# Patient Record
Sex: Female | Born: 1935 | Race: White | Hispanic: No | Marital: Married | Smoking: Never smoker
Health system: Southern US, Community
[De-identification: ages and names within clinical notes are randomized; demographics above are authoritative.]

## PROBLEM LIST (undated history)

## (undated) DIAGNOSIS — S7290XA Unspecified fracture of unspecified femur, initial encounter for closed fracture: Secondary | ICD-10-CM

## (undated) DIAGNOSIS — J449 Chronic obstructive pulmonary disease, unspecified: Secondary | ICD-10-CM

## (undated) DIAGNOSIS — M889 Osteitis deformans of unspecified bone: Secondary | ICD-10-CM

## (undated) DIAGNOSIS — D510 Vitamin B12 deficiency anemia due to intrinsic factor deficiency: Secondary | ICD-10-CM

## (undated) DIAGNOSIS — K439 Ventral hernia without obstruction or gangrene: Secondary | ICD-10-CM

## (undated) DIAGNOSIS — S72002A Fracture of unspecified part of neck of left femur, initial encounter for closed fracture: Secondary | ICD-10-CM

## (undated) DIAGNOSIS — N289 Disorder of kidney and ureter, unspecified: Secondary | ICD-10-CM

## (undated) DIAGNOSIS — C50019 Malignant neoplasm of nipple and areola, unspecified female breast: Secondary | ICD-10-CM

## (undated) DIAGNOSIS — M810 Age-related osteoporosis without current pathological fracture: Secondary | ICD-10-CM

## (undated) DIAGNOSIS — Z8639 Personal history of other endocrine, nutritional and metabolic disease: Secondary | ICD-10-CM

## (undated) DIAGNOSIS — J189 Pneumonia, unspecified organism: Secondary | ICD-10-CM

## (undated) DIAGNOSIS — E559 Vitamin D deficiency, unspecified: Secondary | ICD-10-CM

## (undated) DIAGNOSIS — C189 Malignant neoplasm of colon, unspecified: Secondary | ICD-10-CM

## (undated) DIAGNOSIS — G20A1 Parkinson's disease without dyskinesia, without mention of fluctuations: Secondary | ICD-10-CM

## (undated) DIAGNOSIS — G20 Parkinson's disease: Secondary | ICD-10-CM

## (undated) DIAGNOSIS — J479 Bronchiectasis, uncomplicated: Secondary | ICD-10-CM

## (undated) DIAGNOSIS — F0390 Unspecified dementia without behavioral disturbance: Secondary | ICD-10-CM

## (undated) DIAGNOSIS — C801 Malignant (primary) neoplasm, unspecified: Secondary | ICD-10-CM

## (undated) HISTORY — DX: Malignant neoplasm of nipple and areola, unspecified female breast: C50.019

## (undated) HISTORY — DX: Unspecified dementia, unspecified severity, without behavioral disturbance, psychotic disturbance, mood disturbance, and anxiety: F03.90

## (undated) HISTORY — DX: Pneumonia, unspecified organism: J18.9

## (undated) HISTORY — DX: Personal history of other endocrine, nutritional and metabolic disease: Z86.39

## (undated) HISTORY — DX: Fracture of unspecified part of neck of left femur, initial encounter for closed fracture: S72.002A

## (undated) HISTORY — DX: Bronchiectasis, uncomplicated: J47.9

## (undated) HISTORY — DX: Malignant (primary) neoplasm, unspecified: C80.1

## (undated) HISTORY — DX: Age-related osteoporosis without current pathological fracture: M81.0

## (undated) HISTORY — DX: Vitamin D deficiency, unspecified: E55.9

## (undated) HISTORY — PX: COLON SURGERY: SHX602

## (undated) HISTORY — DX: Ventral hernia without obstruction or gangrene: K43.9

## (undated) HISTORY — DX: Unspecified fracture of unspecified femur, initial encounter for closed fracture: S72.90XA

## (undated) HISTORY — PX: BREAST SURGERY: SHX581

## (undated) HISTORY — DX: Parkinson's disease without dyskinesia, without mention of fluctuations: G20.A1

## (undated) HISTORY — DX: Vitamin B12 deficiency anemia due to intrinsic factor deficiency: D51.0

## (undated) HISTORY — DX: Parkinson's disease: G20

---

## 2006-10-31 ENCOUNTER — Encounter: Admission: RE | Admit: 2006-10-31 | Discharge: 2006-12-02 | Payer: Self-pay | Admitting: Internal Medicine

## 2006-12-03 ENCOUNTER — Encounter: Admission: RE | Admit: 2006-12-03 | Discharge: 2006-12-30 | Payer: Self-pay | Admitting: Internal Medicine

## 2006-12-31 ENCOUNTER — Encounter: Admission: RE | Admit: 2006-12-31 | Discharge: 2007-02-27 | Payer: Self-pay | Admitting: Internal Medicine

## 2007-04-22 ENCOUNTER — Encounter: Admission: RE | Admit: 2007-04-22 | Discharge: 2007-07-21 | Payer: Self-pay | Admitting: Neurology

## 2007-08-14 ENCOUNTER — Encounter: Admission: RE | Admit: 2007-08-14 | Discharge: 2007-08-14 | Payer: Self-pay | Admitting: Neurology

## 2008-09-16 ENCOUNTER — Encounter: Admission: RE | Admit: 2008-09-16 | Discharge: 2008-09-16 | Payer: Self-pay | Admitting: Family Medicine

## 2009-06-28 ENCOUNTER — Encounter: Admission: RE | Admit: 2009-06-28 | Discharge: 2009-07-15 | Payer: Self-pay | Admitting: Family Medicine

## 2009-07-15 ENCOUNTER — Encounter: Admission: RE | Admit: 2009-07-15 | Discharge: 2009-08-24 | Payer: Self-pay | Admitting: Family Medicine

## 2012-01-25 ENCOUNTER — Other Ambulatory Visit: Payer: Self-pay | Admitting: Family Medicine

## 2012-01-25 DIAGNOSIS — Z780 Asymptomatic menopausal state: Secondary | ICD-10-CM

## 2012-01-25 DIAGNOSIS — Z1231 Encounter for screening mammogram for malignant neoplasm of breast: Secondary | ICD-10-CM

## 2012-02-16 ENCOUNTER — Ambulatory Visit: Payer: Self-pay

## 2012-02-16 ENCOUNTER — Other Ambulatory Visit: Payer: Self-pay

## 2012-02-28 ENCOUNTER — Other Ambulatory Visit: Payer: Self-pay | Admitting: Family Medicine

## 2012-02-28 DIAGNOSIS — N63 Unspecified lump in unspecified breast: Secondary | ICD-10-CM

## 2012-02-29 ENCOUNTER — Other Ambulatory Visit: Payer: Self-pay

## 2012-02-29 ENCOUNTER — Ambulatory Visit: Payer: Self-pay

## 2012-03-07 ENCOUNTER — Ambulatory Visit
Admission: RE | Admit: 2012-03-07 | Discharge: 2012-03-07 | Disposition: A | Discharge status: Home / Self Care | Payer: Medicare Other | Source: Ambulatory Visit | Attending: Family Medicine | Admitting: Family Medicine

## 2012-03-07 ENCOUNTER — Other Ambulatory Visit: Payer: Self-pay

## 2012-03-07 DIAGNOSIS — Z780 Asymptomatic menopausal state: Secondary | ICD-10-CM

## 2012-03-07 DIAGNOSIS — N63 Unspecified lump in unspecified breast: Secondary | ICD-10-CM

## 2013-01-15 DIAGNOSIS — Z8639 Personal history of other endocrine, nutritional and metabolic disease: Secondary | ICD-10-CM | POA: Insufficient documentation

## 2013-01-15 HISTORY — DX: Personal history of other endocrine, nutritional and metabolic disease: Z86.39

## 2013-03-02 DIAGNOSIS — K439 Ventral hernia without obstruction or gangrene: Secondary | ICD-10-CM | POA: Insufficient documentation

## 2013-03-02 HISTORY — DX: Ventral hernia without obstruction or gangrene: K43.9

## 2013-03-21 ENCOUNTER — Encounter: Payer: Self-pay | Admitting: *Deleted

## 2013-03-21 ENCOUNTER — Encounter: Payer: Self-pay | Admitting: Critical Care Medicine

## 2013-03-22 ENCOUNTER — Emergency Department (HOSPITAL_COMMUNITY)
Admission: EM | Admit: 2013-03-22 | Discharge: 2013-03-22 | Disposition: A | Discharge status: Home / Self Care | Payer: Medicare Other | Attending: Emergency Medicine | Admitting: Emergency Medicine

## 2013-03-22 ENCOUNTER — Encounter (HOSPITAL_COMMUNITY): Payer: Self-pay

## 2013-03-22 ENCOUNTER — Emergency Department (HOSPITAL_COMMUNITY): Payer: Medicare Other

## 2013-03-22 DIAGNOSIS — J449 Chronic obstructive pulmonary disease, unspecified: Secondary | ICD-10-CM | POA: Insufficient documentation

## 2013-03-22 DIAGNOSIS — Z8719 Personal history of other diseases of the digestive system: Secondary | ICD-10-CM | POA: Insufficient documentation

## 2013-03-22 DIAGNOSIS — Z862 Personal history of diseases of the blood and blood-forming organs and certain disorders involving the immune mechanism: Secondary | ICD-10-CM | POA: Insufficient documentation

## 2013-03-22 DIAGNOSIS — Z859 Personal history of malignant neoplasm, unspecified: Secondary | ICD-10-CM | POA: Insufficient documentation

## 2013-03-22 DIAGNOSIS — F0280 Dementia in other diseases classified elsewhere without behavioral disturbance: Secondary | ICD-10-CM | POA: Insufficient documentation

## 2013-03-22 DIAGNOSIS — Z79899 Other long term (current) drug therapy: Secondary | ICD-10-CM | POA: Insufficient documentation

## 2013-03-22 DIAGNOSIS — R531 Weakness: Secondary | ICD-10-CM

## 2013-03-22 DIAGNOSIS — Z8639 Personal history of other endocrine, nutritional and metabolic disease: Secondary | ICD-10-CM | POA: Insufficient documentation

## 2013-03-22 DIAGNOSIS — R5383 Other fatigue: Secondary | ICD-10-CM

## 2013-03-22 DIAGNOSIS — IMO0002 Reserved for concepts with insufficient information to code with codable children: Secondary | ICD-10-CM | POA: Insufficient documentation

## 2013-03-22 DIAGNOSIS — J4489 Other specified chronic obstructive pulmonary disease: Secondary | ICD-10-CM | POA: Insufficient documentation

## 2013-03-22 DIAGNOSIS — G20A1 Parkinson's disease without dyskinesia, without mention of fluctuations: Secondary | ICD-10-CM | POA: Insufficient documentation

## 2013-03-22 DIAGNOSIS — R5381 Other malaise: Secondary | ICD-10-CM | POA: Insufficient documentation

## 2013-03-22 DIAGNOSIS — M810 Age-related osteoporosis without current pathological fracture: Secondary | ICD-10-CM | POA: Insufficient documentation

## 2013-03-22 DIAGNOSIS — G20 Parkinson's disease: Secondary | ICD-10-CM | POA: Insufficient documentation

## 2013-03-22 HISTORY — DX: Chronic obstructive pulmonary disease, unspecified: J44.9

## 2013-03-22 LAB — BASIC METABOLIC PANEL
BUN: 17 mg/dL (ref 6–23)
Calcium: 8.9 mg/dL (ref 8.4–10.5)
GFR calc non Af Amer: 60 mL/min — ABNORMAL LOW (ref >90)
Glucose, Bld: 102 mg/dL — ABNORMAL HIGH (ref 70–99)

## 2013-03-22 LAB — URINALYSIS, ROUTINE W REFLEX MICROSCOPIC
Bilirubin Urine: NEGATIVE
Hgb urine dipstick: NEGATIVE
Specific Gravity, Urine: 1.019 (ref 1.005–1.030)
Urobilinogen, UA: 0.2 mg/dL (ref 0.0–1.0)
pH: 6 (ref 5.0–8.0)

## 2013-03-22 LAB — CBC WITH DIFFERENTIAL/PLATELET
Eosinophils Absolute: 0 10*3/uL (ref 0.0–0.7)
Eosinophils Relative: 0 % (ref 0–5)
Hemoglobin: 11.2 g/dL — ABNORMAL LOW (ref 12.0–15.0)
Lymphs Abs: 1 10*3/uL (ref 0.7–4.0)
MCH: 28.1 pg (ref 26.0–34.0)
MCV: 85.4 fL (ref 78.0–100.0)
Monocytes Relative: 9 % (ref 3–12)
RBC: 3.98 MIL/uL (ref 3.87–5.11)

## 2013-03-22 NOTE — ED Notes (Signed)
Patient transported to X-ray 

## 2013-03-22 NOTE — ED Notes (Signed)
Pt escorted to discharge window. Verbalized understanding discharge instructions. In no acute distress. Vitals reviewed and WDL.  

## 2013-03-22 NOTE — ED Notes (Signed)
Bed: WA21 Expected date:  Expected time:  Means of arrival:  Comments: Room 21 AMS

## 2013-03-22 NOTE — ED Provider Notes (Signed)
CSN: [id number]     Arrival date & time 03/22/13  1305 History   First MD Initiated Contact with Patient 03/22/13 1402     Chief Complaint  Patient presents with  . Altered Mental Status   (Consider location/radiation/quality/duration/timing/severity/associated sxs/prior Treatment) HPI Comments: Patient with a history of Parkinson's Disease presents today with a chief complaint of fatigue.  Her husband reports that she has been feeling fatigued daily every morning for the past 2-3 weeks.  He reports that usually her fatigue improves around noon.  However, today her fatigue and generalized weakness continued.  Husband reports that he has not noticed any confusion.  Patient denies any pain at this time.  Patient denies headache.  No focal weakness.  No numbness or tingling.  No difficulty speaking or difficulty swallowing.  No nausea, vomiting, or diarrhea.  No fever or chills.  She has had an occasional cough for the past several weeks.  She denies SOB or CP.  She is currently followed by Neurology for her Parkinson's.  She is currently on Stalevo, which husband feels may be contributing to her fatigue.    The history is provided by the patient.    Past Medical History  Diagnosis Date  . Cancer   . Parkinson's disease   . Osteoporosis   . Vitamin D deficiency   . Dementia   . Ventral hernia   . Pernicious anemia   . COPD (chronic obstructive pulmonary disease)    Past Surgical History  Procedure Laterality Date  . Colon surgery      bowel resection  . Breast surgery Left    History reviewed. No pertinent family history. History  Substance Use Topics  . Smoking status: Never Smoker   . Smokeless tobacco: Never Used  . Alcohol Use: Yes     Comment: occ   OB History   Grav Para Term Preterm Abortions TAB SAB Ect Mult Living                 Review of Systems  Constitutional: Positive for fatigue.  Neurological:       Generalized weakness  All other systems reviewed and are  negative.    Allergies  Iodine; Ivp dye; and Shellfish allergy  Home Medications   Current Outpatient Rx  Name  Route  Sig  Dispense  Refill  . albuterol (PROVENTIL) (2.5 MG/3ML) 0.083% nebulizer solution   Nebulization   Take 2.5 mg by nebulization every 6 (six) hours as needed for wheezing.         . budesonide (PULMICORT) 0.5 MG/2ML nebulizer solution   Nebulization   Take 0.5 mg by nebulization 2 (two) times daily.          . carbidopa-levodopa-entacapone (STALEVO) 25-100-200 MG per tablet   Oral   Take 1 tablet by mouth 4 (four) times daily.         Marland Kitchen ENSURE PLUS (ENSURE PLUS) LIQD   Oral   Take 237 mLs by mouth daily.         . naproxen sodium (ANAPROX) 220 MG tablet   Oral   Take 220 mg by mouth daily as needed (for pain).         . rasagiline (AZILECT) 1 MG TABS tablet   Oral   Take 1 mg by mouth every morning.         . Rivastigmine (EXELON) 13.3 MG/24HR PT24   Transdermal   Place 1 patch onto the skin daily.  BP 185/73  Pulse 97  Temp(Src) 98.3 F (36.8 C) (Oral)  Resp 16  SpO2 94% Physical Exam  Nursing note and vitals reviewed. Constitutional: She is oriented to person, place, and time. She appears well-developed and well-nourished. No distress.  HENT:  Head: Normocephalic and atraumatic.  Mouth/Throat: Oropharynx is clear and moist.  Eyes: EOM are normal. Pupils are equal, round, and reactive to light.  Neck: Normal range of motion. Neck supple.  Cardiovascular: Normal rate, regular rhythm and normal heart sounds.   Pulmonary/Chest: Effort normal and breath sounds normal. No respiratory distress. She has no wheezes. She has no rales.  Abdominal: Soft. Bowel sounds are normal. She exhibits no distension and no mass. There is no tenderness. There is no rebound and no guarding.  Umbilical hernia, easily reducible.  Musculoskeletal: Normal range of motion.  Neurological: She is alert and oriented to person, place, and time. She  has normal strength. No cranial nerve deficit or sensory deficit.  Skin: Skin is warm and dry. She is not diaphoretic.  Psychiatric: She has a normal mood and affect.    ED Course  Procedures (including critical care time) Labs Review Labs Reviewed  URINALYSIS, ROUTINE W REFLEX MICROSCOPIC - Abnormal; Notable for the following:    Color, Urine AMBER (*)    All other components within normal limits  CBC WITH DIFFERENTIAL  BASIC METABOLIC PANEL   Imaging Review Dg Chest 2 View  03/22/2013   *RADIOLOGY REPORT*  Clinical Data: Cough.  Altered mental status.  CHEST - 2 VIEW  Comparison: Chest x-ray 01/30/2013.  Findings: Lung volumes appear increased.  Pruning of the pulmonary vasculature in the periphery, suggestive of underlying COPD. Coarse interstitial markings throughout the lungs bilaterally, particularly in the lung bases, similar to prior studies, favored to reflect chronic bronchitis and probable bibasilar scarring.  No definite acute consolidative airspace disease.  No pleural effusions.  No evidence of pulmonary edema.  Heart size appears mildly enlarged.  Upper mediastinal contours are within normal limits.  Atherosclerosis in the thoracic aorta.  IMPRESSION: 1.  Chronic changes of COPD redemonstrated with chronic bibasilar scarring.  No definite radiographic evidence of acute cardiopulmonary disease. 2.  Atherosclerosis.   Original Report Authenticated By: Trudie Reed, M.D.     Date: 03/22/2013  Rate: 101  Rhythm: sinus tachycardia  QRS Axis: normal  Intervals: normal  ST/T Wave abnormalities: nonspecific T wave changes  Conduction Disutrbances:none  Narrative Interpretation:   Old EKG Reviewed: none available  Patient discussed with Dr. Rubin Payor who also evaluated the patient.  Troponin not crossing over in Epic.  Checked with lab.  Troponin 0.1.    4:30 PM Reassessed patient.  She reports that her fatigue and generalized weakness has improved at this time.  Patient  sat up in bed without difficulty.    MDM  No diagnosis found. Patient with a history of Parkinson's presenting with generalized weakness and fatigue, which has been occurring daily in the morning for the past 2-3 weeks.  On exam no focal neurological deficits.  Patient afebrile.  VSS.  No ischemic changes on EKG.  Troponin negative.  CXR negative.  UA negative. Labs unremarkable.  Symptoms improved in the ED.  Feel that the patient is stable for discharge.  Patient instructed to follow up with her Neurologist.  Return precautions given.      Pascal Lux Carnuel, PA-C 03/23/13 2159

## 2013-03-22 NOTE — ED Notes (Addendum)
Per PA and MD, Pt took Stalevo(home medication).

## 2013-03-22 NOTE — ED Notes (Addendum)
Per EMS, Pt, from home, presents with altered LOC.  Pt's family sts episodes of unresponsiveness x 2 weeks.  Family sts today's episode "lasted longer than normal."  Vitals are stable.  A & Ox2.  Hx of Parkinson's and Dementia.  Recently diagnosed with COPD.        Pt sts chronic generalized pain from Parkinson's disease.  Pt take Stalevo every 6hrs.  Pt's husband sts Pt has been becoming very lethargic around 10am x 2 weeks and this normally only lasts and hour, but today "she has not come out of it."

## 2013-03-24 ENCOUNTER — Ambulatory Visit (INDEPENDENT_AMBULATORY_CARE_PROVIDER_SITE_OTHER): Payer: Medicare Other | Admitting: Critical Care Medicine

## 2013-03-24 ENCOUNTER — Encounter: Payer: Self-pay | Admitting: Critical Care Medicine

## 2013-03-24 VITALS — BP 150/70 | HR 80 | Temp 98.4°F | Ht <= 58 in | Wt 92.0 lb

## 2013-03-24 DIAGNOSIS — E559 Vitamin D deficiency, unspecified: Secondary | ICD-10-CM | POA: Insufficient documentation

## 2013-03-24 DIAGNOSIS — M810 Age-related osteoporosis without current pathological fracture: Secondary | ICD-10-CM | POA: Insufficient documentation

## 2013-03-24 DIAGNOSIS — G20 Parkinson's disease: Secondary | ICD-10-CM | POA: Insufficient documentation

## 2013-03-24 DIAGNOSIS — J479 Bronchiectasis, uncomplicated: Secondary | ICD-10-CM

## 2013-03-24 DIAGNOSIS — D510 Vitamin B12 deficiency anemia due to intrinsic factor deficiency: Secondary | ICD-10-CM | POA: Insufficient documentation

## 2013-03-24 DIAGNOSIS — F0390 Unspecified dementia without behavioral disturbance: Secondary | ICD-10-CM | POA: Insufficient documentation

## 2013-03-24 DIAGNOSIS — J449 Chronic obstructive pulmonary disease, unspecified: Secondary | ICD-10-CM

## 2013-03-24 HISTORY — DX: Bronchiectasis, uncomplicated: J47.9

## 2013-03-24 MED ORDER — FLUTTER DEVI: Status: DC

## 2013-03-24 MED ORDER — COMPRESSOR/NEBULIZER MISC: Status: DC

## 2013-03-24 MED ORDER — BUDESONIDE 0.5 MG/2ML IN SUSP: 0.5000 mg | Freq: Every day | RESPIRATORY_TRACT | Status: DC

## 2013-03-24 MED ORDER — LEVOFLOXACIN 500 MG PO TABS: 500.0000 mg | ORAL_TABLET | Freq: Every day | ORAL | Status: DC

## 2013-03-24 MED ORDER — ALBUTEROL SULFATE (2.5 MG/3ML) 0.083% IN NEBU: INHALATION_SOLUTION | RESPIRATORY_TRACT | Status: DC

## 2013-03-24 NOTE — Progress Notes (Signed)
Subjective:    Patient ID: Shannon Dunn, female    DOB: 10-30-1923, 79 y.o.   MRN: [id number]  HPI Comments: Pt sees [facility] and needs a pulm md.   Dx bronchiectasis lifelong.   Chronic cough>> worse x 2weeks, rx pred. Has had prior episodes of hemoptysis long ago   Cough This is a chronic problem. The current episode started more than 1 year ago. The problem has been gradually worsening. The problem occurs hourly. The cough is productive of sputum and productive of purulent sputum. Associated symptoms include rhinorrhea. Pertinent negatives include no chest pain, chills, fever, headaches, hemoptysis, nasal congestion, postnasal drip, rash, sore throat, shortness of breath or wheezing. She has tried steroid inhaler and a beta-agonist inhaler for the symptoms. The treatment provided moderate relief. Her past medical history is significant for bronchiectasis. There is no history of asthma, bronchitis, COPD, emphysema, environmental allergies or pneumonia.    Past Medical History  Diagnosis Date  . Cancer   . Parkinson's disease   . Osteoporosis   . Vitamin D deficiency   . Dementia   . Ventral hernia   . Pernicious anemia   . COPD (chronic obstructive pulmonary disease)      Family History  Problem Relation Age of Onset  . Heart disease Mother      History   Social History  . Marital Status: Married    Spouse Name: N/A    Number of Children: N/A  . Years of Education: N/A   Occupational History  . Retired     [place]   Social History Main Topics  . Smoking status: Never Smoker   . Smokeless tobacco: Never Used  . Alcohol Use: Yes     Comment: glass of wine maybe once a week  . Drug Use: No  . Sexual Activity: Not on file   Other Topics Concern  . Not on file   Social History Narrative  . No narrative on file     Allergies  Allergen Reactions  . Iodine Other (See Comments)    Unknown, might be rash/hives  . Ivp Dye [Iodinated Diagnostic Agents] Other  (See Comments)    Unknown rash/hives maybe  . Shellfish Allergy Other (See Comments)    Unknown rash/hives, scallops      Outpatient Prescriptions Prior to Visit  Medication Sig Dispense Refill  . carbidopa-levodopa-entacapone (STALEVO) 25-100-200 MG per tablet Take 1 tablet by mouth 4 (four) times daily.      Marland Kitchen ENSURE PLUS (ENSURE PLUS) LIQD Take 237 mLs by mouth daily.      . naproxen sodium (ANAPROX) 220 MG tablet Take 220 mg by mouth daily as needed (for pain).      . rasagiline (AZILECT) 1 MG TABS tablet Take 1 mg by mouth every morning.      . Rivastigmine (EXELON) 13.3 MG/24HR PT24 Place 1 patch onto the skin daily.      Marland Kitchen albuterol (PROVENTIL) (2.5 MG/3ML) 0.083% nebulizer solution Take 2.5 mg by nebulization every 6 (six) hours as needed for wheezing.      . budesonide (PULMICORT) 0.5 MG/2ML nebulizer solution Take 0.5 mg by nebulization. 1-2 times daily       No facility-administered medications prior to visit.      Review of Systems  Constitutional: Positive for diaphoresis, activity change and unexpected weight change. Negative for fever, chills and fatigue.  HENT: Positive for congestion, rhinorrhea and voice change. Negative for hearing loss, nosebleeds, sore throat, facial swelling, mouth sores,  neck pain, neck stiffness, dental problem, postnasal drip, sinus pressure, tinnitus and ear discharge.   Eyes: Negative for photophobia, discharge, itching and visual disturbance.  Respiratory: Positive for cough. Negative for apnea, hemoptysis, choking, chest tightness, shortness of breath, wheezing and stridor.   Cardiovascular: Positive for leg swelling. Negative for chest pain and palpitations.  Gastrointestinal: Negative for nausea, vomiting, abdominal pain, constipation, blood in stool and abdominal distention.       No coughing after eating No heartburn  Genitourinary: Negative for dysuria, urgency, frequency, hematuria, flank pain, decreased urine volume and difficulty  urinating.  Musculoskeletal: Positive for gait problem. Negative for back pain, joint swelling and arthralgias.  Skin: Negative for color change, pallor and rash.  Allergic/Immunologic: Negative for environmental allergies.  Neurological: Positive for dizziness, weakness and light-headedness. Negative for tremors, seizures, syncope, speech difficulty, numbness and headaches.  Hematological: Negative for adenopathy. Bruises/bleeds easily.  Psychiatric/Behavioral: Negative for confusion, sleep disturbance and agitation. The patient is not nervous/anxious.        Objective:   Physical Exam Filed Vitals:   03/24/13 1625  BP: 150/70  Pulse: 80  Temp: 98.4 F (36.9 C)  TempSrc: Oral  Height: 4\' 10"  (1.473 m)  Weight: 92 lb (41.731 kg)  SpO2: 96%    Gen: Elderly white female in no distress  ENT: No lesions,  mouth clear,  oropharynx clear, no postnasal drip  Neck: No JVD, no TMG, no carotid bruits  Lungs: No use of accessory muscles, no dullness to percussion, Scattered rhonchi and expired wheezes  Cardiovascular: RRR, heart sounds normal, no murmur or gallops, no peripheral edema  Abdomen: soft and NT, no HSM,  BS normal  Musculoskeletal: No deformities, no cyanosis or clubbing  Neuro: alert, non focal  Skin: Warm, no lesions or rashes  No results found.        Assessment & Plan:   Bronchiectasis without acute exacerbation History of cylindrical bronchiectasis in both lower lobes with associated COPD Acute exacerbation of same Plan Take levaquin 500mg  daily for 7days Stay on budesonide daily along with albuterol daily Start on a flutter valve daily Obtain a sputum culture No other changes We will obtain your own nebulizer and medications for nebulizer Return 2 months    Updated Medication List Outpatient Encounter Prescriptions as of 03/24/2013  Medication Sig Dispense Refill  . albuterol (PROVENTIL) (2.5 MG/3ML) 0.083% nebulizer solution Use once daily and  as needed  120 mL  6  . budesonide (PULMICORT) 0.5 MG/2ML nebulizer solution Take 2 mLs (0.5 mg total) by nebulization daily.  60 mL  6  . carbidopa-levodopa-entacapone (STALEVO) 25-100-200 MG per tablet Take 1 tablet by mouth 4 (four) times daily.      . cyanocobalamin (,VITAMIN B-12,) 1000 MCG/ML injection every 30 (thirty) days.      Marland Kitchen denosumab (PROLIA) 60 MG/ML SOLN injection Inject 60 mg into the skin every 6 (six) months. Administer in upper arm, thigh, or abdomen      . ENSURE PLUS (ENSURE PLUS) LIQD Take 237 mLs by mouth daily.      . naproxen sodium (ANAPROX) 220 MG tablet Take 220 mg by mouth daily as needed (for pain).      . rasagiline (AZILECT) 1 MG TABS tablet Take 1 mg by mouth every morning.      . Rivastigmine (EXELON) 13.3 MG/24HR PT24 Place 1 patch onto the skin daily.      . [DISCONTINUED] albuterol (PROVENTIL) (2.5 MG/3ML) 0.083% nebulizer solution Take 2.5 mg by nebulization  every 6 (six) hours as needed for wheezing.      . [DISCONTINUED] albuterol (PROVENTIL) (2.5 MG/3ML) 0.083% nebulizer solution Use once daily and as needed  120 mL  6  . [DISCONTINUED] budesonide (PULMICORT) 0.5 MG/2ML nebulizer solution Take 0.5 mg by nebulization. 1-2 times daily      . [DISCONTINUED] budesonide (PULMICORT) 0.5 MG/2ML nebulizer solution Take 2 mLs (0.5 mg total) by nebulization daily. 1-2 times daily  60 mL  6  . levofloxacin (LEVAQUIN) 500 MG tablet Take 1 tablet (500 mg total) by mouth daily.  7 tablet  0  . Nebulizers (COMPRESSOR/NEBULIZER) MISC Use with albuterol/budesonide  1 each  0  . Respiratory Therapy Supplies (FLUTTER) DEVI Use 4 times daily  1 each  0  . [DISCONTINUED] Nebulizers (COMPRESSOR/NEBULIZER) MISC Use with albuterol/budesonide  1 each  0   No facility-administered encounter medications on file as of 03/24/2013.

## 2013-03-24 NOTE — Patient Instructions (Addendum)
Take levaquin 500mg  daily for 7days Stay on budesonide daily along with albuterol daily Start on a flutter valve daily Obtain a sputum culture No other changes We will obtain your own nebulizer and medications for nebulizer Return 2 months

## 2013-03-25 ENCOUNTER — Other Ambulatory Visit: Payer: Self-pay | Admitting: Critical Care Medicine

## 2013-03-25 DIAGNOSIS — J449 Chronic obstructive pulmonary disease, unspecified: Secondary | ICD-10-CM

## 2013-03-25 NOTE — Assessment & Plan Note (Signed)
History of cylindrical bronchiectasis in both lower lobes with associated COPD Acute exacerbation of same Plan Take levaquin 500mg  daily for 7days Stay on budesonide daily along with albuterol daily Start on a flutter valve daily Obtain a sputum culture No other changes We will obtain your own nebulizer and medications for nebulizer Return 2 months

## 2013-03-26 NOTE — ED Provider Notes (Signed)
Medical screening examination/treatment/procedure(s) were performed by non-physician practitioner and as supervising physician I was immediately available for consultation/collaboration.  Timo Hartwig R. Javian Nudd, MD 03/26/13 2346 

## 2013-05-12 ENCOUNTER — Ambulatory Visit (INDEPENDENT_AMBULATORY_CARE_PROVIDER_SITE_OTHER): Payer: Medicare Other | Admitting: Critical Care Medicine

## 2013-05-12 ENCOUNTER — Encounter: Payer: Self-pay | Admitting: Critical Care Medicine

## 2013-05-12 VITALS — BP 128/64 | HR 87 | Temp 98.0°F | Ht <= 58 in | Wt 91.0 lb

## 2013-05-12 DIAGNOSIS — J479 Bronchiectasis, uncomplicated: Secondary | ICD-10-CM

## 2013-05-12 MED ORDER — BUDESONIDE 0.5 MG/2ML IN SUSP: 0.5000 mg | Freq: Every day | RESPIRATORY_TRACT | Status: DC

## 2013-05-12 NOTE — Patient Instructions (Signed)
Use flutter valve 3- 4 times daily Stay on nebulizer medications Return 4 months

## 2013-05-13 NOTE — Progress Notes (Signed)
Subjective:    Patient ID: Shannon Dunn, female    DOB: 17-Jun-1924, 79 y.o.   MRN: [id number]  HPI Comments: Pt sees [facility] and needs a pulm md.   Dx bronchiectasis lifelong.   Chronic cough>> worse x 2weeks, rx pred. Has had prior episodes of hemoptysis long ago   05/13/2013  Chief Complaint  Patient presents with  . 2 month follow up    Breathing has improved.  Not coughing as much - prod at times with yellowish to green mucus.  No wheezing, chest tightness, or chest pain.  No new changes are noted the patient does have some difficulty raising secretions but is coughing less and breathing has improved on inhaled medication program is been no further hemoptysis  Past Medical History  Diagnosis Date  . Cancer   . Parkinson's disease   . Osteoporosis   . Vitamin D deficiency   . Dementia   . Ventral hernia   . Pernicious anemia   . COPD (chronic obstructive pulmonary disease)      Family History  Problem Relation Age of Onset  . Heart disease Mother      History   Social History  . Marital Status: Married    Spouse Name: N/A    Number of Children: N/A  . Years of Education: N/A   Occupational History  . Retired     [place]   Social History Main Topics  . Smoking status: Never Smoker   . Smokeless tobacco: Never Used  . Alcohol Use: Yes     Comment: glass of wine maybe once a week  . Drug Use: No  . Sexual Activity: Not on file   Other Topics Concern  . Not on file   Social History Narrative  . No narrative on file     Allergies  Allergen Reactions  . Iodine Other (See Comments)    Unknown, might be rash/hives  . Ivp Dye [Iodinated Diagnostic Agents] Other (See Comments)    Unknown rash/hives maybe  . Shellfish Allergy Other (See Comments)    Unknown rash/hives, scallops      Outpatient Prescriptions Prior to Visit  Medication Sig Dispense Refill  . albuterol (PROVENTIL) (2.5 MG/3ML) 0.083% nebulizer solution Use once daily and as needed   120 mL  6  . carbidopa-levodopa-entacapone (STALEVO) 25-100-200 MG per tablet Take 1 tablet by mouth 4 (four) times daily.      . cyanocobalamin (,VITAMIN B-12,) 1000 MCG/ML injection every 30 (thirty) days.      Marland Kitchen denosumab (PROLIA) 60 MG/ML SOLN injection Inject 60 mg into the skin every 6 (six) months. Administer in upper arm, thigh, or abdomen      . ENSURE PLUS (ENSURE PLUS) LIQD Take 237 mLs by mouth daily.      . naproxen sodium (ANAPROX) 220 MG tablet Take 220 mg by mouth daily as needed (for pain).      . Nebulizers (COMPRESSOR/NEBULIZER) MISC Use with albuterol/budesonide  1 each  0  . rasagiline (AZILECT) 1 MG TABS tablet Take 1 mg by mouth every morning.      . Rivastigmine (EXELON) 13.3 MG/24HR PT24 Place 1 patch onto the skin daily.      . budesonide (PULMICORT) 0.5 MG/2ML nebulizer solution Take 2 mLs (0.5 mg total) by nebulization daily.  60 mL  6  . Respiratory Therapy Supplies (FLUTTER) DEVI Use 4 times daily  1 each  0  . levofloxacin (LEVAQUIN) 500 MG tablet Take 1 tablet (500 mg  total) by mouth daily.  7 tablet  0   No facility-administered medications prior to visit.      Review of Systems  Constitutional: Positive for diaphoresis, activity change and unexpected weight change. Negative for fatigue.  HENT: Positive for congestion and voice change. Negative for dental problem, ear discharge, facial swelling, hearing loss, mouth sores, nosebleeds, sinus pressure and tinnitus.   Eyes: Negative for photophobia, discharge, itching and visual disturbance.  Respiratory: Negative for apnea, choking, chest tightness and stridor.   Cardiovascular: Positive for leg swelling. Negative for palpitations.  Gastrointestinal: Negative for nausea, vomiting, abdominal pain, constipation, blood in stool and abdominal distention.       No coughing after eating No heartburn  Genitourinary: Negative for dysuria, urgency, frequency, hematuria, flank pain, decreased urine volume and difficulty  urinating.  Musculoskeletal: Positive for gait problem. Negative for arthralgias, back pain, joint swelling, neck pain and neck stiffness.  Skin: Negative for color change and pallor.  Neurological: Positive for dizziness, weakness and light-headedness. Negative for tremors, seizures, syncope, speech difficulty and numbness.  Hematological: Negative for adenopathy. Bruises/bleeds easily.  Psychiatric/Behavioral: Negative for confusion, sleep disturbance and agitation. The patient is not nervous/anxious.        Objective:   Physical Exam  Filed Vitals:   05/12/13 1614  BP: 128/64  Pulse: 87  Temp: 98 F (36.7 C)  TempSrc: Oral  Height: 4\' 10"  (1.473 m)  Weight: 91 lb (41.277 kg)  SpO2: 96%    Gen: Elderly white female in no distress  ENT: No lesions,  mouth clear,  oropharynx clear, no postnasal drip  Neck: No JVD, no TMG, no carotid bruits  Lungs: No use of accessory muscles, no dullness to percussion, Scattered rhonchi no wheezes Cardiovascular: RRR, heart sounds normal, no murmur or gallops, no peripheral edema  Abdomen: soft and NT, no HSM,  BS normal  Musculoskeletal: No deformities, no cyanosis or clubbing  Neuro: alert, non focal  Skin: Warm, no lesions or rashes  No results found.        Assessment & Plan:   Bronchiectasis without acute exacerbation Bronchiectasis without acute exacerbation Ongoing mucus production Plan Use flutter valve 3- 4 times daily Stay on nebulizer medications Return 4 months     Updated Medication List Outpatient Encounter Prescriptions as of 05/12/2013  Medication Sig Dispense Refill  . albuterol (PROVENTIL) (2.5 MG/3ML) 0.083% nebulizer solution Use once daily and as needed  120 mL  6  . budesonide (PULMICORT) 0.5 MG/2ML nebulizer solution Take 2 mLs (0.5 mg total) by nebulization daily.  60 mL  6  . carbidopa-levodopa-entacapone (STALEVO) 25-100-200 MG per tablet Take 1 tablet by mouth 4 (four) times daily.      .  cyanocobalamin (,VITAMIN B-12,) 1000 MCG/ML injection every 30 (thirty) days.      Marland Kitchen denosumab (PROLIA) 60 MG/ML SOLN injection Inject 60 mg into the skin every 6 (six) months. Administer in upper arm, thigh, or abdomen      . ENSURE PLUS (ENSURE PLUS) LIQD Take 237 mLs by mouth daily.      . naproxen sodium (ANAPROX) 220 MG tablet Take 220 mg by mouth daily as needed (for pain).      . Nebulizers (COMPRESSOR/NEBULIZER) MISC Use with albuterol/budesonide  1 each  0  . rasagiline (AZILECT) 1 MG TABS tablet Take 1 mg by mouth every morning.      . Rivastigmine (EXELON) 13.3 MG/24HR PT24 Place 1 patch onto the skin daily.      Marland Kitchen  Rotigotine (NEUPRO TD) Place onto the skin daily.      . [DISCONTINUED] budesonide (PULMICORT) 0.5 MG/2ML nebulizer solution Take 2 mLs (0.5 mg total) by nebulization daily.  60 mL  6  . Respiratory Therapy Supplies (FLUTTER) DEVI Use 4 times daily  1 each  0  . [DISCONTINUED] levofloxacin (LEVAQUIN) 500 MG tablet Take 1 tablet (500 mg total) by mouth daily.  7 tablet  0   No facility-administered encounter medications on file as of 05/12/2013.

## 2013-05-13 NOTE — Assessment & Plan Note (Signed)
Bronchiectasis without acute exacerbation Ongoing mucus production Plan Use flutter valve 3- 4 times daily Stay on nebulizer medications Return 4 months

## 2013-09-16 ENCOUNTER — Ambulatory Visit: Payer: Medicare Other | Admitting: Critical Care Medicine

## 2013-10-14 ENCOUNTER — Encounter: Payer: Self-pay | Admitting: Critical Care Medicine

## 2013-10-14 ENCOUNTER — Ambulatory Visit (INDEPENDENT_AMBULATORY_CARE_PROVIDER_SITE_OTHER): Payer: Medicare Other | Admitting: Critical Care Medicine

## 2013-10-14 VITALS — BP 100/60 | HR 93 | Temp 97.8°F | Ht 59.0 in | Wt 93.8 lb

## 2013-10-14 DIAGNOSIS — J479 Bronchiectasis, uncomplicated: Secondary | ICD-10-CM

## 2013-10-14 MED ORDER — FLUTICASONE PROPIONATE 50 MCG/ACT NA SUSP: 2.0000 | Freq: Every day | NASAL | Status: DC

## 2013-10-14 NOTE — Progress Notes (Signed)
Subjective:    Patient ID: Shannon Dunn, female    DOB: September 25, 1923, 80 y.o.   MRN: [id number]  HPI Comments: Pt sees Darron Doom and needs a pulm md.   Dx bronchiectasis lifelong.   Chronic cough>> worse x 2weeks, rx pred. Has had prior episodes of hemoptysis long ago   05/13/2013  Chief Complaint  Patient presents with  . 2 month follow up    Breathing has improved.  Not coughing as much - prod at times with yellowish to green mucus.  No wheezing, chest tightness, or chest pain.  No new changes are noted the patient does have some difficulty raising secretions but is coughing less and breathing has improved on inhaled medication program is been no further hemoptysis  10/14/2013 Chief Complaint  Patient presents with  . Follow-up    4 mth f/u - Hoarseness - Prod cough (yellow) - Nasal drainage (yellow) - Occas sob - Throat irritation - Strangles on food  Notes some hoarsness, and some post nasal drip.  Did choke on food, but not frequent.       Past Medical History  Diagnosis Date  . Cancer   . Parkinson's disease   . Osteoporosis   . Vitamin D deficiency   . Dementia   . Ventral hernia   . Pernicious anemia   . COPD (chronic obstructive pulmonary disease)      Family History  Problem Relation Age of Onset  . Heart disease Mother      History   Social History  . Marital Status: Married    Spouse Name: N/A    Number of Children: N/A  . Years of Education: N/A   Occupational History  . Retired     [employer]   Social History Main Topics  . Smoking status: Never Smoker   . Smokeless tobacco: Never Used  . Alcohol Use: Yes     Comment: glass of wine maybe once a week  . Drug Use: No  . Sexual Activity: Not on file   Other Topics Concern  . Not on file   Social History Narrative  . No narrative on file     Allergies  Allergen Reactions  . Iodine Other (See Comments)    Unknown, might be rash/hives  . Ivp Dye [Iodinated Diagnostic Agents] Other (See  Comments)    Unknown rash/hives maybe  . Shellfish Allergy Other (See Comments)    Unknown rash/hives, scallops      Outpatient Prescriptions Prior to Visit  Medication Sig Dispense Refill  . albuterol (PROVENTIL) (2.5 MG/3ML) 0.083% nebulizer solution Use once daily and as needed  120 mL  6  . budesonide (PULMICORT) 0.5 MG/2ML nebulizer solution Take 2 mLs (0.5 mg total) by nebulization daily.  60 mL  6  . carbidopa-levodopa-entacapone (STALEVO) 25-100-200 MG per tablet Take 1 tablet by mouth 4 (four) times daily.      . cyanocobalamin (,VITAMIN B-12,) 1000 MCG/ML injection every 30 (thirty) days.      Marland Kitchen denosumab (PROLIA) 60 MG/ML SOLN injection Inject 60 mg into the skin every 6 (six) months. Administer in upper arm, thigh, or abdomen      . ENSURE PLUS (ENSURE PLUS) LIQD Take 237 mLs by mouth daily.      . Nebulizers (COMPRESSOR/NEBULIZER) MISC Use with albuterol/budesonide  1 each  0  . rasagiline (AZILECT) 1 MG TABS tablet Take 1 mg by mouth every morning.      Marland Kitchen Respiratory Therapy Supplies (FLUTTER) DEVI Use 4  times daily  1 each  0  . Rivastigmine (EXELON) 13.3 MG/24HR PT24 Place 1 patch onto the skin daily.      . Rotigotine (NEUPRO TD) Place onto the skin daily.      . naproxen sodium (ANAPROX) 220 MG tablet Take 220 mg by mouth daily as needed (for pain).       No facility-administered medications prior to visit.      Review of Systems  Constitutional: Positive for diaphoresis, activity change and unexpected weight change. Negative for fatigue.  HENT: Positive for congestion and voice change. Negative for dental problem, ear discharge, facial swelling, hearing loss, mouth sores, nosebleeds, sinus pressure and tinnitus.   Eyes: Negative for photophobia, discharge, itching and visual disturbance.  Respiratory: Negative for apnea, choking, chest tightness and stridor.   Cardiovascular: Positive for leg swelling. Negative for palpitations.  Gastrointestinal: Negative for  nausea, vomiting, abdominal pain, constipation, blood in stool and abdominal distention.       No coughing after eating No heartburn  Genitourinary: Negative for dysuria, urgency, frequency, hematuria, flank pain, decreased urine volume and difficulty urinating.  Musculoskeletal: Positive for gait problem. Negative for arthralgias, back pain, joint swelling, neck pain and neck stiffness.  Skin: Negative for color change and pallor.  Neurological: Positive for dizziness, weakness and light-headedness. Negative for tremors, seizures, syncope, speech difficulty and numbness.  Hematological: Negative for adenopathy. Bruises/bleeds easily.  Psychiatric/Behavioral: Negative for confusion, sleep disturbance and agitation. The patient is not nervous/anxious.        Objective:   Physical Exam  Filed Vitals:   10/14/13 1159  BP: 100/60  Pulse: 93  Temp: 97.8 F (36.6 C)  TempSrc: Oral  Height: 4\' 11"  (1.499 m)  Weight: 93 lb 12.8 oz (42.547 kg)  SpO2: 99%    Gen: Elderly white female in no distress  ENT: No lesions,  mouth clear,  oropharynx clear, no postnasal drip  Neck: No JVD, no TMG, no carotid bruits  Lungs: No use of accessory muscles, no dullness to percussion, Scattered rhonchi no wheezes Cardiovascular: RRR, heart sounds normal, no murmur or gallops, no peripheral edema  Abdomen: soft and NT, no HSM,  BS normal  Musculoskeletal: No deformities, no cyanosis or clubbing  Neuro: alert, non focal  Skin: Warm, no lesions or rashes  No results found.        Assessment & Plan:   Bronchiectasis without acute exacerbation Stable bronchiectasis Cont ICS Use nasal steroid    Updated Medication List Outpatient Encounter Prescriptions as of 10/14/2013  Medication Sig  . albuterol (PROVENTIL) (2.5 MG/3ML) 0.083% nebulizer solution Use once daily and as needed  . budesonide (PULMICORT) 0.5 MG/2ML nebulizer solution Take 2 mLs (0.5 mg total) by nebulization daily.  .  carbidopa-levodopa-entacapone (STALEVO) 25-100-200 MG per tablet Take 1 tablet by mouth 4 (four) times daily.  . cyanocobalamin (,VITAMIN B-12,) 1000 MCG/ML injection every 30 (thirty) days.  Marland Kitchen denosumab (PROLIA) 60 MG/ML SOLN injection Inject 60 mg into the skin every 6 (six) months. Administer in upper arm, thigh, or abdomen  . ENSURE PLUS (ENSURE PLUS) LIQD Take 237 mLs by mouth daily.  . Nebulizers (COMPRESSOR/NEBULIZER) MISC Use with albuterol/budesonide  . rasagiline (AZILECT) 1 MG TABS tablet Take 1 mg by mouth every morning.  Marland Kitchen Respiratory Therapy Supplies (FLUTTER) DEVI Use 4 times daily  . Rivastigmine (EXELON) 13.3 MG/24HR PT24 Place 1 patch onto the skin daily.  . Rotigotine (NEUPRO TD) Place onto the skin daily.  . fluticasone (FLONASE) 50  MCG/ACT nasal spray Place 2 sprays into both nostrils daily.  . naproxen sodium (ANAPROX) 220 MG tablet Take 220 mg by mouth daily as needed (for pain).

## 2013-10-14 NOTE — Patient Instructions (Signed)
Try fluticasone two puff ea nostril daily as needed, sent to pharmacy Stay on budesonide daily by nebulizer Return 6 months

## 2013-10-15 NOTE — Assessment & Plan Note (Signed)
Stable bronchiectasis Cont ICS Use nasal steroid

## 2014-02-22 ENCOUNTER — Encounter (HOSPITAL_COMMUNITY): Payer: Self-pay | Admitting: Emergency Medicine

## 2014-02-22 ENCOUNTER — Emergency Department (HOSPITAL_COMMUNITY)
Admission: EM | Admit: 2014-02-22 | Discharge: 2014-02-22 | Disposition: A | Discharge status: Home / Self Care | Payer: Medicare Other | Attending: Emergency Medicine | Admitting: Emergency Medicine

## 2014-02-22 DIAGNOSIS — J449 Chronic obstructive pulmonary disease, unspecified: Secondary | ICD-10-CM | POA: Insufficient documentation

## 2014-02-22 DIAGNOSIS — M810 Age-related osteoporosis without current pathological fracture: Secondary | ICD-10-CM | POA: Insufficient documentation

## 2014-02-22 DIAGNOSIS — T6391XA Toxic effect of contact with unspecified venomous animal, accidental (unintentional), initial encounter: Secondary | ICD-10-CM | POA: Insufficient documentation

## 2014-02-22 DIAGNOSIS — E559 Vitamin D deficiency, unspecified: Secondary | ICD-10-CM | POA: Insufficient documentation

## 2014-02-22 DIAGNOSIS — IMO0002 Reserved for concepts with insufficient information to code with codable children: Secondary | ICD-10-CM | POA: Insufficient documentation

## 2014-02-22 DIAGNOSIS — IMO0001 Reserved for inherently not codable concepts without codable children: Secondary | ICD-10-CM | POA: Diagnosis not present

## 2014-02-22 DIAGNOSIS — G20 Parkinson's disease: Secondary | ICD-10-CM | POA: Diagnosis not present

## 2014-02-22 DIAGNOSIS — Y929 Unspecified place or not applicable: Secondary | ICD-10-CM | POA: Insufficient documentation

## 2014-02-22 DIAGNOSIS — Z8719 Personal history of other diseases of the digestive system: Secondary | ICD-10-CM | POA: Insufficient documentation

## 2014-02-22 DIAGNOSIS — R4182 Altered mental status, unspecified: Secondary | ICD-10-CM | POA: Diagnosis present

## 2014-02-22 DIAGNOSIS — Z79899 Other long term (current) drug therapy: Secondary | ICD-10-CM | POA: Diagnosis not present

## 2014-02-22 DIAGNOSIS — D510 Vitamin B12 deficiency anemia due to intrinsic factor deficiency: Secondary | ICD-10-CM | POA: Diagnosis not present

## 2014-02-22 DIAGNOSIS — T63484A Toxic effect of venom of other arthropod, undetermined, initial encounter: Secondary | ICD-10-CM

## 2014-02-22 DIAGNOSIS — Z8659 Personal history of other mental and behavioral disorders: Secondary | ICD-10-CM | POA: Diagnosis not present

## 2014-02-22 DIAGNOSIS — J4489 Other specified chronic obstructive pulmonary disease: Secondary | ICD-10-CM | POA: Insufficient documentation

## 2014-02-22 DIAGNOSIS — G20A1 Parkinson's disease without dyskinesia, without mention of fluctuations: Secondary | ICD-10-CM | POA: Insufficient documentation

## 2014-02-22 DIAGNOSIS — Y939 Activity, unspecified: Secondary | ICD-10-CM | POA: Insufficient documentation

## 2014-02-22 DIAGNOSIS — R404 Transient alteration of awareness: Secondary | ICD-10-CM

## 2014-02-22 MED ORDER — PREDNISONE 20 MG PO TABS
20.0000 mg | ORAL_TABLET | ORAL | Status: AC
Start: 2014-02-22 — End: 2014-02-22
  Administered 2014-02-22: 20 mg via ORAL

## 2014-02-22 MED ORDER — PREDNISONE 20 MG PO TABS
20.0000 mg | ORAL_TABLET | Freq: Every day | ORAL | Status: DC
  Filled 2014-02-22: qty 1

## 2014-02-22 MED ORDER — PREDNISONE 20 MG PO TABS: 20.0000 mg | ORAL_TABLET | Freq: Every day | ORAL | Status: DC

## 2014-02-22 NOTE — ED Notes (Signed)
Bed: WA10 Expected date: 02/22/14 Expected time: 4:44 PM Means of arrival: Ambulance Comments: Insect bite, draining, altered mental status after benadryl

## 2014-02-22 NOTE — ED Notes (Signed)
Family at bedside. 

## 2014-02-22 NOTE — ED Notes (Signed)
MD at bedside. 

## 2014-02-22 NOTE — Discharge Instructions (Signed)
Do not use Benadryl. Elevate the right arm is much as possible.    Bee, Wasp, or Hornet Sting Your caregiver has diagnosed you as having an insect sting. An insect sting appears as a red lump in the skin that sometimes has a tiny hole in the center, or it may have a stinger in the center of the wound. The most common stings are from wasps, hornets and bees. Individuals have different reactions to insect stings.  A normal reaction may cause pain, swelling, and redness around the sting site.  A localized allergic reaction may cause swelling and redness that extends beyond the sting site.  A large local reaction may continue to develop over the next 12 to 36 hours.  On occasion, the reactions can be severe (anaphylactic reaction). An anaphylactic reaction may cause wheezing; difficulty breathing; chest pain; fainting; raised, itchy, red patches on the skin; a sick feeling to your stomach (nausea); vomiting; cramping; or diarrhea. If you have had an anaphylactic reaction to an insect sting in the past, you are more likely to have one again. HOME CARE INSTRUCTIONS   With bee stings, a small sac of poison is left in the wound. Brushing across this with something such as a credit card, or anything similar, will help remove this and decrease the amount of the reaction. This same procedure will not help a wasp sting as they do not leave behind a stinger and poison sac.  Apply a cold compress for 10 to 20 minutes every hour for 1 to 2 days, depending on severity, to reduce swelling and itching.  To lessen pain, a paste made of water and baking soda may be rubbed on the bite or sting and left on for 5 minutes.  To relieve itching and swelling, you may use take medication or apply medicated creams or lotions as directed.  Only take over-the-counter or prescription medicines for pain, discomfort, or fever as directed by your caregiver.  Wash the sting site daily with soap and water. Apply antibiotic  ointment on the sting site as directed.  If you suffered a severe reaction:  If you did not require hospitalization, an adult will need to stay with you for 24 hours in case the symptoms return.  You may need to wear a medical bracelet or necklace stating the allergy.  You and your family need to learn when and how to use an anaphylaxis kit or epinephrine injection.  If you have had a severe reaction before, always carry your anaphylaxis kit with you. SEEK MEDICAL CARE IF:   None of the above helps within 2 to 3 days.  The area becomes red, warm, tender, and swollen beyond the area of the bite or sting.  You have an oral temperature above 102 F (38.9 C). SEEK IMMEDIATE MEDICAL CARE IF:  You have symptoms of an allergic reaction which are:  Wheezing.  Difficulty breathing.  Chest pain.  Lightheadedness or fainting.  Itchy, raised, red patches on the skin.  Nausea, vomiting, cramping or diarrhea. ANY OF THESE SYMPTOMS MAY REPRESENT A SERIOUS PROBLEM THAT IS AN EMERGENCY. Do not wait to see if the symptoms will go away. Get medical help right away. Call your local emergency services (911 in [place] DO NOT drive yourself to the hospital. MAKE SURE YOU:   Understand these instructions.  Will watch your condition.  Will get help right away if you are not doing well or get worse. Document Released: 07/03/2005 Document Revised: 09/25/2011 Document Reviewed: 12/18/2009  ExitCare® Patient Information ©2015 [employer]. This information is not intended to replace advice given to you by your health care provider. Make sure you discuss any questions you have with your health care provider. ° °

## 2014-02-22 NOTE — ED Provider Notes (Signed)
CSN: [id number]     Arrival date & time 02/22/14  1648 History   First MD Initiated Contact with Patient 02/22/14 1653     Chief Complaint  Patient presents with  . Altered Mental Status  . Cellulitis     (Consider location/radiation/quality/duration/timing/severity/associated sxs/prior Treatment) Patient is a 80 y.o. female presenting with altered mental status. The history is provided by a relative and the spouse.  Altered Mental Status  She presents for evaluation of lethargy, and a insect bite of the right hand. She apparently sustained a bite yesterday. This morning, the right hand and forearm were swollen. At that point, the patient's daughter, keep her 50 mg of Benadryl. Later, at 2 PM the hand was still swollen, so she was given another 50 mg of Benadryl. Subsequent to that the patient was drowsy and confused, more than her. There are no other reported problems. Patient cannot contribute to history.  Level V caveat- dementia .  Past Medical History  Diagnosis Date  . Cancer   . Parkinson's disease   . Osteoporosis   . Vitamin D deficiency   . Dementia   . Ventral hernia   . Pernicious anemia   . COPD (chronic obstructive pulmonary disease)    Past Surgical History  Procedure Laterality Date  . Colon surgery      bowel resection  . Breast surgery Left    Family History  Problem Relation Age of Onset  . Heart disease Mother    History  Substance Use Topics  . Smoking status: Never Smoker   . Smokeless tobacco: Never Used  . Alcohol Use: Yes     Comment: glass of wine maybe once a week   OB History   Grav Para Term Preterm Abortions TAB SAB Ect Mult Living                 Review of Systems  Unable to perform ROS     Allergies  Iodine; Ivp dye; and Shellfish allergy  Home Medications   Prior to Admission medications   Medication Sig Start Date End Date Taking? Authorizing Provider  albuterol (PROVENTIL) (2.5 MG/3ML) 0.083% nebulizer solution Take  2.5 mg by nebulization every 6 (six) hours as needed for wheezing or shortness of breath.   Yes Historical Provider, MD  budesonide (PULMICORT) 0.5 MG/2ML nebulizer solution Take 0.5 mg by nebulization daily.   Yes Historical Provider, MD  carbidopa-levodopa-entacapone (STALEVO) 25-100-200 MG per tablet Take 1 tablet by mouth 4 (four) times daily.   Yes Historical Provider, MD  cholecalciferol (VITAMIN D) 1000 UNITS tablet Take 1,000 Units by mouth daily.   Yes Historical Provider, MD  cyanocobalamin (,VITAMIN B-12,) 1000 MCG/ML injection every 30 (thirty) days.   Yes Historical Provider, MD  ENSURE PLUS (ENSURE PLUS) LIQD Take 237 mLs by mouth daily.   Yes Historical Provider, MD  rasagiline (AZILECT) 1 MG TABS tablet Take 1 mg by mouth every morning.   Yes Historical Provider, MD  Rivastigmine (EXELON) 13.3 MG/24HR PT24 Place 1 patch onto the skin daily.   Yes Historical Provider, MD  rotigotine (NEUPRO) 4 MG/24HR Place 1 patch onto the skin daily.   Yes Historical Provider, MD  denosumab (PROLIA) 60 MG/ML SOLN injection Inject 60 mg into the skin every 6 (six) months. Administer in upper arm, thigh, or abdomen    Historical Provider, MD  Nebulizers (COMPRESSOR/NEBULIZER) MISC Use with albuterol/budesonide 03/24/13   Elsie Stain, MD  Respiratory Therapy Supplies (FLUTTER) DEVI Use 4 times  daily 03/24/13   Elsie Stain, MD   BP 128/65  Pulse 75  Temp(Src) 98 F (36.7 C) (Rectal)  Resp 18  SpO2 93% Physical Exam  Nursing note and vitals reviewed. Constitutional: She appears well-developed.  Elderly, frail  HENT:  Head: Normocephalic and atraumatic.  Right Ear: External ear normal.  Left Ear: External ear normal.  No angioedema. Airway intact.  Eyes: Conjunctivae and EOM are normal. Pupils are equal, round, and reactive to light.  Neck: Normal range of motion and phonation normal. Neck supple.  Cardiovascular: Normal rate, regular rhythm, normal heart sounds and intact distal  pulses.   Pulmonary/Chest: Effort normal and breath sounds normal. No respiratory distress. She has no wheezes. She exhibits no bony tenderness.  Abdominal: Soft. There is no tenderness.  Musculoskeletal: Normal range of motion.  Right hand, and forearm with edematous, swelling, emanating from the dorsal hand, where there is some mild drainage, clear skin puncture. There is normal. Range of motion of the right elbow, wrist and hand.  Neurological: She is alert. No cranial nerve deficit or sensory deficit. She exhibits normal muscle tone. Coordination normal.  Skin: Skin is warm, dry and intact.  Psychiatric: Her behavior is normal.    ED Course  Procedures (including critical care time) Medications  predniSONE (DELTASONE) tablet 20 mg (20 mg Oral Given 02/22/14 1756)    Patient Vitals for the past 24 hrs:  BP Temp Temp src Pulse Resp SpO2  02/22/14 1741 - 98 F (36.7 C) Rectal - - -  02/22/14 1653 128/65 mmHg 98.7 F (37.1 C) Oral 75 18 93 %  02/22/14 1648 - - - - - 94 %    7:55 PM Reevaluation with update and discussion. After initial assessment and treatment, an updated evaluation reveals she is more alert at this time. There is somewhat less redness of the right hand and arm. [place] Review Labs Reviewed - No data to display  Imaging Review No results found.   EKG Interpretation None      MDM   Final diagnoses:  Insect sting allergy, current reaction, undetermined intent, initial encounter  Transient alteration of awareness    Local allergic reaction, likely related to insect bite/sting. Altered mental status, likely due to Benadryl. She is showing signs of improvement during observation in the emergency department,  Nursing Notes Reviewed/ Care Coordinated Applicable Imaging Reviewed Interpretation of Laboratory Data incorporated into ED treatment  The patient appears reasonably screened and/or stabilized for discharge and I doubt any other  medical condition or other [facility] requiring further screening, evaluation, or treatment in the ED at this time prior to discharge.  Plan: Home Medications- Prednisone, Stop Benadryl; Home Treatments- elevate right arm; return here if the recommended treatment, does not improve the symptoms; Recommended follow up- PCP prn    Richarda Blade, MD 02/22/14 2005

## 2014-02-22 NOTE — ED Notes (Addendum)
Per EMS pt coming from home with c/o insect bite to right hand and altered mental status. Pt has significant redness and  swelling to right hand and forearm, with purulent drainage, hot to touch. Pt denies pain, hx of dementia, her speech is clear,however, she is not making a lot of sense. Family reports pt had 50 mg of benadryl PO around 10:00 this morning and another 50 mg around 3:00 pm this afternoon.

## 2014-04-16 ENCOUNTER — Encounter: Payer: Self-pay | Admitting: Critical Care Medicine

## 2014-04-16 ENCOUNTER — Ambulatory Visit (INDEPENDENT_AMBULATORY_CARE_PROVIDER_SITE_OTHER): Payer: Medicare Other | Admitting: Critical Care Medicine

## 2014-04-16 VITALS — BP 109/68 | HR 80 | Temp 96.9°F | Ht <= 58 in | Wt 83.0 lb

## 2014-04-16 DIAGNOSIS — C801 Malignant (primary) neoplasm, unspecified: Secondary | ICD-10-CM | POA: Insufficient documentation

## 2014-04-16 DIAGNOSIS — C50019 Malignant neoplasm of nipple and areola, unspecified female breast: Secondary | ICD-10-CM | POA: Insufficient documentation

## 2014-04-16 DIAGNOSIS — J479 Bronchiectasis, uncomplicated: Secondary | ICD-10-CM

## 2014-04-16 HISTORY — DX: Malignant neoplasm of nipple and areola, unspecified female breast: C50.019

## 2014-04-16 HISTORY — DX: Malignant (primary) neoplasm, unspecified: C80.1

## 2014-04-16 MED ORDER — BUDESONIDE 0.5 MG/2ML IN SUSP: 0.5000 mg | Freq: Every day | RESPIRATORY_TRACT | Status: DC

## 2014-04-16 MED ORDER — ALBUTEROL SULFATE (2.5 MG/3ML) 0.083% IN NEBU: 2.5000 mg | INHALATION_SOLUTION | Freq: Four times a day (QID) | RESPIRATORY_TRACT | Status: DC | PRN

## 2014-04-16 MED ORDER — FLUTICASONE PROPIONATE 50 MCG/ACT NA SUSP: 1.0000 | Freq: Every day | NASAL | Status: DC

## 2014-04-16 NOTE — Progress Notes (Signed)
Subjective:    Patient ID: Shannon Dunn, female    DOB: [birth year], 80 y.o.   MRN: [id number]  HPI Comments: Pt sees Darron Doom and needs a pulm md.   Dx bronchiectasis lifelong.   Chronic cough>> worse x 2weeks, rx pred. Has had prior episodes of hemoptysis long ago   04/16/2014 Chief Complaint  Patient presents with  . Follow-up    Pt denies having any increase in cough or SOB at this time.   Overall the patient. She feels her budesonide has been beneficial. There is decreased cough. There is decreased wheezing.   Review of Systems  Constitutional: Positive for activity change and unexpected weight change. Negative for diaphoresis and fatigue.  HENT: Positive for voice change. Negative for congestion, dental problem, ear discharge, facial swelling, hearing loss, mouth sores, nosebleeds, sinus pressure and tinnitus.   Eyes: Negative for photophobia, discharge, itching and visual disturbance.  Respiratory: Negative for apnea, choking, chest tightness and stridor.   Cardiovascular: Positive for leg swelling. Negative for palpitations.  Gastrointestinal: Negative for nausea, vomiting, abdominal pain, constipation, blood in stool and abdominal distention.       No coughing after eating No heartburn  Genitourinary: Negative for dysuria, urgency, frequency, hematuria, flank pain, decreased urine volume and difficulty urinating.  Musculoskeletal: Positive for gait problem. Negative for arthralgias, back pain, joint swelling, neck pain and neck stiffness.  Skin: Negative for color change and pallor.  Neurological: Positive for dizziness, weakness and light-headedness. Negative for tremors, seizures, syncope, speech difficulty and numbness.  Hematological: Negative for adenopathy. Bruises/bleeds easily.  Psychiatric/Behavioral: Negative for confusion, sleep disturbance and agitation. The patient is not nervous/anxious.        Objective:   Physical Exam  Filed Vitals:   04/16/14 1137   BP: 109/68  Pulse: 80  Temp: 96.9 F (36.1 C)  TempSrc: Oral  Height: 4\' 8"  (1.422 m)  Weight: 83 lb (37.649 kg)  SpO2: 98%    Gen: Elderly white female in no distress  ENT: No lesions,  mouth clear,  oropharynx clear, no postnasal drip  Neck: No JVD, no TMG, no carotid bruits  Lungs: No use of accessory muscles, no dullness to percussion, Scattered rhonchi no wheezes  Cardiovascular: RRR, heart sounds normal, no murmur or gallops, no peripheral edema  Abdomen: soft and NT, no HSM,  BS normal  Musculoskeletal: No deformities, no cyanosis or clubbing  Neuro: alert, non focal  Skin: Warm, no lesions or rashes  No results found.     Assessment & Plan:   Bronchiectasis without acute exacerbation Bronchiectasis without acute exacerbation and chronic airflow obstruction with positive response to inhaled steroid Plan Maintain budesonide 0.5 mg by nebulizer daily Maintain albuterol by nebulizer as needed Note patient declined a flu vaccine    Updated Medication List Outpatient Encounter Prescriptions as of 04/16/2014  Medication Sig  . albuterol (PROVENTIL) (2.5 MG/3ML) 0.083% nebulizer solution Take 3 mLs (2.5 mg total) by nebulization every 6 (six) hours as needed for wheezing or shortness of breath.  . budesonide (PULMICORT) 0.5 MG/2ML nebulizer solution Take 2 mLs (0.5 mg total) by nebulization daily.  . carbidopa-levodopa-entacapone (STALEVO) 25-100-200 MG per tablet Take 1 tablet by mouth 4 (four) times daily.  . cholecalciferol (VITAMIN D) 1000 UNITS tablet Take 3,000 Units by mouth daily.   . cyanocobalamin (,VITAMIN B-12,) 1000 MCG/ML injection every 30 (thirty) days.  Marland Kitchen denosumab (PROLIA) 60 MG/ML SOLN injection Inject 60 mg into the skin every 6 (six) months. Administer in upper  arm, thigh, or abdomen  . ENSURE PLUS (ENSURE PLUS) LIQD Take 237 mLs by mouth daily.  . fluticasone (FLONASE) 50 MCG/ACT nasal spray Place 1 spray into both nostrils daily.  .  Nebulizers (COMPRESSOR/NEBULIZER) MISC Use with albuterol/budesonide  . rasagiline (AZILECT) 1 MG TABS tablet Take 1 mg by mouth every morning.  . Rivastigmine (EXELON) 13.3 MG/24HR PT24 Place 1 patch onto the skin daily.  . rotigotine (NEUPRO) 4 MG/24HR Place 1 patch onto the skin daily.  . [DISCONTINUED] albuterol (PROVENTIL) (2.5 MG/3ML) 0.083% nebulizer solution Take 2.5 mg by nebulization every 6 (six) hours as needed for wheezing or shortness of breath.  . [DISCONTINUED] budesonide (PULMICORT) 0.5 MG/2ML nebulizer solution Take 0.5 mg by nebulization daily.  . [DISCONTINUED] fluticasone (FLONASE) 50 MCG/ACT nasal spray Place 1 spray into both nostrils daily.  Marland Kitchen Respiratory Therapy Supplies (FLUTTER) DEVI Use 4 times daily  . [DISCONTINUED] predniSONE (DELTASONE) 20 MG tablet Take 1 tablet (20 mg total) by mouth daily with breakfast.

## 2014-04-16 NOTE — Assessment & Plan Note (Signed)
Bronchiectasis without acute exacerbation and chronic airflow obstruction with positive response to inhaled steroid Plan Maintain budesonide 0.5 mg by nebulizer daily Maintain albuterol by nebulizer as needed Note patient declined a flu vaccine

## 2014-04-16 NOTE — Patient Instructions (Signed)
Refills on nebulizer medication and fluticasone sent to pharmacy You declined a flu vaccine  Return 6 months

## 2014-06-09 ENCOUNTER — Telehealth: Payer: Self-pay | Admitting: Critical Care Medicine

## 2014-06-09 NOTE — Telephone Encounter (Signed)
Received fax from [facility] stating pt will only use 1/2 vial of albuterol and 1/2 of 0.5 mg vial of Pulmicort.  Requesting orders to be changed to: Albuterol 0.5% 5mg /ml take 1/2 vial via neb every morning and Pulmicort 0.5mg /2 ml Take 1/2 vial via neb daily.  Fax signed by Dr. Joya Gaskins.  I faxed it back to [phone] and placed fax in scan folder.  Med list updated.

## 2014-06-19 ENCOUNTER — Telehealth: Payer: Self-pay | Admitting: Critical Care Medicine

## 2014-06-19 NOTE — Telephone Encounter (Signed)
Called spoke with spouse who reported that pt has recently moved to [facility] about 1 week ago.  The nurse comes to give pt her nebs fairly early in the morning and pt doesn't typically feel like doing her nebs at that time so she declines and then needs the meds in the afternoon but the facility is unable to administer the meds at that time.  Spouse is requesting order for nebs to be changed to PRN so that if pt does not feel up to taking neb in the morning, she will be able to have this in the PM.    Per spouse, pt is not acute and is doing well at this time.  He will call the office if this changes.  Spouse is aware PW is not available until 12/10 and is okay with waiting.  Dr Joya Gaskins please advise, thank you.

## 2014-06-20 NOTE — Telephone Encounter (Signed)
Ok to change neb to prn dyspnea q6h

## 2014-06-22 MED ORDER — ALBUTEROL SULFATE (5 MG/ML) 0.5% IN NEBU: 2.5000 mg | INHALATION_SOLUTION | Freq: Four times a day (QID) | RESPIRATORY_TRACT | Status: DC | PRN

## 2014-06-22 MED ORDER — BUDESONIDE 0.5 MG/2ML IN SUSP: 0.5000 mg | Freq: Four times a day (QID) | RESPIRATORY_TRACT | Status: DC | PRN

## 2014-06-22 NOTE — Telephone Encounter (Signed)
Noted  

## 2014-06-22 NOTE — Telephone Encounter (Signed)
Orders changed and printed for PW to sign. Script placed in PW's look at's. Will forward to PW.

## 2014-06-25 ENCOUNTER — Encounter (HOSPITAL_COMMUNITY): Payer: Self-pay | Admitting: Nurse Practitioner

## 2014-06-25 ENCOUNTER — Emergency Department (HOSPITAL_COMMUNITY): Payer: Medicare Other

## 2014-06-25 ENCOUNTER — Emergency Department (HOSPITAL_COMMUNITY)
Admission: EM | Admit: 2014-06-25 | Discharge: 2014-06-25 | Disposition: A | Discharge status: Home / Self Care | Payer: Medicare Other | Attending: Emergency Medicine | Admitting: Emergency Medicine

## 2014-06-25 DIAGNOSIS — S0083XA Contusion of other part of head, initial encounter: Secondary | ICD-10-CM

## 2014-06-25 DIAGNOSIS — Y998 Other external cause status: Secondary | ICD-10-CM | POA: Insufficient documentation

## 2014-06-25 DIAGNOSIS — Z8719 Personal history of other diseases of the digestive system: Secondary | ICD-10-CM | POA: Diagnosis not present

## 2014-06-25 DIAGNOSIS — S0081XA Abrasion of other part of head, initial encounter: Secondary | ICD-10-CM

## 2014-06-25 DIAGNOSIS — Z859 Personal history of malignant neoplasm, unspecified: Secondary | ICD-10-CM | POA: Diagnosis not present

## 2014-06-25 DIAGNOSIS — F0390 Unspecified dementia without behavioral disturbance: Secondary | ICD-10-CM | POA: Insufficient documentation

## 2014-06-25 DIAGNOSIS — Y939 Activity, unspecified: Secondary | ICD-10-CM | POA: Diagnosis not present

## 2014-06-25 DIAGNOSIS — S022XXA Fracture of nasal bones, initial encounter for closed fracture: Secondary | ICD-10-CM | POA: Diagnosis not present

## 2014-06-25 DIAGNOSIS — T07XXXA Unspecified multiple injuries, initial encounter: Secondary | ICD-10-CM

## 2014-06-25 DIAGNOSIS — Z79899 Other long term (current) drug therapy: Secondary | ICD-10-CM | POA: Insufficient documentation

## 2014-06-25 DIAGNOSIS — W1839XA Other fall on same level, initial encounter: Secondary | ICD-10-CM | POA: Insufficient documentation

## 2014-06-25 DIAGNOSIS — W19XXXA Unspecified fall, initial encounter: Secondary | ICD-10-CM

## 2014-06-25 DIAGNOSIS — J449 Chronic obstructive pulmonary disease, unspecified: Secondary | ICD-10-CM | POA: Diagnosis not present

## 2014-06-25 DIAGNOSIS — S0990XA Unspecified injury of head, initial encounter: Secondary | ICD-10-CM | POA: Diagnosis present

## 2014-06-25 DIAGNOSIS — Y929 Unspecified place or not applicable: Secondary | ICD-10-CM | POA: Insufficient documentation

## 2014-06-25 NOTE — ED Notes (Signed)
Pt presents from [facility], report of head injury secondary to an unwitnessed fall. Pt has frontal lobe area and nasal septum injuries, c/o of pain right shoulder, denies being blood thinners, quick Head to Toe assessment reveals several hematomas, left forearm and wrist, right knee, and skin tear on left knee, pt endorses recent falls. Pt AOx2 typical of her baseline per bedside report.

## 2014-06-25 NOTE — ED Provider Notes (Addendum)
CSN: [id number]     Arrival date & time 06/25/14  0241 History   First MD Initiated Contact with Patient 06/25/14 0401     Chief Complaint  Patient presents with  . Head Injury     (Consider location/radiation/quality/duration/timing/severity/associated sxs/prior Treatment) HPI  Level 5 Caveat: dementia. This is an 80 year old female with a history of Parkinson's disease and dementia who fell at her nursing home just prior to arrival. She remembers the fall and did not have a loss of consciousness. She is not on any anticoagulation. She is complaining of pain in her forehead and nose. She denies neck pain. She has a chronic deformity of the right shoulder which she states is not acutely changed. She has a history of other recent falls with resultant ecchymoses and abrasions.   Past Medical History  Diagnosis Date  . Cancer   . Parkinson's disease   . Osteoporosis   . Vitamin D deficiency   . Dementia   . Ventral hernia   . Pernicious anemia   . COPD (chronic obstructive pulmonary disease)    Past Surgical History  Procedure Laterality Date  . Colon surgery      bowel resection  . Breast surgery Left    Family History  Problem Relation Age of Onset  . Heart disease Mother    History  Substance Use Topics  . Smoking status: Never Smoker   . Smokeless tobacco: Never Used  . Alcohol Use: Yes     Comment: glass of wine maybe once a week   OB History    No data available     Review of Systems  Unable to perform ROS   Allergies  Iodine; Ivp dye; and Shellfish allergy  Home Medications   Prior to Admission medications   Medication Sig Start Date End Date Taking? Authorizing Provider  albuterol (PROVENTIL) (5 MG/ML) 0.5% nebulizer solution Take 0.5 mLs (2.5 mg total) by nebulization every 6 (six) hours as needed for wheezing or shortness of breath. Use 1/2 vial once daily 06/22/14   Elsie Stain, MD  budesonide (PULMICORT) 0.5 MG/2ML nebulizer solution Take 2 mLs  (0.5 mg total) by nebulization every 6 (six) hours as needed. 06/22/14   Elsie Stain, MD  carbidopa-levodopa-entacapone (STALEVO) 25-100-200 MG per tablet Take 1 tablet by mouth 4 (four) times daily.    Historical Provider, MD  cholecalciferol (VITAMIN D) 1000 UNITS tablet Take 3,000 Units by mouth daily.     Historical Provider, MD  cyanocobalamin (,VITAMIN B-12,) 1000 MCG/ML injection every 30 (thirty) days.    Historical Provider, MD  denosumab (PROLIA) 60 MG/ML SOLN injection Inject 60 mg into the skin every 6 (six) months. Administer in upper arm, thigh, or abdomen    Historical Provider, MD  ENSURE PLUS (ENSURE PLUS) LIQD Take 237 mLs by mouth daily.    Historical Provider, MD  fluticasone (FLONASE) 50 MCG/ACT nasal spray Place 1 spray into both nostrils daily. 04/16/14   Elsie Stain, MD  Nebulizers (COMPRESSOR/NEBULIZER) MISC Use with albuterol/budesonide 03/24/13   Elsie Stain, MD  rasagiline (AZILECT) 1 MG TABS tablet Take 1 mg by mouth every morning.    Historical Provider, MD  Respiratory Therapy Supplies (FLUTTER) DEVI Use 4 times daily 03/24/13   Elsie Stain, MD  Rivastigmine (EXELON) 13.3 MG/24HR PT24 Place 1 patch onto the skin daily.    Historical Provider, MD  rotigotine (NEUPRO) 4 MG/24HR Place 1 patch onto the skin daily.    Historical Provider,  MD   BP 171/68 mmHg  Pulse 81  Temp(Src) 97.8 F (36.6 C) (Oral)  Resp 16  SpO2 95%   Physical Exam  General: Well-developed, well-nourished female in no acute distress; appearance consistent with age of record HENT: normocephalic; no hemotympanum; multiple abrasions and contusions to forehead and nose with associated tenderness   Eyes: pupils equal, round and reactive to light; extraocular muscles intact Neck: supple; nontender Heart: regular rate and rhythm Lungs: clear to auscultation bilaterally Abdomen: soft; nondistended; nontender; bowel sounds present Extremities: Arthritic changes; chronic appearing  deformity of right shoulder; pulses normal Neurologic: Awake, alert and oriented x 2; motor function intact in all extremities and symmetric; no facial droop Skin: Multiple ecchymoses and abrasions of varying age Psychiatric: Flat affect    ED Course  Procedures (including critical care time)   MDM  Nursing notes and vitals signs, including pulse oximetry, reviewed.  Summary of this visit's results, reviewed by myself:  Labs:  No results found for this or any previous visit (from the past 24 hour(s)).  Imaging Studies: Dg Shoulder Right  06/25/2014   CLINICAL DATA:  Unwitnessed fall at nursing home, anterior shoulder bruising and pain with movement.  EXAM: RIGHT SHOULDER - 2+ VIEW  COMPARISON:  None.  FINDINGS: No acute fracture deformity. High-riding RIGHT humeral head with periarticular sclerosis. Calcification at supraspinatus insertion. No destructive bony lesions. Bone mineral density is decreased. RIGHT shoulder soft tissue swelling without subcutaneous gas radiopaque foreign bodies.  IMPRESSION: Soft tissue swelling without acute fracture deformity dislocation. Osteopenia decreases sensitivity for acute nondisplaced fracture.  Severe RIGHT shoulder osteoarthrosis, high-riding humeral head most consistent with remote rotator cuff injury. Suspected calcific tendinopathy.   Electronically Signed   By: Elon Alas   On: 06/25/2014 03:19   Ct Head Wo Contrast  06/25/2014   CLINICAL DATA:  Unwitnessed fall this morning, forehead hematoma, nasal injury. History of dementia.  EXAM: CT HEAD WITHOUT CONTRAST  CT MAXILLOFACIAL WITHOUT CONTRAST  TECHNIQUE: Multidetector CT imaging of the head and maxillofacial structures were performed using the standard protocol without intravenous contrast. Multiplanar CT image reconstructions of the maxillofacial structures were also generated.  COMPARISON:  None.  FINDINGS: CT HEAD FINDINGS  The ventricles and sulci are normal for age. No  intraparenchymal hemorrhage, mass effect nor midline shift. Patchy supratentorial white matter hypodensities are within normal range for patient's age and though non-specific suggest sequelae of chronic small vessel ischemic disease. No acute large vascular territory infarcts.  No abnormal extra-axial fluid collections. Basal cisterns are patent. Moderate calcific atherosclerosis of the carotid siphons.  No skull fracture. Small frontal scalp hematoma without subcutaneous gas or radiopaque foreign bodies.  CT MAXILLOFACIAL FINDINGS  Mandible is intact, expansile smoothly marginated lucency within the angle of the LEFT mandible, at axial 28/79. Associated unerupted LEFT mandible molar. Condyles are located. Minimally of depressed distal LEFT nasal bone fracture.  Unerupted accessory LEFT maxillary incisor. Chronic LEFT maxillary sinusitis, paranasal sinus mucosal thickening with frothy secretions. The mastoid air cells are well aerated.  Status post bilateral ocular lens implants, orbital contents are normal. Small RIGHT frontal scalp hematoma without subcutaneous gas or radiopaque foreign bodies.  Included view of the neck demonstrates moderate calcific atherosclerosis of the carotid bulbs.  IMPRESSION: CT head: No acute intracranial process; normal noncontrast CT of the head for age.  Small RIGHT frontal scalp hematoma, no skull fracture.  CT maxillofacial: Minimally depressed distal LEFT nasal bone fracture.  Acute on chronic paranasal sinusitis.  LEFT mandible  benign-appearing probable dentigerous cyst.   Electronically Signed   By: Elon Alas   On: 06/25/2014 03:52   Ct Maxillofacial Wo Cm  06/25/2014   CLINICAL DATA:  Unwitnessed fall this morning, forehead hematoma, nasal injury. History of dementia.  EXAM: CT HEAD WITHOUT CONTRAST  CT MAXILLOFACIAL WITHOUT CONTRAST  TECHNIQUE: Multidetector CT imaging of the head and maxillofacial structures were performed using the standard protocol without  intravenous contrast. Multiplanar CT image reconstructions of the maxillofacial structures were also generated.  COMPARISON:  None.  FINDINGS: CT HEAD FINDINGS  The ventricles and sulci are normal for age. No intraparenchymal hemorrhage, mass effect nor midline shift. Patchy supratentorial white matter hypodensities are within normal range for patient's age and though non-specific suggest sequelae of chronic small vessel ischemic disease. No acute large vascular territory infarcts.  No abnormal extra-axial fluid collections. Basal cisterns are patent. Moderate calcific atherosclerosis of the carotid siphons.  No skull fracture. Small frontal scalp hematoma without subcutaneous gas or radiopaque foreign bodies.  CT MAXILLOFACIAL FINDINGS  Mandible is intact, expansile smoothly marginated lucency within the angle of the LEFT mandible, at axial 28/79. Associated unerupted LEFT mandible molar. Condyles are located. Minimally of depressed distal LEFT nasal bone fracture.  Unerupted accessory LEFT maxillary incisor. Chronic LEFT maxillary sinusitis, paranasal sinus mucosal thickening with frothy secretions. The mastoid air cells are well aerated.  Status post bilateral ocular lens implants, orbital contents are normal. Small RIGHT frontal scalp hematoma without subcutaneous gas or radiopaque foreign bodies.  Included view of the neck demonstrates moderate calcific atherosclerosis of the carotid bulbs.  IMPRESSION: CT head: No acute intracranial process; normal noncontrast CT of the head for age.  Small RIGHT frontal scalp hematoma, no skull fracture.  CT maxillofacial: Minimally depressed distal LEFT nasal bone fracture.  Acute on chronic paranasal sinusitis.  LEFT mandible benign-appearing probable dentigerous cyst.   Electronically Signed   By: Elon Alas   On: 06/25/2014 03:52       Wynetta Fines, MD 06/25/14 0422  Wynetta Fines, MD 06/25/14 [id number]

## 2014-06-25 NOTE — ED Notes (Signed)
Bed: WA17 Expected date:  Expected time:  Means of arrival:  Comments: EMS 80yo Fall from SNF, nosebleed, rt shoulder arm pain, laceration

## 2014-06-26 NOTE — Telephone Encounter (Signed)
Rxs signed by Dr. Joya Gaskins and faxed to [facility] at [phone].  Husband aware and voiced no further questions or concerns at this time.

## 2014-07-21 ENCOUNTER — Ambulatory Visit: Payer: Medicare Other | Admitting: Critical Care Medicine

## 2014-10-21 ENCOUNTER — Telehealth: Payer: Self-pay | Admitting: Critical Care Medicine

## 2014-10-21 MED ORDER — LEVOFLOXACIN 500 MG PO TABS: 500.0000 mg | ORAL_TABLET | Freq: Every day | ORAL | Status: DC

## 2014-10-21 MED ORDER — PREDNISONE 10 MG PO TABS: ORAL_TABLET | ORAL | Status: DC

## 2014-10-21 NOTE — Telephone Encounter (Signed)
Spoke with the pt's spouse and notified of recs per PW  He verbalized understanding  Nothing further needed

## 2014-10-21 NOTE — Telephone Encounter (Signed)
Call in levaquin 500mg  daily x 5 days Prednisone 10mg  Take 4 for two days three for two days two for two days one for two days #20 Ov if unimproved

## 2014-10-21 NOTE — Telephone Encounter (Signed)
Spoke with Shannon Dunn's husband, states she's had a rattling in her chest, has had a prod cough with dark green mucus since yesterday, runny nose.   Denies chest pain, fever.   Shannon Dunn has not taken anything to help these symptoms.   Shannon Dunn uses [facility] on [address].  Dr. Joya Gaskins please advise on recs.  Thanks!

## 2014-12-24 DIAGNOSIS — Z85828 Personal history of other malignant neoplasm of skin: Secondary | ICD-10-CM | POA: Insufficient documentation

## 2015-01-06 DIAGNOSIS — F05 Delirium due to known physiological condition: Secondary | ICD-10-CM | POA: Insufficient documentation

## 2015-01-26 ENCOUNTER — Ambulatory Visit (INDEPENDENT_AMBULATORY_CARE_PROVIDER_SITE_OTHER): Payer: Medicare Other | Admitting: Critical Care Medicine

## 2015-01-26 ENCOUNTER — Encounter: Payer: Self-pay | Admitting: Critical Care Medicine

## 2015-01-26 ENCOUNTER — Ambulatory Visit (INDEPENDENT_AMBULATORY_CARE_PROVIDER_SITE_OTHER)
Admission: RE | Admit: 2015-01-26 | Discharge: 2015-01-26 | Disposition: A | Discharge status: Home / Self Care | Payer: Medicare Other | Source: Ambulatory Visit | Attending: Critical Care Medicine | Admitting: Critical Care Medicine

## 2015-01-26 VITALS — BP 88/60 | HR 91 | Temp 98.0°F | Ht <= 58 in | Wt 88.0 lb

## 2015-01-26 DIAGNOSIS — C801 Malignant (primary) neoplasm, unspecified: Secondary | ICD-10-CM | POA: Diagnosis not present

## 2015-01-26 DIAGNOSIS — J471 Bronchiectasis with (acute) exacerbation: Secondary | ICD-10-CM | POA: Diagnosis not present

## 2015-01-26 DIAGNOSIS — J189 Pneumonia, unspecified organism: Secondary | ICD-10-CM

## 2015-01-26 NOTE — Progress Notes (Signed)
   Subjective:    Patient ID: Shannon Dunn, female    DOB: 1924/04/14, 81 y.o.   MRN: [id number]  HPI 01/26/2015 Chief Complaint  Patient presents with  . Follow-up    getting over pneumonia.  coughing, yellow sputum.      Pt went to urgent care 6/29 Dx PNA RLL issues. Rx levaquin 750mg /d x 7days.  Pt returned 7/9 and CXR was better.  Rx 5 more levo 750mg  given.   Symptoms coughing, yellow mucus.  No f/c/s. No real chest pain.  Pt would feel something if cough.  Pt noted some dyspnea with exertion.  Pt is at [facility].    Current Medications, Allergies, Complete Past Medical History, Past Surgical History, Family History, and Social History were reviewed in [place] record per todays encounter:  01/26/2015  Review of Systems  Constitutional: Positive for fatigue.  HENT: Negative.  Negative for ear pain, postnasal drip, rhinorrhea, sinus pressure, sore throat, trouble swallowing and voice change.   Eyes: Negative.   Respiratory: Positive for cough and shortness of breath. Negative for apnea, choking, chest tightness, wheezing and stridor.   Cardiovascular: Negative.  Negative for chest pain, palpitations and leg swelling.  Gastrointestinal: Negative.  Negative for nausea, vomiting, abdominal pain and abdominal distention.  Genitourinary: Negative.   Musculoskeletal: Negative.  Negative for myalgias and arthralgias.  Skin: Negative.  Negative for rash.  Allergic/Immunologic: Negative.  Negative for environmental allergies and food allergies.  Neurological: Negative.  Negative for dizziness, syncope, weakness and headaches.  Hematological: Negative.  Negative for adenopathy. Does not bruise/bleed easily.  Psychiatric/Behavioral: Negative.  Negative for sleep disturbance and agitation. The patient is not nervous/anxious.        Objective:   Physical Exam  Filed Vitals:   01/26/15 1059  BP: 88/60  Pulse: 91  Temp: 98 F (36.7 C)  TempSrc: Oral    Height: 4\' 7"  (1.397 m)  Weight: 88 lb (39.917 kg)  SpO2: 95%    Gen: Pleasant, thin , in no distress,  normal affect  ENT: No lesions,  mouth clear,  oropharynx clear, no postnasal drip  Neck: No JVD, no TMG, no carotid bruits  Lungs: No use of accessory muscles, no dullness to percussion, improved BS in RLL  Cardiovascular: RRR, heart sounds normal, no murmur or gallops, no peripheral edema  Abdomen: soft and NT, no HSM,  BS normal  Musculoskeletal: No deformities, no cyanosis or clubbing  Neuro: alert, non focal  Skin: echhymoses on skin  No results found. CXR reviewed       Assessment & Plan:  I personally reviewed all images and lab data in the [facility] system as well as any outside material available during this office visit and agree with the  radiology impressions.   Bronchiectasis without acute exacerbation Bronchiectasis with recent RLL PNA, i suspect d/t aspiration with parkinsonism and debilitation Plan Cont abx  Cont inhaled neb meds   RLL CAP Cont abx Kristian was seen today for follow-up.  Diagnoses and all orders for this visit:  CAP (community acquired pneumonia) Orders: -     DG Chest 2 View; Future  Bronchiectasis with acute exacerbation  Cancer    I had an extended discussion with the patient and or family lasting 10 minutes of a 25 minute visit including:  Diagnosis, med rec was complex, need for ABX for PNA

## 2015-01-26 NOTE — Patient Instructions (Signed)
Finish Levaquin CXR today Budesonide and Albuterol Nebulizers twice daily  Follow up with Dr. Joya Gaskins in 2 months.

## 2015-01-27 NOTE — Assessment & Plan Note (Signed)
Bronchiectasis with recent RLL PNA, i suspect d/t aspiration with parkinsonism and debilitation Plan Cont abx  Cont inhaled neb meds

## 2015-01-27 NOTE — Progress Notes (Signed)
Quick Note:  LVM for pt to return call ______ 

## 2015-01-28 ENCOUNTER — Telehealth: Payer: Self-pay | Admitting: Critical Care Medicine

## 2015-01-28 MED ORDER — BUDESONIDE 0.5 MG/2ML IN SUSP: 0.5000 mg | Freq: Two times a day (BID) | RESPIRATORY_TRACT | Status: DC

## 2015-01-28 MED ORDER — ALBUTEROL SULFATE (5 MG/ML) 0.5% IN NEBU: 2.5000 mg | INHALATION_SOLUTION | Freq: Two times a day (BID) | RESPIRATORY_TRACT | Status: DC

## 2015-01-28 NOTE — Telephone Encounter (Signed)
Return call.Shannon Dunn °

## 2015-01-28 NOTE — Telephone Encounter (Signed)
Called [place] and LMTCB x1

## 2015-01-28 NOTE — Telephone Encounter (Signed)
Result Notes     Notes Recorded by Osa Craver, CMA on 01/27/2015 at 2:41 PM LVM for pt to return call ------  Notes Recorded by Elsie Stain, MD on 01/26/2015 at 4:30 PM Call pt husband and let him know pneumonia has resolved     Attempted to call pt. No answer. Will try back.

## 2015-01-28 NOTE — Telephone Encounter (Signed)
Spoke with pt's husband. He is aware of her results. States that when they were here the other day, PW changed some rx's. These need to be updated and faxed to [facility] at [phone]. The rx's were albuterol and budesonide nebs. These will be faxed over.

## 2015-01-28 NOTE — Telephone Encounter (Signed)
Pt's husband returned call - [phone]

## 2015-02-01 ENCOUNTER — Telehealth: Payer: Self-pay | Admitting: Critical Care Medicine

## 2015-02-01 NOTE — Telephone Encounter (Signed)
lmtcb x1 for [place].

## 2015-02-02 NOTE — Telephone Encounter (Signed)
Spoke with pt's husband.  Shannon Dunn is having questions from [facility] prescribing pt's neb meds.  Jacquelynn Cree at [phone] and spoke with Verdis Frederickson.  She states they received copy of AVS on 01/26/15 and Med list listed Albuterol and Budesonide every 6 hrs as needed but pt instructions state ned meds to be used twice daily.  Dr Joya Gaskins, please clarify how often for neb meds.

## 2015-02-02 NOTE — Telephone Encounter (Signed)
Order should read albuterol and budesonide bid

## 2015-02-03 NOTE — Telephone Encounter (Signed)
Spoke with Jinny Blossom at [facility], verified that pt needs to be taking her albuterol and budesonide bid.  Per Jinny Blossom, a written order to d/c the orders for neb meds q6h prn needs to be faxed to [phone].   This has been sent.  Nothing further needed.

## 2015-02-12 ENCOUNTER — Telehealth: Payer: Self-pay | Admitting: Critical Care Medicine

## 2015-02-12 MED ORDER — AZITHROMYCIN 250 MG PO TABS: 250.0000 mg | ORAL_TABLET | ORAL | Status: DC

## 2015-02-12 NOTE — Telephone Encounter (Signed)
Zpack was sent  Spoke with the pt's spouse and notified that this was done  Nothing further needed

## 2015-02-12 NOTE — Telephone Encounter (Signed)
Call in azithromycin 250mg Take two once then one daily until gone #6 

## 2015-02-12 NOTE — Telephone Encounter (Signed)
Spoke with the pt's spouse  He states that she has had prod cough with minimal green sputum x 2 days  He states that she is no more SOB than usual and she has not had wheezing, chest tightness or fever  She has been weak and having diarrhea this wk- had taken immodium and this has helped  I advised to make sure drinking plenty of fluids  PW please advise recs thanks! Allergies  Allergen Reactions  . Iodine Other (See Comments)    Unknown, might be rash/hives  . Ivp Dye [Iodinated Diagnostic Agents] Other (See Comments)    Unknown rash/hives maybe  . Shellfish Allergy Other (See Comments)    Unknown rash/hives, scallops

## 2015-02-22 ENCOUNTER — Encounter: Payer: Self-pay | Admitting: Pulmonary Disease

## 2015-02-22 ENCOUNTER — Ambulatory Visit (INDEPENDENT_AMBULATORY_CARE_PROVIDER_SITE_OTHER)
Admission: RE | Admit: 2015-02-22 | Discharge: 2015-02-22 | Disposition: A | Discharge status: Home / Self Care | Payer: Medicare Other | Source: Ambulatory Visit | Attending: Pulmonary Disease | Admitting: Pulmonary Disease

## 2015-02-22 ENCOUNTER — Telehealth: Payer: Self-pay | Admitting: Pulmonary Disease

## 2015-02-22 ENCOUNTER — Other Ambulatory Visit (INDEPENDENT_AMBULATORY_CARE_PROVIDER_SITE_OTHER): Payer: Medicare Other

## 2015-02-22 ENCOUNTER — Ambulatory Visit (INDEPENDENT_AMBULATORY_CARE_PROVIDER_SITE_OTHER): Payer: Medicare Other | Admitting: Pulmonary Disease

## 2015-02-22 ENCOUNTER — Telehealth: Payer: Self-pay | Admitting: Critical Care Medicine

## 2015-02-22 VITALS — BP 150/78 | HR 90 | Temp 98.1°F | Resp 16 | Ht <= 58 in | Wt 88.8 lb

## 2015-02-22 DIAGNOSIS — J471 Bronchiectasis with (acute) exacerbation: Secondary | ICD-10-CM | POA: Diagnosis not present

## 2015-02-22 DIAGNOSIS — R197 Diarrhea, unspecified: Secondary | ICD-10-CM

## 2015-02-22 DIAGNOSIS — J411 Mucopurulent chronic bronchitis: Secondary | ICD-10-CM | POA: Diagnosis not present

## 2015-02-22 DIAGNOSIS — J189 Pneumonia, unspecified organism: Secondary | ICD-10-CM

## 2015-02-22 LAB — CBC WITH DIFFERENTIAL/PLATELET
BASOS ABS: 0 10*3/uL (ref 0.0–0.1)
Basophils Relative: 0.5 % (ref 0.0–3.0)
Eosinophils Absolute: 0.1 10*3/uL (ref 0.0–0.7)
Eosinophils Relative: 1.7 % (ref 0.0–5.0)
HEMATOCRIT: 29.4 % — AB (ref 36.0–46.0)
Hemoglobin: 9.4 g/dL — ABNORMAL LOW (ref 12.0–15.0)
LYMPHS ABS: 1.1 10*3/uL (ref 0.7–4.0)
LYMPHS PCT: 15 % (ref 12.0–46.0)
MCHC: 32.1 g/dL (ref 30.0–36.0)
MCV: 76.5 fl — AB (ref 78.0–100.0)
Monocytes Absolute: 0.5 10*3/uL (ref 0.1–1.0)
Monocytes Relative: 7.5 % (ref 3.0–12.0)
Neutro Abs: 5.3 10*3/uL (ref 1.4–7.7)
Neutrophils Relative %: 75.3 % (ref 43.0–77.0)
Platelets: 341 10*3/uL (ref 150.0–400.0)
RBC: 3.84 Mil/uL — ABNORMAL LOW (ref 3.87–5.11)
RDW: 21.1 % — AB (ref 11.5–15.5)
WBC: 7 10*3/uL (ref 4.0–10.5)

## 2015-02-22 LAB — BASIC METABOLIC PANEL
BUN: 17 mg/dL (ref 6–23)
CHLORIDE: 103 meq/L (ref 96–112)
CO2: 28 mEq/L (ref 19–32)
Calcium: 8.3 mg/dL — ABNORMAL LOW (ref 8.4–10.5)
Creatinine, Ser: 0.71 mg/dL (ref 0.40–1.20)
GFR: 82.38 mL/min (ref >60.00)
GLUCOSE: 98 mg/dL (ref 70–99)
Potassium: 3.8 mEq/L (ref 3.5–5.1)
SODIUM: 136 meq/L (ref 135–145)

## 2015-02-22 MED ORDER — CEFDINIR 300 MG PO CAPS: 300.0000 mg | ORAL_CAPSULE | Freq: Two times a day (BID) | ORAL | Status: DC

## 2015-02-22 NOTE — Telephone Encounter (Signed)
lmtcb for pt.  

## 2015-02-22 NOTE — Telephone Encounter (Signed)
Pt was instructed to follow up in 4 weeks. RA does not have any openings.  RA - can the pt follow up with TP?

## 2015-02-22 NOTE — Telephone Encounter (Signed)
Okay 

## 2015-02-22 NOTE — Patient Instructions (Addendum)
CXR Blood work today Check stool for c diff Based on above we will call in antibiotic

## 2015-02-22 NOTE — Progress Notes (Signed)
   Subjective:    Patient ID: Shannon Dunn, female    DOB: 1923-10-02, 81 y.o.   MRN: [id number]  HPI  81 y.o never smoker with Bronchiectasis &  recent RLL PNA-  with parkinsonism and debilitation  Chief Complaint  Patient presents with  . Acute Visit    Zpak caused severe diarrhea has used Immodium; wheezing; coughing up green mucus.     She is in [facility] living [facility] the [facility] care area - husband lives closeby. Accompanied by daughter - pharmacist Pt went to urgent care 6/29 Dx PNA RLL issues. Rx levaquin 750mg /d x 7days. Pt returned 7/9 and CXR was better. Rx 5 more levo 750mg  given. Called back 7/29 for green sputum >> Zpak She has chronic diarrhea since colectomy in 2004, but has been worse over last 3-4 dayss inspite of round the clock imodium - Daughter requests more antibiotic   CXR - no infiltrate WBC nml, Hb 9.4  Review of Systems neg for any significant sore throat, dysphagia, itching, sneezing, nasal congestion or excess/ purulent secretions, fever, chills, sweats, unintended wt loss, pleuritic or exertional cp, hempoptysis, orthopnea pnd or change in chronic leg swelling. Also denies presyncope, palpitations, heartburn, abdominal pain, nausea, vomiting, diarrhea or change in bowel or urinary habits, dysuria,hematuria, rash, arthralgias, visual complaints, headache, numbness weakness or ataxia.     Objective:   Physical Exam  Gen. Pleasant, well-nourished, in no distress ENT - no lesions, no post nasal drip Neck: No JVD, no thyromegaly, no carotid bruits Lungs: no use of accessory muscles, no dullness to percussion, left basal rales  Cardiovascular: Rhythm regular, heart sounds  normal, no murmurs or gallops, no peripheral edema Musculoskeletal: No deformities, no cyanosis or clubbing        Assessment & Plan:

## 2015-02-22 NOTE — Assessment & Plan Note (Addendum)
CXR CBC, BMET today Check stool for c diff Based on above we will call in antibiotic - omnicef vs low dose levaquin  Addendum -CXR - no infiltrate WBC nml, Hb 9.4 Omnicef given

## 2015-02-22 NOTE — Telephone Encounter (Signed)
Called and spoke to pt's husband. Pt is still c/o prod cough with green mucus, SOB, and diarrhea. Pt was given zpak with little relief. Appt made with Dr. Elsworth Soho today 8.8.16 at 2:45pm. Pt verbalized understanding and denied any further questions or concerns at this time.

## 2015-02-23 ENCOUNTER — Other Ambulatory Visit: Payer: Medicare Other

## 2015-02-23 DIAGNOSIS — J189 Pneumonia, unspecified organism: Secondary | ICD-10-CM

## 2015-02-23 DIAGNOSIS — R197 Diarrhea, unspecified: Secondary | ICD-10-CM

## 2015-02-23 NOTE — Telephone Encounter (Signed)
Patients husband came by the office. Patient is staying at [facility] home care and needs the medication information faxed to them so they can administer patient's Budesonide and Cefdinir. Letter faxed to [facility] with above orders for medication dispense. Patient husband notified. Nothing further needed.

## 2015-02-23 NOTE — Telephone Encounter (Signed)
PT HUSBAND IN LOBBY WANTING TO SPEAK TO NURSE

## 2015-02-24 ENCOUNTER — Telehealth: Payer: Self-pay | Admitting: Pulmonary Disease

## 2015-02-24 LAB — C. DIFFICILE GDH AND TOXIN A/B
C. DIFF TOXIN A/B: NOT DETECTED
C. difficile GDH: NOT DETECTED

## 2015-02-24 NOTE — Assessment & Plan Note (Signed)
Use budesonide & duonebs during acute illness

## 2015-02-24 NOTE — Telephone Encounter (Signed)
Eddie Dibbles notified that stool cx did not show C diff He says that patient is feeling better now. Nothing further needed.

## 2015-02-27 LAB — STOOL CULTURE

## 2015-03-25 ENCOUNTER — Telehealth: Payer: Self-pay | Admitting: Pulmonary Disease

## 2015-03-25 MED ORDER — BUDESONIDE 0.5 MG/2ML IN SUSP: 0.5000 mg | Freq: Two times a day (BID) | RESPIRATORY_TRACT | Status: DC

## 2015-03-25 NOTE — Telephone Encounter (Signed)
Patient's husband called back to also ask this be a 50 day supply.

## 2015-03-25 NOTE — Telephone Encounter (Signed)
Patient requesting 90 day supply of Budesonide Rx sent to pharmacy Nothing further needed.

## 2015-03-26 ENCOUNTER — Other Ambulatory Visit: Payer: Self-pay | Admitting: Pulmonary Disease

## 2015-03-26 MED ORDER — BUDESONIDE 0.5 MG/2ML IN SUSP: 0.5000 mg | Freq: Two times a day (BID) | RESPIRATORY_TRACT | Status: DC

## 2015-04-09 ENCOUNTER — Emergency Department (HOSPITAL_COMMUNITY): Payer: Medicare Other

## 2015-04-09 ENCOUNTER — Emergency Department (HOSPITAL_COMMUNITY)
Admission: EM | Admit: 2015-04-09 | Discharge: 2015-04-09 | Disposition: A | Discharge status: Home / Self Care | Payer: Medicare Other | Attending: Emergency Medicine | Admitting: Emergency Medicine

## 2015-04-09 ENCOUNTER — Encounter (HOSPITAL_COMMUNITY): Payer: Self-pay | Admitting: *Deleted

## 2015-04-09 DIAGNOSIS — Z7951 Long term (current) use of inhaled steroids: Secondary | ICD-10-CM | POA: Insufficient documentation

## 2015-04-09 DIAGNOSIS — R4182 Altered mental status, unspecified: Secondary | ICD-10-CM | POA: Diagnosis present

## 2015-04-09 DIAGNOSIS — Z859 Personal history of malignant neoplasm, unspecified: Secondary | ICD-10-CM | POA: Insufficient documentation

## 2015-04-09 DIAGNOSIS — F0390 Unspecified dementia without behavioral disturbance: Secondary | ICD-10-CM | POA: Diagnosis not present

## 2015-04-09 DIAGNOSIS — G20 Parkinson's disease: Secondary | ICD-10-CM | POA: Diagnosis not present

## 2015-04-09 DIAGNOSIS — J449 Chronic obstructive pulmonary disease, unspecified: Secondary | ICD-10-CM | POA: Diagnosis not present

## 2015-04-09 DIAGNOSIS — Z79899 Other long term (current) drug therapy: Secondary | ICD-10-CM | POA: Diagnosis not present

## 2015-04-09 DIAGNOSIS — G20A1 Parkinson's disease without dyskinesia, without mention of fluctuations: Secondary | ICD-10-CM

## 2015-04-09 DIAGNOSIS — J189 Pneumonia, unspecified organism: Secondary | ICD-10-CM

## 2015-04-09 DIAGNOSIS — J159 Unspecified bacterial pneumonia: Secondary | ICD-10-CM | POA: Diagnosis not present

## 2015-04-09 LAB — CBC WITH DIFFERENTIAL/PLATELET
BASOS ABS: 0.1 10*3/uL (ref 0.0–0.1)
BASOS PCT: 1 %
EOS ABS: 0.1 10*3/uL (ref 0.0–0.7)
Eosinophils Relative: 1 %
HCT: 36.7 % (ref 36.0–46.0)
Hemoglobin: 11.7 g/dL — ABNORMAL LOW (ref 12.0–15.0)
LYMPHS ABS: 1.6 10*3/uL (ref 0.7–4.0)
Lymphocytes Relative: 30 %
MCH: 28.3 pg (ref 26.0–34.0)
MCHC: 31.9 g/dL (ref 30.0–36.0)
MCV: 88.6 fL (ref 78.0–100.0)
MONO ABS: 0.4 10*3/uL (ref 0.1–1.0)
Monocytes Relative: 8 %
NEUTROS ABS: 3.1 10*3/uL (ref 1.7–7.7)
Neutrophils Relative %: 60 %
PLATELETS: 276 10*3/uL (ref 150–400)
RBC: 4.14 MIL/uL (ref 3.87–5.11)
RDW: 23.9 % — AB (ref 11.5–15.5)
WBC: 5.3 10*3/uL (ref 4.0–10.5)

## 2015-04-09 LAB — COMPREHENSIVE METABOLIC PANEL
ALBUMIN: 3.3 g/dL — AB (ref 3.5–5.0)
ALK PHOS: 50 U/L (ref 38–126)
ALT: 5 U/L — ABNORMAL LOW (ref 14–54)
ANION GAP: 4 — AB (ref 5–15)
AST: 16 U/L (ref 15–41)
BILIRUBIN TOTAL: 0.3 mg/dL (ref 0.3–1.2)
BUN: 17 mg/dL (ref 6–20)
CALCIUM: 9 mg/dL (ref 8.9–10.3)
CO2: 31 mmol/L (ref 22–32)
Chloride: 103 mmol/L (ref 101–111)
Creatinine, Ser: 0.93 mg/dL (ref 0.44–1.00)
GFR, EST NON AFRICAN AMERICAN: 53 mL/min — AB (ref >60)
GLUCOSE: 94 mg/dL (ref 65–99)
Potassium: 4 mmol/L (ref 3.5–5.1)
Sodium: 138 mmol/L (ref 135–145)
TOTAL PROTEIN: 7 g/dL (ref 6.5–8.1)

## 2015-04-09 LAB — URINALYSIS, ROUTINE W REFLEX MICROSCOPIC
BILIRUBIN URINE: NEGATIVE
GLUCOSE, UA: NEGATIVE mg/dL
Hgb urine dipstick: NEGATIVE
KETONES UR: NEGATIVE mg/dL
Leukocytes, UA: NEGATIVE
NITRITE: NEGATIVE
PH: 6.5 (ref 5.0–8.0)
Protein, ur: NEGATIVE mg/dL
Specific Gravity, Urine: 1.019 (ref 1.005–1.030)
Urobilinogen, UA: 0.2 mg/dL (ref 0.0–1.0)

## 2015-04-09 LAB — BRAIN NATRIURETIC PEPTIDE: B Natriuretic Peptide: 98 pg/mL (ref 0.0–100.0)

## 2015-04-09 LAB — I-STAT TROPONIN, ED: TROPONIN I, POC: 0.01 ng/mL (ref 0.00–0.08)

## 2015-04-09 MED ORDER — ACIDOPHILUS PROBIOTIC 10 MG PO TABS: ORAL_TABLET | ORAL | Status: DC

## 2015-04-09 MED ORDER — LEVOFLOXACIN 750 MG PO TABS: 750.0000 mg | ORAL_TABLET | Freq: Every day | ORAL | Status: DC

## 2015-04-09 NOTE — Consult Note (Signed)
Reason for Consult:Period of unresponsiveness Referring Physician: Colin Rhein  CC: Period of unresponsiveness  HPI: Shannon Dunn is an 81 y.o. female with a history of PD, followed at [facility].  Patient has been doing well on her current regimen.  Today was noted by facility staff to be seated and unable to move.  Patient's mouth was sealed tight and she was unable to talk.  Episode lasted about 1-2 hours and resolved.  Patient is now back to baseline  There is some question as to whether patient is confused after the episode.  patient has a significant history of dementia.  Falls frequently from her PD. Patient's last episode of freezing was about 9-12 months ago.  She has had a few of these in the past.  They are all described the same but this one as longer.    Past Medical History  Diagnosis Date  . Cancer   . Parkinson's disease   . Osteoporosis   . Vitamin D deficiency   . Dementia   . Ventral hernia   . Pernicious anemia   . COPD (chronic obstructive pulmonary disease)     Past Surgical History  Procedure Laterality Date  . Colon surgery      bowel resection  . Breast surgery Left     Family History  Problem Relation Age of Onset  . Heart disease Mother     Social History:  reports that she has never smoked. She has never used smokeless tobacco. She reports that she drinks alcohol. She reports that she does not use illicit drugs.  Allergies  Allergen Reactions  . Zithromax [Azithromycin] Diarrhea  . Iodine Other (See Comments)    Unknown, might be rash/hives  . Ivp Dye [Iodinated Diagnostic Agents] Other (See Comments)    Unknown rash/hives maybe  . Shellfish Allergy Other (See Comments)    Unknown rash/hives, scallops     Medications: I have reviewed the patient's current medications. Prior to Admission medications   Medication Sig Start Date End Date Taking? Authorizing Provider  budesonide (PULMICORT) 0.5 MG/2ML nebulizer solution Take 2 mLs (0.5 mg total)  by nebulization 2 (two) times daily. 03/26/15  Yes Rigoberto Noel, MD  Calcium Carb-Cholecalciferol (CALCIUM 600+D3) 600-800 MG-UNIT TABS Take 1 tablet by mouth 2 (two) times daily.   Yes Historical Provider, MD  Carbidopa-Levodopa ER (RYTARY) 48.75-195 MG CPCR Take 1 tablet by mouth 3 (three) times daily.   Yes Historical Provider, MD  cholecalciferol (VITAMIN D) 1000 UNITS tablet Take 3,000 Units by mouth daily.    Yes Historical Provider, MD  cyanocobalamin (,VITAMIN B-12,) 1000 MCG/ML injection every 30 (thirty) days.   Yes Historical Provider, MD  ENSURE PLUS (ENSURE PLUS) LIQD Take 237 mLs by mouth daily.   Yes Historical Provider, MD  ferrous sulfate 300 (60 FE) MG/5ML syrup Take 600 mg by mouth daily.   Yes Historical Provider, MD  fluticasone (FLONASE) 50 MCG/ACT nasal spray Place 1 spray into both nostrils daily.   Yes Historical Provider, MD  loperamide (IMODIUM) 2 MG capsule Take 2 mg by mouth daily after breakfast.   Yes Historical Provider, MD  rasagiline (AZILECT) 1 MG TABS tablet Take 1 mg by mouth every morning.   Yes Historical Provider, MD  Rivastigmine (EXELON) 13.3 MG/24HR PT24 Place 1 patch onto the skin daily.   Yes Historical Provider, MD  rotigotine (NEUPRO) 4 MG/24HR Place 1 patch onto the skin daily.   Yes Historical Provider, MD  vitamin B-12 (CYANOCOBALAMIN) 1000 MCG tablet  Take 1,000 mcg by mouth daily.   Yes Historical Provider, MD  Wheat Dextrin (BENEFIBER PO) Take 1 scoop by mouth daily.   Yes Historical Provider, MD  albuterol (PROVENTIL) (5 MG/ML) 0.5% nebulizer solution Take 0.5 mLs (2.5 mg total) by nebulization 2 (two) times daily. 01/28/15   Elsie Stain, MD  Calcium-Vitamin D-Vitamin K (VIACTIV) 675-916-38 MG-UNT-MCG CHEW Chew 1 tablet by mouth 2 (two) times daily.    Historical Provider, MD  cefdinir (OMNICEF) 300 MG capsule Take 1 capsule (300 mg total) by mouth 2 (two) times daily. 02/22/15   Rigoberto Noel, MD  denosumab (PROLIA) 60 MG/ML SOLN injection  Inject 60 mg into the skin every 6 (six) months. Administer in upper arm, thigh, or abdomen    Historical Provider, MD  Lactobacillus (ACIDOPHILUS PROBIOTIC) 10 MG TABS 1 tab PO daily 04/09/15   Debby Freiberg, MD  levofloxacin (LEVAQUIN) 750 MG tablet Take 1 tablet (750 mg total) by mouth daily. X 7 days 04/09/15   Debby Freiberg, MD    ROS: History obtained from the patient and family  General ROS: negative for - chills, fatigue, fever, night sweats, weight gain or weight loss Psychological ROS: nightmares Ophthalmic ROS: negative for - blurry vision, double vision, eye pain or loss of vision ENT ROS: negative for - epistaxis, nasal discharge, oral lesions, sore throat, tinnitus or vertigo Allergy and Immunology ROS: negative for - hives or itchy/watery eyes Hematological and Lymphatic ROS: negative for - bleeding problems, bruising or swollen lymph nodes Endocrine ROS: negative for - galactorrhea, hair pattern changes, polydipsia/polyuria or temperature intolerance Respiratory ROS: negative for - cough, hemoptysis, shortness of breath or wheezing Cardiovascular ROS: negative for - chest pain, dyspnea on exertion, edema or irregular heartbeat Gastrointestinal ROS: negative for - abdominal pain, diarrhea, hematemesis, nausea/vomiting or stool incontinence Genito-Urinary ROS: negative for - dysuria, hematuria, incontinence or urinary frequency/urgency Musculoskeletal ROS: stooped gait Neurological ROS: as noted in HPI Dermatological ROS: bruising  Physical Examination: Blood pressure 130/73, pulse 77, temperature 97.7 F (36.5 C), temperature source Oral, resp. rate 16, height 4\' 11"  (1.499 m), weight 39.917 kg (88 lb), SpO2 98 %.  HEENT-  Normocephalic, no lesions, without obvious abnormality.  Normal external eye and conjunctiva.  Normal TM's bilaterally.  Normal auditory canals and external ears. Normal external nose, mucus membranes and septum.  Normal pharynx. Cardiovascular- S1, S2  normal, pulses palpable throughout   Lungs- chest clear, no wheezing, rales, normal symmetric air entry Abdomen- soft, non-tender; bowel sounds normal; no masses,  no organomegaly Extremities- mild left lower extremity swelling Lymph-no adenopathy palpable Musculoskeletal-no joint tenderness, deformity or swelling Skin-frequent bruising  Neurological Examination Mental Status: Alert, memory poor.  Speech fluent without evidence of aphasia.  Able to follow 3 step commands without difficulty. Cranial Nerves: II: Discs flat bilaterally; Visual fields grossly normal, pupils pinpoint and unreactive III,IV, VI: ptosis not present, extra-ocular motions intact bilaterally V,VII: smile symmetric, facial light touch sensation normal bilaterally VIII: hearing normal bilaterally IX,X: gag reflex present XI: bilateral shoulder shrug XII: midline tongue extension Motor: Able to lift all extremities  Sensory: Pinprick and light touch intact throughout, bilaterally Deep Tendon Reflexes: 2+ and symmetric with absent ankle jerks bilaterally Plantars: Right: mute   Left: mute   Laboratory Studies:   Basic Metabolic Panel:  Recent Labs Lab 04/09/15 0953  NA 138  K 4.0  CL 103  CO2 31  GLUCOSE 94  BUN 17  CREATININE 0.93  CALCIUM 9.0  Liver Function Tests:  Recent Labs Lab 04/09/15 0953  AST 16  ALT 5*  ALKPHOS 50  BILITOT 0.3  PROT 7.0  ALBUMIN 3.3*   No results for input(s): LIPASE, AMYLASE in the last 168 hours. No results for input(s): AMMONIA in the last 168 hours.  CBC:  Recent Labs Lab 04/09/15 0953  WBC 5.3  NEUTROABS 3.1  HGB 11.7*  HCT 36.7  MCV 88.6  PLT 276    Cardiac Enzymes: No results for input(s): CKTOTAL, CKMB, CKMBINDEX, TROPONINI in the last 168 hours.  BNP: Invalid input(s): POCBNP  CBG: No results for input(s): GLUCAP in the last 168 hours.  Microbiology: Results for orders placed or performed in visit on 02/23/15  Stool Culture      Status: None   Collection Time: 02/23/15  9:52 AM  Result Value Ref Range Status   Organism ID, Bacteria No Salmonella,Shigella,Campylobacter,Yersinia,or  Final   Organism ID, Bacteria No E.coli 0157:H7 isolated.  Final    Coagulation Studies: No results for input(s): LABPROT, INR in the last 72 hours.  Urinalysis:  Recent Labs Lab 04/09/15 1355  COLORURINE YELLOW  LABSPEC 1.019  PHURINE 6.5  GLUCOSEU NEGATIVE  HGBUR NEGATIVE  BILIRUBINUR NEGATIVE  KETONESUR NEGATIVE  PROTEINUR NEGATIVE  UROBILINOGEN 0.2  NITRITE NEGATIVE  LEUKOCYTESUR NEGATIVE    Lipid Panel:  No results found for: CHOL, TRIG, HDL, CHOLHDL, VLDL, LDLCALC  HgbA1C: No results found for: HGBA1C  Urine Drug Screen:  No results found for: LABOPIA, COCAINSCRNUR, LABBENZ, AMPHETMU, THCU, LABBARB  Alcohol Level: No results for input(s): ETH in the last 168 hours.  Other results: EKG: sinus rhythm at 88 bpm, LAD.  Imaging: Dg Chest 2 View  04/09/2015   CLINICAL DATA:  Initial encounter for dyspnea and COPD.  EXAM: CHEST  2 VIEW  COMPARISON:  02/22/2015.  FINDINGS: Hyperexpansion is consistent with emphysema. No airspace pulmonary edema. No pleural effusion. Stable scarring at the left costophrenic sulcus. Interval increase in retrocardiac patchy airspace disease. Old right rib fractures are evident. Bones are diffusely demineralized. Cardiopericardial silhouette is at upper limits of normal for size.  IMPRESSION: Emphysema with new retrocardiac patchy airspace opacity, suspicious for pneumonia.   Electronically Signed   By: Misty Stanley M.D.   On: 04/09/2015 10:29   Ct Head Wo Contrast  04/09/2015   CLINICAL DATA:  81YO female Pt arrives from [facility]. Pt has a hx of Parkinson's and told staff yesterday she felt as if she was going to go into a Parkinson's Freeze. At 0700 this morning pt became altered and hypoxic and was unable to speak or move.  EXAM: CT HEAD WITHOUT CONTRAST  TECHNIQUE: Contiguous  axial images were obtained from the base of the skull through the vertex without intravenous contrast.  COMPARISON:  06/25/2014  FINDINGS: Ventricles are normal configuration. There is ventricular and sulcal enlargement reflecting age appropriate volume loss. There are no parenchymal masses or mass effect. There is no evidence of a cortical infarct. Patchy white matter hypoattenuation is noted consistent with mild chronic microvascular ischemic change.  There are no extra-axial masses or abnormal fluid collections.  There is no intracranial hemorrhage.  Mild left maxillary sinus mucosal thickening. Remaining visualized sinuses and the mastoid air cells are clear. No skull lesion.  IMPRESSION: 1. No acute intracranial abnormalities. 2. Age related volume loss. Mild chronic microvascular ischemic change.   Electronically Signed   By: Lajean Manes M.D.   On: 04/09/2015 10:46   Dg Humerus  Right  04/09/2015   CLINICAL DATA:  PT HAD FALL.PAIN PROXIMAL RIGHT HUMERUS.PT COULD NOT LAY FLAT  EXAM: RIGHT HUMERUS - 2+ VIEW  COMPARISON:  06/25/2014  FINDINGS: There is marked chronic change of the right shoulder. Significant subacromial narrowing is associated with flattening of the humeral head and osteophyte formation. Findings are consistent with chronic rotator cuff injury. Slight fragmentation around the humeral head appears chronic. There is no evidence for acute fracture or subluxation.  There are chronic fractures of the right eighth, ninth, and tenth ribs. Bones appear osteopenic.  IMPRESSION: 1. Chronic rotator cuff injury of the right shoulder. No evidence for acute abnormality. 2. Old right rib fractures.   Electronically Signed   By: Nolon Nations M.D.   On: 04/09/2015 12:59   Dg Hip Unilat With Pelvis 2-3 Views Right  04/09/2015   CLINICAL DATA:  Pain after a fall.  Fall happened 2 days ago.  EXAM: DG HIP (WITH OR WITHOUT PELVIS) 2-3V RIGHT  COMPARISON:  None.  FINDINGS: No fracture. No bone lesion. Bones  are demineralized. The hip joint is normally spaced and aligned. No significant arthropathic change. There are vascular calcifications along the femoral artery.  IMPRESSION: 1. No fracture or acute finding. No significant right hip joint arthropathic change.   Electronically Signed   By: Lajean Manes M.D.   On: 04/09/2015 12:58     Assessment/Plan: 82 year old female with a history of PD presenting after freezing.  Patient now back to baseline.  With one freezing episode in the last 9-12 months this would not warrant a change in PD treatment at this time.  Head CT personally reviewed and shows no acute changes.  No further work up indicated at this time.  Patient to call outpatient neurologist on Monday for any further recommendations.  Alexis Goodell, MD [facility] [phone] 04/09/2015, 2:52 PM

## 2015-04-09 NOTE — ED Notes (Signed)
Pt continues to state she is ready to leave and she is going to escape. Family at bedside and helped calm down the pt.

## 2015-04-09 NOTE — ED Provider Notes (Signed)
CSN: [id number]     Arrival date & time 04/09/15  0919 History   First MD Initiated Contact with Patient 04/09/15 [phone]     Chief Complaint  Patient presents with  . Altered Mental Status     (Consider location/radiation/quality/duration/timing/severity/associated sxs/prior Treatment) Patient is a 81 y.o. female presenting with altered mental status.  Altered Mental Status Presenting symptoms: unresponsiveness   Severity:  Moderate Most recent episode:  Today Episode history:  Continuous Duration:  30 minutes Timing:  Constant Progression:  Resolved Chronicity:  Recurrent Context comment:  Parkinsons dz, ho similar event Associated symptoms comment:  Hypoxia to mid 80s on room air   Past Medical History  Diagnosis Date  . Cancer   . Parkinson's disease   . Osteoporosis   . Vitamin D deficiency   . Dementia   . Ventral hernia   . Pernicious anemia   . COPD (chronic obstructive pulmonary disease)    Past Surgical History  Procedure Laterality Date  . Colon surgery      bowel resection  . Breast surgery Left    Family History  Problem Relation Age of Onset  . Heart disease Mother    Social History  Substance Use Topics  . Smoking status: Never Smoker   . Smokeless tobacco: Never Used  . Alcohol Use: Yes     Comment: glass of wine maybe once a week   OB History    No data available     Review of Systems  All other systems reviewed and are negative.     Allergies  Zithromax; Iodine; Ivp dye; and Shellfish allergy  Home Medications   Prior to Admission medications   Medication Sig Start Date End Date Taking? Authorizing Provider  budesonide (PULMICORT) 0.5 MG/2ML nebulizer solution Take 2 mLs (0.5 mg total) by nebulization 2 (two) times daily. 03/26/15  Yes Rigoberto Noel, MD  Calcium Carb-Cholecalciferol (CALCIUM 600+D3) 600-800 MG-UNIT TABS Take 1 tablet by mouth 2 (two) times daily.   Yes Historical Provider, MD  Carbidopa-Levodopa ER (RYTARY) 48.75-195  MG CPCR Take 1 tablet by mouth 3 (three) times daily.   Yes Historical Provider, MD  cholecalciferol (VITAMIN D) 1000 UNITS tablet Take 3,000 Units by mouth daily.    Yes Historical Provider, MD  cyanocobalamin (,VITAMIN B-12,) 1000 MCG/ML injection every 30 (thirty) days.   Yes Historical Provider, MD  ENSURE PLUS (ENSURE PLUS) LIQD Take 237 mLs by mouth daily.   Yes Historical Provider, MD  ferrous sulfate 300 (60 FE) MG/5ML syrup Take 600 mg by mouth daily.   Yes Historical Provider, MD  fluticasone (FLONASE) 50 MCG/ACT nasal spray Place 1 spray into both nostrils daily.   Yes Historical Provider, MD  loperamide (IMODIUM) 2 MG capsule Take 2 mg by mouth daily after breakfast.   Yes Historical Provider, MD  rasagiline (AZILECT) 1 MG TABS tablet Take 1 mg by mouth every morning.   Yes Historical Provider, MD  Rivastigmine (EXELON) 13.3 MG/24HR PT24 Place 1 patch onto the skin daily.   Yes Historical Provider, MD  rotigotine (NEUPRO) 4 MG/24HR Place 1 patch onto the skin daily.   Yes Historical Provider, MD  vitamin B-12 (CYANOCOBALAMIN) 1000 MCG tablet Take 1,000 mcg by mouth daily.   Yes Historical Provider, MD  Wheat Dextrin (BENEFIBER PO) Take 1 scoop by mouth daily.   Yes Historical Provider, MD  albuterol (PROVENTIL) (5 MG/ML) 0.5% nebulizer solution Take 0.5 mLs (2.5 mg total) by nebulization 2 (two) times daily. 01/28/15  Elsie Stain, MD  Calcium-Vitamin D-Vitamin K (VIACTIV) 887-579-72 MG-UNT-MCG CHEW Chew 1 tablet by mouth 2 (two) times daily.    Historical Provider, MD  cefdinir (OMNICEF) 300 MG capsule Take 1 capsule (300 mg total) by mouth 2 (two) times daily. 02/22/15   Rigoberto Noel, MD  denosumab (PROLIA) 60 MG/ML SOLN injection Inject 60 mg into the skin every 6 (six) months. Administer in upper arm, thigh, or abdomen    Historical Provider, MD  Lactobacillus (ACIDOPHILUS PROBIOTIC) 10 MG TABS 1 tab PO daily 04/09/15   Debby Freiberg, MD  levofloxacin (LEVAQUIN) 750 MG tablet Take  1 tablet (750 mg total) by mouth daily. X 7 days 04/09/15   Debby Freiberg, MD   BP 130/73 mmHg  Pulse 77  Temp(Src) 97.7 F (36.5 C) (Oral)  Resp 16  Ht 4\' 11"  (1.499 m)  Wt 88 lb (39.917 kg)  BMI 17.76 kg/m2  SpO2 98% Physical Exam  Constitutional: She is oriented to person, place, and time. She appears well-developed and well-nourished.  HENT:  Head: Normocephalic and atraumatic.  Right Ear: External ear normal.  Left Ear: External ear normal.  Eyes: Conjunctivae and EOM are normal. Pupils are equal, round, and reactive to light.  Neck: Normal range of motion. Neck supple.  Cardiovascular: Normal rate, regular rhythm, normal heart sounds and intact distal pulses.   Pulmonary/Chest: Effort normal and breath sounds normal.  Abdominal: Soft. Bowel sounds are normal. There is no tenderness.  Musculoskeletal: Normal range of motion.  Neurological: She is alert and oriented to person, place, and time. She has normal strength and normal reflexes. No cranial nerve deficit or sensory deficit. Coordination normal. GCS eye subscore is 4. GCS verbal subscore is 5. GCS motor subscore is 6.  Skin: Skin is warm and dry.  Vitals reviewed.   ED Course  Procedures (including critical care time) Labs Review Labs Reviewed  CBC WITH DIFFERENTIAL/PLATELET - Abnormal; Notable for the following:    Hemoglobin 11.7 (*)    RDW 23.9 (*)    All other components within normal limits  COMPREHENSIVE METABOLIC PANEL - Abnormal; Notable for the following:    Albumin 3.3 (*)    ALT 5 (*)    GFR calc non Af Amer 53 (*)    Anion gap 4 (*)    All other components within normal limits  BRAIN NATRIURETIC PEPTIDE  URINALYSIS, ROUTINE W REFLEX MICROSCOPIC (NOT AT [facility])  Randolm Idol, ED    Imaging Review Dg Chest 2 View  04/09/2015   CLINICAL DATA:  Initial encounter for dyspnea and COPD.  EXAM: CHEST  2 VIEW  COMPARISON:  02/22/2015.  FINDINGS: Hyperexpansion is consistent with emphysema. No airspace  pulmonary edema. No pleural effusion. Stable scarring at the left costophrenic sulcus. Interval increase in retrocardiac patchy airspace disease. Old right rib fractures are evident. Bones are diffusely demineralized. Cardiopericardial silhouette is at upper limits of normal for size.  IMPRESSION: Emphysema with new retrocardiac patchy airspace opacity, suspicious for pneumonia.   Electronically Signed   By: Misty Stanley M.D.   On: 04/09/2015 10:29   Ct Head Wo Contrast  04/09/2015   CLINICAL DATA:  81YO female Pt arrives from [facility]. Pt has a hx of Parkinson's and told staff yesterday she felt as if she was going to go into a Parkinson's Freeze. At 0700 this morning pt became altered and hypoxic and was unable to speak or move.  EXAM: CT HEAD WITHOUT CONTRAST  TECHNIQUE:  Contiguous axial images were obtained from the base of the skull through the vertex without intravenous contrast.  COMPARISON:  06/25/2014  FINDINGS: Ventricles are normal configuration. There is ventricular and sulcal enlargement reflecting age appropriate volume loss. There are no parenchymal masses or mass effect. There is no evidence of a cortical infarct. Patchy white matter hypoattenuation is noted consistent with mild chronic microvascular ischemic change.  There are no extra-axial masses or abnormal fluid collections.  There is no intracranial hemorrhage.  Mild left maxillary sinus mucosal thickening. Remaining visualized sinuses and the mastoid air cells are clear. No skull lesion.  IMPRESSION: 1. No acute intracranial abnormalities. 2. Age related volume loss. Mild chronic microvascular ischemic change.   Electronically Signed   By: Lajean Manes M.D.   On: 04/09/2015 10:46   Dg Humerus Right  04/09/2015   CLINICAL DATA:  PT HAD FALL.PAIN PROXIMAL RIGHT HUMERUS.PT COULD NOT LAY FLAT  EXAM: RIGHT HUMERUS - 2+ VIEW  COMPARISON:  06/25/2014  FINDINGS: There is marked chronic change of the right shoulder. Significant  subacromial narrowing is associated with flattening of the humeral head and osteophyte formation. Findings are consistent with chronic rotator cuff injury. Slight fragmentation around the humeral head appears chronic. There is no evidence for acute fracture or subluxation.  There are chronic fractures of the right eighth, ninth, and tenth ribs. Bones appear osteopenic.  IMPRESSION: 1. Chronic rotator cuff injury of the right shoulder. No evidence for acute abnormality. 2. Old right rib fractures.   Electronically Signed   By: Nolon Nations M.D.   On: 04/09/2015 12:59   Dg Hip Unilat With Pelvis 2-3 Views Right  04/09/2015   CLINICAL DATA:  Pain after a fall.  Fall happened 2 days ago.  EXAM: DG HIP (WITH OR WITHOUT PELVIS) 2-3V RIGHT  COMPARISON:  None.  FINDINGS: No fracture. No bone lesion. Bones are demineralized. The hip joint is normally spaced and aligned. No significant arthropathic change. There are vascular calcifications along the femoral artery.  IMPRESSION: 1. No fracture or acute finding. No significant right hip joint arthropathic change.   Electronically Signed   By: Lajean Manes M.D.   On: 04/09/2015 12:58   I have personally reviewed and evaluated these images and lab results as part of my medical decision-making.   EKG Interpretation   Date/Time:  Friday April 09 2015 09:26:33 EDT Ventricular Rate:  88 PR Interval:  194 QRS Duration: 79 QT Interval:  401 QTC Calculation: 485 R Axis:   -41 Text Interpretation:  Sinus rhythm Left axis deviation Nonspecific T  abnrm, anterolateral leads Borderline prolonged QT interval rate has  decreased since last tracing Confirmed by Debby Freiberg [phone]) on  04/09/2015 9:48:37 AM      MDM   Final diagnoses:  Parkinson disease  Community acquired pneumonia    81 y.o. female with pertinent PMH of parkinson's disease, COPD presents with episode of decreased responsiveness with retained postural tone.  Has a ho similar events in  the past with parkinsons.  On arrival here pt asymptomatic with exception of very mild confusion with orientation x 2 (thinks it is 2017).  No focal neuro deficits.    Wu as above.  Consulted neurology who evaluated pt and feel her stable for dc.  DC home in stable condition, likely parkinson's freeze.  I have reviewed all laboratory and imaging studies if ordered as above  1. Parkinson disease   2. Community acquired pneumonia  Debby Freiberg, MD 04/10/15 [phone]

## 2015-04-09 NOTE — Discharge Instructions (Signed)
Parkinson Disease Parkinson disease is a disorder of the central nervous system, which includes the brain and spinal cord. A person with this disease slowly loses the ability to completely control body movements. Within the brain, there is a group of nerve cells (basal ganglia) that help control movement. The basal ganglia are damaged and do not work properly in a person with Parkinson disease. In addition, the basal ganglia produce and use a brain chemical called dopamine. The dopamine chemical sends messages to other parts of the body to control and coordinate body movements. Dopamine levels are low in a person with Parkinson disease. If the dopamine levels are low, then the body does not receive the correct messages it needs to move normally.  CAUSES  The exact reason why the basal ganglia get damaged is not known. Some medical researchers have thought that infection, genes, environment, and certain medicines may contribute to the cause.  SYMPTOMS   An early symptom of Parkinson disease is often an uncontrolled shaking (tremor) of the hands. The tremor will often disappear when the affected hand is consciously used.  As the disease progresses, walking, talking, getting out of a chair, and new movements become more difficult.  Muscles get stiff and movements become slower.  Balance and coordination become harder.  Depression, trouble swallowing, urinary problems, constipation, and sleep problems can occur.  Later in the disease, memory and thought processes may deteriorate. DIAGNOSIS  There are no specific tests to diagnose Parkinson disease. You may be referred to a neurologist for evaluation. Your caregiver will ask about your medical history, symptoms, and perform a physical exam. Blood tests and imaging tests of your brain may be performed to rule out other diseases. The imaging tests may include an MRI or a CT scan. TREATMENT  The goal of treatment is to relieve symptoms. Medicines may be  prescribed once the symptoms become troublesome. Medicine will not stop the progression of the disease, but medicine can make movement and balance better and help control tremors. Speech and occupational therapy may also be prescribed. Sometimes, surgical treatment of the brain can be done in young people. HOME CARE INSTRUCTIONS  Get regular exercise and rest periods during the day to help prevent exhaustion and depression.  If getting dressed becomes difficult, replace buttons and zippers with Velcro and elastic on your clothing.  Take all medicine as directed by your caregiver.  Install grab bars or railings in your home to prevent falls.  Go to speech or occupational therapy as directed.  Keep all follow-up visits as directed by your caregiver. SEEK MEDICAL CARE IF:  Your symptoms are not controlled with your medicine.  You fall.  You have trouble swallowing or choke on your food. MAKE SURE YOU:  Understand these instructions.  Will watch your condition.  Will get help right away if you are not doing well or get worse. Document Released: 06/30/2000 Document Revised: 10/28/2012 Document Reviewed: 08/02/2011 [facility] Patient Information 2015 [place], [place]. This information is not intended to replace advice given to you by your health care provider. Make sure you discuss any questions you have with your health care provider. Pneumonia Pneumonia is an infection of the lungs.  CAUSES Pneumonia may be caused by bacteria or a virus. Usually, these infections are caused by breathing infectious particles into the lungs (respiratory tract). SIGNS AND SYMPTOMS   Cough.  Fever.  Chest pain.  Increased rate of breathing.  Wheezing.  Mucus production. DIAGNOSIS  If you have the common symptoms  of pneumonia, your health care provider will typically confirm the diagnosis with a chest X-ray. The X-ray will show an abnormality in the lung (pulmonary infiltrate) if you have  pneumonia. Other tests of your blood, urine, or sputum may be done to find the specific cause of your pneumonia. Your health care provider may also do tests (blood gases or pulse oximetry) to see how well your lungs are working. TREATMENT  Some forms of pneumonia may be spread to other people when you cough or sneeze. You may be asked to wear a mask before and during your exam. Pneumonia that is caused by bacteria is treated with antibiotic medicine. Pneumonia that is caused by the influenza virus may be treated with an antiviral medicine. Most other viral infections must run their course. These infections will not respond to antibiotics.  HOME CARE INSTRUCTIONS   Cough suppressants may be used if you are losing too much rest. However, coughing protects you by clearing your lungs. You should avoid using cough suppressants if you can.  Your health care provider may have prescribed medicine if he or she thinks your pneumonia is caused by bacteria or influenza. Finish your medicine even if you start to feel better.  Your health care provider may also prescribe an expectorant. This loosens the mucus to be coughed up.  Take medicines only as directed by your health care provider.  Do not smoke. Smoking is a common cause of bronchitis and can contribute to pneumonia. If you are a smoker and continue to smoke, your cough may last several weeks after your pneumonia has cleared.  A cold steam vaporizer or humidifier in your room or home may help loosen mucus.  Coughing is often worse at night. Sleeping in a semi-upright position in a recliner or using a couple pillows under your head will help with this.  Get rest as you feel it is needed. Your body will usually let you know when you need to rest. PREVENTION A pneumococcal shot (vaccine) is available to prevent a common bacterial cause of pneumonia. This is usually suggested for:  People over 79 years old.  Patients on chemotherapy.  People with  chronic lung problems, such as bronchitis or emphysema.  People with immune system problems. If you are over 65 or have a high risk condition, you may receive the pneumococcal vaccine if you have not received it before. In some countries, a routine influenza vaccine is also recommended. This vaccine can help prevent some cases of pneumonia.You may be offered the influenza vaccine as part of your care. If you smoke, it is time to quit. You may receive instructions on how to stop smoking. Your health care provider can provide medicines and counseling to help you quit. SEEK MEDICAL CARE IF: You have a fever. SEEK IMMEDIATE MEDICAL CARE IF:   Your illness becomes worse. This is especially true if you are elderly or weakened from any other disease.  You cannot control your cough with suppressants and are losing sleep.  You begin coughing up blood.  You develop pain which is getting worse or is uncontrolled with medicines.  Any of the symptoms which initially brought you in for treatment are getting worse rather than better.  You develop shortness of breath or chest pain. MAKE SURE YOU:   Understand these instructions.  Will watch your condition.  Will get help right away if you are not doing well or get worse. Document Released: 07/03/2005 Document Revised: 11/17/2013 Document Reviewed: 09/22/2010 [employer] Patient  Information 2015 [place], [place]. This information is not intended to replace advice given to you by your health care provider. Make sure you discuss any questions you have with your health care provider.

## 2015-04-09 NOTE — ED Notes (Signed)
Pt arrives from [facility]. Pt has a hx of Parkinson's and told staff yesterday she felt as if she was going to go into a Parkinson's Freeze. At 0700 this morning pt became altered and hypoxic and was unable to speak or move. Pt was at 84% on RA upon EMS arrival. Upon arrival to ED pt has returned to baseline.

## 2015-04-09 NOTE — ED Notes (Signed)
Pt is in stable condition upon d/c and is escorted from ED via wheelchair. 

## 2015-04-29 ENCOUNTER — Encounter: Payer: Self-pay | Admitting: Internal Medicine

## 2015-04-29 ENCOUNTER — Ambulatory Visit (INDEPENDENT_AMBULATORY_CARE_PROVIDER_SITE_OTHER): Payer: Medicare Other | Admitting: Internal Medicine

## 2015-04-29 VITALS — BP 108/58 | HR 87 | Ht 59.0 in | Wt 85.2 lb

## 2015-04-29 DIAGNOSIS — J479 Bronchiectasis, uncomplicated: Secondary | ICD-10-CM

## 2015-04-29 DIAGNOSIS — J471 Bronchiectasis with (acute) exacerbation: Secondary | ICD-10-CM

## 2015-04-29 HISTORY — DX: Bronchiectasis, uncomplicated: J47.9

## 2015-04-29 MED ORDER — FLUTTER DEVI: Status: DC

## 2015-04-29 NOTE — Assessment & Plan Note (Signed)
EPIC record / images reviewed > she has carried this dx for years but no images to document it - moot issue really at this point and best rx is empirical  Bud/alb bid plus prn alb  Flutter valve  I had an extended discussion with the patient and husband  reviewing all relevant studies completed to date and  lasting 15 to 20 minutes of a 25 minute visit    Each maintenance medication was reviewed in detail including most importantly the difference between maintenance and prns and under what circumstances the prns are to be triggered using an action plan format that is not reflected in the computer generated alphabetically organized AVS.    Please see instructions for details which were reviewed in writing and the patient given a copy highlighting the part that I personally wrote and discussed at today's ov.

## 2015-04-29 NOTE — Progress Notes (Signed)
   Subjective:    Patient ID: Shannon Dunn, female    DOB: [birth year]    MRN: [id number]    Brief patient profile:  38 yowf never smoker with documented bronchiectasis followed previously followed by Dr Joya Gaskins    History of Present Illness  04/29/2015 1st  office visit/ Shannon Dunn re:  Chief Complaint  Patient presents with  . Follow-up    PW pt. Pt c/o of wet cough with mostly clear mucus, sometimes darker first thing in the AM, and also constant chest congestion with a "gurgling" sound and choking sensation. Pt also c/o of wheeze and SOB. Pt has finished abx.      no obvious swallowing problems at present/ no purulent sputum or cp    No obvious day to day or daytime variability or assoc  chest tightness, subjective wheeze or overt sinus or hb symptoms. No unusual exp hx or h/o childhood pna/ asthma or knowledge of premature birth.  Sleeping ok without nocturnal  or early am exacerbation  of respiratory  c/o's or need for noct saba. Also denies any obvious fluctuation of symptoms with weather or environmental changes or other aggravating or alleviating factors except as outlined above   Current Medications, Allergies, Complete Past Medical History, Past Surgical History, Family History, and Social History were reviewed in [employer] record.  ROS  The following are not active complaints unless bolded sore throat, dysphagia, dental problems, itching, sneezing,  nasal congestion or excess/ purulent secretions, ear ache,   fever, chills, sweats, unintended wt loss, classically pleuritic or exertional cp, hemoptysis,  orthopnea pnd or leg swelling, presyncope, palpitations, abdominal pain, anorexia, nausea, vomiting, diarrhea  or change in bowel or bladder habits, change in stools or urine, dysuria,hematuria,  rash, arthralgias, visual complaints, headache, numbness, weakness or ataxia or problems with walking or coordination,  change in mood/affect or memory.               Objective:  Physical Exam  amb very frail  elderly wf nad  Wt Readings from Last 3 Encounters:  04/29/15 85 lb 3.2 oz (38.646 kg)  04/09/15 88 lb (39.917 kg)  02/22/15 88 lb 12.8 oz (40.279 kg)    Vital signs reviewed    Gen: Pleasant, thin , in no distress, somber  ENT: No lesions,  mouth clear,  oropharynx clear, no postnasal drip  Neck: No JVD, no TMG, no carotid bruits  Lungs: No use of accessory muscles, no dullness to percussion, distant bs bilaterally s wheezes or rhonchi  Cardiovascular: RRR, heart sounds normal, no murmur or gallops, 1-2+ pitting bilateral lower ext   edema  Abdomen: soft and NT, no HSM,  BS normal  Musculoskeletal: No deformities, no cyanosis or clubbing  Neuro: alert, non focal  Skin: echhymoses on skin    I personally reviewed images and agree with radiology impression as follows:  CXR:  04/09/15   Emphysema with new retrocardiac patchy airspace opacity, suspicious for pneumonia.       Assessment & Plan:

## 2015-04-29 NOTE — Patient Instructions (Signed)
Automatically take budesonide 0.25 mg twice daily with albuterol 2.5 mg per neb   As needed for sob / bad cough ok to use albuterol 2.5 mg every 4 hours as needed  Use the flutter valve as much as possible   If you are satisfied with your treatment plan,  let your doctor know and he/she can either refill your medications or you can return here when your prescription runs out.     If in any way you are not 100% satisfied,  please tell us.  If 100% better, tell your friends!  Pulmonary follow up is as needed

## 2015-04-29 NOTE — Assessment & Plan Note (Addendum)
EPIC record / images reviewed > she has carried this dx for years but no images to document it but no reason to doubt it- moot issue really at this point and best rx is empirical  Bud/alb bid plus prn alb  Flutter valve  I had an extended discussion with the patient and husband  reviewing all relevant studies completed to date and  lasting 15 to 20 minutes of a 25 minute visit    Each maintenance medication was reviewed in detail including most importantly the difference between maintenance and prns and under what circumstances the prns are to be triggered using an action plan format that is not reflected in the computer generated alphabetically organized AVS.    Please see instructions for details which were reviewed in writing and the patient given a copy highlighting the part that I personally wrote and discussed at today's ov.

## 2015-05-10 ENCOUNTER — Encounter: Payer: Self-pay | Admitting: Internal Medicine

## 2015-05-10 MED ORDER — SULFAMETHOXAZOLE-TRIMETHOPRIM 200-40 MG/5ML PO SUSP: 20.0000 mL | Freq: Two times a day (BID) | ORAL | Status: DC

## 2015-05-10 NOTE — Telephone Encounter (Signed)
Called and spoke to pt's husband. Pt c/o prod cough with green mucus x 4 days. Pt denies SOB, CP/tightness, f/c/s. Pt states she is using her flutter valve as directed by Dr. Melvyn Novas. Pt's husband states the daughter is pharmacist and states she has used Septra in the past and it has worked well.   Dr. Melvyn Novas please advise. Thanks.

## 2015-05-17 ENCOUNTER — Telehealth: Payer: Self-pay | Admitting: Internal Medicine

## 2015-05-17 NOTE — Telephone Encounter (Signed)
Error she called the wrong doctor

## 2015-06-29 ENCOUNTER — Encounter: Payer: Self-pay | Admitting: Internal Medicine

## 2015-06-29 NOTE — Telephone Encounter (Signed)
MW please advise.  Thanks.  

## 2015-07-16 ENCOUNTER — Encounter (HOSPITAL_COMMUNITY): Payer: Self-pay

## 2015-07-16 ENCOUNTER — Emergency Department (HOSPITAL_COMMUNITY): Payer: Medicare Other

## 2015-07-16 ENCOUNTER — Inpatient Hospital Stay (HOSPITAL_COMMUNITY)
Admission: EM | Admit: 2015-07-16 | Discharge: 2015-07-20 | DRG: 190 | Disposition: A | Discharge status: Home Health | Payer: Medicare Other | Attending: Pulmonary Disease | Admitting: Pulmonary Disease

## 2015-07-16 DIAGNOSIS — J9601 Acute respiratory failure with hypoxia: Secondary | ICD-10-CM

## 2015-07-16 DIAGNOSIS — M810 Age-related osteoporosis without current pathological fracture: Secondary | ICD-10-CM | POA: Diagnosis present

## 2015-07-16 DIAGNOSIS — J479 Bronchiectasis, uncomplicated: Secondary | ICD-10-CM

## 2015-07-16 DIAGNOSIS — J470 Bronchiectasis with acute lower respiratory infection: Principal | ICD-10-CM

## 2015-07-16 DIAGNOSIS — F0280 Dementia in other diseases classified elsewhere without behavioral disturbance: Secondary | ICD-10-CM | POA: Diagnosis present

## 2015-07-16 DIAGNOSIS — Z8249 Family history of ischemic heart disease and other diseases of the circulatory system: Secondary | ICD-10-CM

## 2015-07-16 DIAGNOSIS — G20 Parkinson's disease: Secondary | ICD-10-CM | POA: Diagnosis not present

## 2015-07-16 DIAGNOSIS — E559 Vitamin D deficiency, unspecified: Secondary | ICD-10-CM | POA: Diagnosis present

## 2015-07-16 DIAGNOSIS — Z7951 Long term (current) use of inhaled steroids: Secondary | ICD-10-CM | POA: Diagnosis not present

## 2015-07-16 DIAGNOSIS — R0600 Dyspnea, unspecified: Secondary | ICD-10-CM

## 2015-07-16 DIAGNOSIS — E872 Acidosis: Secondary | ICD-10-CM | POA: Diagnosis present

## 2015-07-16 DIAGNOSIS — R5381 Other malaise: Secondary | ICD-10-CM

## 2015-07-16 DIAGNOSIS — Z66 Do not resuscitate: Secondary | ICD-10-CM | POA: Diagnosis present

## 2015-07-16 DIAGNOSIS — Y95 Nosocomial condition: Secondary | ICD-10-CM | POA: Diagnosis present

## 2015-07-16 DIAGNOSIS — E876 Hypokalemia: Secondary | ICD-10-CM | POA: Diagnosis present

## 2015-07-16 DIAGNOSIS — D510 Vitamin B12 deficiency anemia due to intrinsic factor deficiency: Secondary | ICD-10-CM | POA: Diagnosis present

## 2015-07-16 DIAGNOSIS — J189 Pneumonia, unspecified organism: Secondary | ICD-10-CM

## 2015-07-16 DIAGNOSIS — J449 Chronic obstructive pulmonary disease, unspecified: Secondary | ICD-10-CM

## 2015-07-16 DIAGNOSIS — Z85038 Personal history of other malignant neoplasm of large intestine: Secondary | ICD-10-CM

## 2015-07-16 DIAGNOSIS — J9621 Acute and chronic respiratory failure with hypoxia: Secondary | ICD-10-CM | POA: Diagnosis not present

## 2015-07-16 DIAGNOSIS — J471 Bronchiectasis with (acute) exacerbation: Secondary | ICD-10-CM | POA: Diagnosis not present

## 2015-07-16 HISTORY — DX: Malignant neoplasm of nipple and areola, unspecified female breast: C50.019

## 2015-07-16 HISTORY — DX: Malignant neoplasm of colon, unspecified: C18.9

## 2015-07-16 HISTORY — DX: Pneumonia, unspecified organism: J18.9

## 2015-07-16 LAB — BLOOD GAS, ARTERIAL
Acid-Base Excess: 2.2 mmol/L — ABNORMAL HIGH (ref 0.0–2.0)
BICARBONATE: 27 meq/L — AB (ref 20.0–24.0)
Drawn by: 307971
O2 CONTENT: 4 L/min
O2 Saturation: 92.5 %
PCO2 ART: 45 mmHg (ref 35.0–45.0)
PH ART: 7.395 (ref 7.350–7.450)
PO2 ART: 75.7 mmHg — AB (ref 80.0–100.0)
Patient temperature: 98.6
TCO2: 24.1 mmol/L (ref 0–100)

## 2015-07-16 LAB — BASIC METABOLIC PANEL
Anion gap: 9 (ref 5–15)
BUN: 21 mg/dL — ABNORMAL HIGH (ref 6–20)
CALCIUM: 8.8 mg/dL — AB (ref 8.9–10.3)
CO2: 29 mmol/L (ref 22–32)
CREATININE: 0.96 mg/dL (ref 0.44–1.00)
Chloride: 100 mmol/L — ABNORMAL LOW (ref 101–111)
GFR calc Af Amer: 59 mL/min — ABNORMAL LOW (ref >60)
GFR, EST NON AFRICAN AMERICAN: 51 mL/min — AB (ref >60)
GLUCOSE: 144 mg/dL — AB (ref 65–99)
Potassium: 3.4 mmol/L — ABNORMAL LOW (ref 3.5–5.1)
Sodium: 138 mmol/L (ref 135–145)

## 2015-07-16 LAB — TROPONIN I

## 2015-07-16 LAB — CBC
HCT: 36.2 % (ref 36.0–46.0)
Hemoglobin: 11.6 g/dL — ABNORMAL LOW (ref 12.0–15.0)
MCH: 31 pg (ref 26.0–34.0)
MCHC: 32 g/dL (ref 30.0–36.0)
MCV: 96.8 fL (ref 78.0–100.0)
PLATELETS: 187 10*3/uL (ref 150–400)
RBC: 3.74 MIL/uL — ABNORMAL LOW (ref 3.87–5.11)
RDW: 15.6 % — AB (ref 11.5–15.5)
WBC: 4.4 10*3/uL (ref 4.0–10.5)

## 2015-07-16 LAB — I-STAT CG4 LACTIC ACID, ED: LACTIC ACID, VENOUS: 2.95 mmol/L — AB (ref 0.5–2.0)

## 2015-07-16 LAB — LACTIC ACID, PLASMA: LACTIC ACID, VENOUS: 3.9 mmol/L — AB (ref 0.5–2.0)

## 2015-07-16 LAB — PROCALCITONIN: PROCALCITONIN: 0.23 ng/mL

## 2015-07-16 LAB — MRSA PCR SCREENING: MRSA BY PCR: NEGATIVE

## 2015-07-16 MED ORDER — PIPERACILLIN-TAZOBACTAM 3.375 G IVPB
3.3750 g | Freq: Three times a day (TID) | INTRAVENOUS | Status: DC
  Administered 2015-07-16 – 2015-07-18 (×6): 3.375 g via INTRAVENOUS
  Filled 2015-07-16 (×7): qty 50

## 2015-07-16 MED ORDER — LEVOTHYROXINE SODIUM 25 MCG PO TABS
25.0000 ug | ORAL_TABLET | Freq: Every day | ORAL | Status: DC
  Administered 2015-07-18 – 2015-07-20 (×3): 25 ug via ORAL
  Filled 2015-07-16 (×5): qty 1

## 2015-07-16 MED ORDER — FAMOTIDINE 20 MG PO TABS: 20.0000 mg | ORAL_TABLET | Freq: Two times a day (BID) | ORAL | Status: DC

## 2015-07-16 MED ORDER — FAMOTIDINE 20 MG PO TABS
20.0000 mg | ORAL_TABLET | Freq: Two times a day (BID) | ORAL | Status: DC
  Administered 2015-07-16 – 2015-07-17 (×2): 20 mg via ORAL
  Filled 2015-07-16 (×4): qty 1

## 2015-07-16 MED ORDER — RISAQUAD PO CAPS
1.0000 | ORAL_CAPSULE | Freq: Every day | ORAL | Status: DC
  Administered 2015-07-18 – 2015-07-20 (×3): 1 via ORAL
  Filled 2015-07-16 (×5): qty 1

## 2015-07-16 MED ORDER — ROTIGOTINE 4 MG/24HR TD PT24
1.0000 | MEDICATED_PATCH | Freq: Every day | TRANSDERMAL | Status: DC
  Administered 2015-07-16 – 2015-07-19 (×4): 1 via TRANSDERMAL

## 2015-07-16 MED ORDER — VANCOMYCIN HCL IN DEXTROSE 750-5 MG/150ML-% IV SOLN
750.0000 mg | INTRAVENOUS | Status: AC
  Administered 2015-07-16: 750 mg via INTRAVENOUS
  Filled 2015-07-16: qty 150

## 2015-07-16 MED ORDER — IPRATROPIUM-ALBUTEROL 0.5-2.5 (3) MG/3ML IN SOLN
3.0000 mL | Freq: Four times a day (QID) | RESPIRATORY_TRACT | Status: DC
  Administered 2015-07-16 – 2015-07-19 (×13): 3 mL via RESPIRATORY_TRACT
  Filled 2015-07-16 (×15): qty 3

## 2015-07-16 MED ORDER — RASAGILINE MESYLATE 1 MG PO TABS
1.0000 mg | ORAL_TABLET | Freq: Every day | ORAL | Status: DC
  Administered 2015-07-16 – 2015-07-19 (×4): 1 mg via ORAL
  Filled 2015-07-16 (×5): qty 1

## 2015-07-16 MED ORDER — RIVASTIGMINE 13.3 MG/24HR TD PT24: 1.0000 | MEDICATED_PATCH | Freq: Every day | TRANSDERMAL | Status: DC

## 2015-07-16 MED ORDER — VANCOMYCIN HCL 500 MG IV SOLR
500.0000 mg | INTRAVENOUS | Status: DC
  Administered 2015-07-17 – 2015-07-18 (×2): 500 mg via INTRAVENOUS
  Filled 2015-07-16 (×3): qty 500

## 2015-07-16 MED ORDER — SODIUM CHLORIDE 0.9 % IV SOLN: 250.0000 mL | INTRAVENOUS | Status: DC | PRN

## 2015-07-16 MED ORDER — HEPARIN SODIUM (PORCINE) 5000 UNIT/ML IJ SOLN
5000.0000 [IU] | Freq: Two times a day (BID) | INTRAMUSCULAR | Status: DC
  Administered 2015-07-17 – 2015-07-20 (×7): 5000 [IU] via SUBCUTANEOUS
  Filled 2015-07-16 (×10): qty 1

## 2015-07-16 MED ORDER — SODIUM CHLORIDE 0.9 % IV BOLUS (SEPSIS)
1000.0000 mL | Freq: Once | INTRAVENOUS | Status: AC
  Administered 2015-07-16: 1000 mL via INTRAVENOUS

## 2015-07-16 MED ORDER — CARBIDOPA-LEVODOPA ER 48.75-195 MG PO CPCR
1.0000 | ORAL_CAPSULE | Freq: Three times a day (TID) | ORAL | Status: DC
  Administered 2015-07-16 – 2015-07-20 (×11): 1 via ORAL
  Filled 2015-07-16: qty 1

## 2015-07-16 MED ORDER — RIVASTIGMINE 13.3 MG/24HR TD PT24
1.0000 | MEDICATED_PATCH | Freq: Every day | TRANSDERMAL | Status: DC
  Administered 2015-07-16 – 2015-07-19 (×4): 13.3 mg via TRANSDERMAL
  Filled 2015-07-16 (×5): qty 1

## 2015-07-16 MED ORDER — IPRATROPIUM-ALBUTEROL 0.5-2.5 (3) MG/3ML IN SOLN
3.0000 mL | Freq: Once | RESPIRATORY_TRACT | Status: AC
  Administered 2015-07-16: 3 mL via RESPIRATORY_TRACT
  Filled 2015-07-16: qty 3

## 2015-07-16 MED ORDER — ALBUTEROL SULFATE (2.5 MG/3ML) 0.083% IN NEBU
5.0000 mg | INHALATION_SOLUTION | Freq: Once | RESPIRATORY_TRACT | Status: AC
  Administered 2015-07-16: 5 mg via RESPIRATORY_TRACT
  Filled 2015-07-16: qty 6

## 2015-07-16 MED ORDER — PIPERACILLIN-TAZOBACTAM 3.375 G IVPB 30 MIN
3.3750 g | INTRAVENOUS | Status: AC
  Administered 2015-07-16: 3.375 g via INTRAVENOUS
  Filled 2015-07-16: qty 50

## 2015-07-16 MED ORDER — BUDESONIDE 0.25 MG/2ML IN SUSP
0.2500 mg | Freq: Every morning | RESPIRATORY_TRACT | Status: DC
  Administered 2015-07-17 – 2015-07-20 (×3): 0.25 mg via RESPIRATORY_TRACT
  Filled 2015-07-16 (×4): qty 2

## 2015-07-16 NOTE — ED Notes (Signed)
RN Alaina obtaining labs

## 2015-07-16 NOTE — ED Notes (Signed)
Per Alvino Chapel MD, Pt took home RYTARY (Parkinson's medication).

## 2015-07-16 NOTE — Progress Notes (Signed)
ANTIBIOTIC CONSULT NOTE - INITIAL  Pharmacy Consult for Vancomycin, Zosyn Indication: rule out sepsis  Allergies  Allergen Reactions  . Zithromax [Azithromycin] Diarrhea  . Iodine Other (See Comments)    Unknown, might be rash/hives  . Ivp Dye [Iodinated Diagnostic Agents] Other (See Comments)    Unknown rash/hives maybe  . Shellfish Allergy Other (See Comments)    Unknown rash/hives, scallops     Patient Measurements:   Adjusted Body Weight:   Vital Signs: Temp: 97.7 F (36.5 C) (12/30 1122) Temp Source: Oral (12/30 1122) BP: 150/70 mmHg (12/30 1122) Pulse Rate: 109 (12/30 1122) Intake/Output from previous day:   Intake/Output from this shift:    Labs:  Recent Labs  07/16/15 1207  WBC 4.4  HGB 11.6*  PLT 187   CrCl cannot be calculated (Unknown ideal weight.). No results for input(s): VANCOTROUGH, VANCOPEAK, VANCORANDOM, GENTTROUGH, GENTPEAK, GENTRANDOM, TOBRATROUGH, TOBRAPEAK, TOBRARND, AMIKACINPEAK, AMIKACINTROU, AMIKACIN in the last 72 hours.   Microbiology: No results found for this or any previous visit (from the past 720 hour(s)).  Medical History: Past Medical History  Diagnosis Date  . Cancer ([place])   . Parkinson's disease ([place])   . Osteoporosis   . Vitamin D deficiency   . Dementia   . Ventral hernia   . Pernicious anemia   . COPD (chronic obstructive pulmonary disease) ([facility])     Assessment: Shannon Dunn with dementia and COPD presents with increased shortness of breath and lethargy. Patient's family reports that patient has recently finished antibiotics (Levaquin) for pneumonia.  Lactic acid elevated.  ED CXR shows pneumonia.  Pharmacy consulted to start vancomycin and zosyn for sepsis secondary to pneumonia.  - SCr 0.96, CrCl~24 ml/min (CG) - WBC WNL - Weight was 38.6 kg in 04/2015 - blood cultures ordered  Goal of Therapy:  Vancomycin trough level 15-20 mcg/ml  Doses adjusted per renal function Eradication of infection  Plan:  1.   Vancomycin 750 mg IV x 1 then 500 mg IV q24h. 2.  Zosyn 3.375g IV q8h (4 hour infusion time) for CrCl>20 ml/min. 3.  F/u SCr, culture results, trough levels as indicated.  [name] 07/16/2015,1:01 PM

## 2015-07-16 NOTE — ED Notes (Signed)
Bed: [id number] Expected date:  Expected time:  Means of arrival:  Comments: Ems-shortness of breath

## 2015-07-16 NOTE — ED Notes (Addendum)
Per EMS, Pt, from [place], c/o increased SOB and lethargy starting this morning.  Denies pain.  Facility reports Pt was recently diagnosed w/ PNA and is completing antibiotics (levaquin).  Hx of dementia and COPD.

## 2015-07-16 NOTE — ED Notes (Signed)
Dr. Wilson Singer and RN notified of pt's Lactic Acid of 2.95

## 2015-07-16 NOTE — H&P (Signed)
Name: Shannon Dunn MRN: [id number] DOB: 1924-03-15    ADMISSION DATE:  07/16/2015  REFERRING MD :  Davonna Belling, M.D. / EDP  CHIEF COMPLAINT:  Shortness of Breath  BRIEF PATIENT DESCRIPTION: 81 year old female who resides in SNF. Previous patient of PW with bronchiectasis.   SIGNIFICANT EVENTS  12/30 - Admit  STUDIES:  PFT 03/24/13:  FVC 1.01 L (57%) FEV1 0.58 L (46%) FEV1/FVC 0.57 FEF 25-75 0.32 L (34%) CXR PA/LAT 07/16/15:  Personally reviewed by me. Opacity in lingula. No pleural effusion apprecaiated.  MICROBIOLOGY: Blood Ctx x2 12/30>>>  ANTIBIOTICS: Zosyn 12/30>>> Vancomycin 12/30>>>  LINES/TUBES: PIV x1  HISTORY OF PRESENT ILLNESS:  Patient has known dementia and history was obtained from the patient's electronic medical record as well as her husband via phone. Patient presented from her skilled nursing facility with shortness of breath and known history of COPD/bronchiectasis. Reportedly the patient has had a cough as well as low oxygen saturations today. Also noted the patient has had recent prior treatment for pneumonia. Her husband reports she has been more lethargic over the last day or so. He reports decreased oral intake last night as he did have to feed her dinner. Her husband reports that he has had a respiratory illness starting approximately 1 week ago and has been treated with amoxicillin. He denies any other known history. The patient herself denies any pain or difficulty breathing. She also denies any nausea at present. In the emergency department she was started on broad-spectrum antibiotics with vancomycin and Zosyn to cover for healthcare associated organisms.  PAST MEDICAL HISTORY :  Past Medical History  Diagnosis Date  . Colon cancer ([place])   . Parkinson's disease ([place])   . Osteoporosis   . Vitamin D deficiency   . Dementia   . Ventral hernia   . Pernicious anemia   . COPD (chronic obstructive pulmonary disease) ([place])   . Pagets disease, breast  ([place])    PAST SURGICAL HISTORY: Past Surgical History  Procedure Laterality Date  . Colon surgery      bowel resection  . Breast surgery Left     Prior to Admission medications   Medication Sig Start Date End Date Taking? Authorizing Provider  albuterol (PROVENTIL) (5 MG/ML) 0.5% nebulizer solution Take 0.5 mLs (2.5 mg total) by nebulization 2 (two) times daily. 01/28/15  Yes Elsie Stain, MD  budesonide (PULMICORT) 0.25 MG/2ML nebulizer solution Take 0.25 mg by nebulization every morning.   Yes Historical Provider, MD  Calcium-Vitamin D (CALTRATE 600 PLUS-VIT D PO) Take 1 tablet by mouth 2 (two) times daily.   Yes Historical Provider, MD  Carbidopa-Levodopa ER (RYTARY) 48.75-195 MG CPCR Take 1 tablet by mouth 3 (three) times daily.   Yes Historical Provider, MD  cholecalciferol (VITAMIN D) 1000 UNITS tablet Take 3,000 Units by mouth daily.    Yes Historical Provider, MD  cyanocobalamin (,VITAMIN B-12,) 1000 MCG/ML injection every 30 (thirty) days.   Yes Historical Provider, MD  denosumab (PROLIA) 60 MG/ML SOLN injection Inject 60 mg into the skin every 6 (six) months. Administer in upper arm, thigh, or abdomen   Yes Historical Provider, MD  ENSURE PLUS (ENSURE PLUS) LIQD Take 237 mLs by mouth daily.   Yes Historical Provider, MD  fluticasone (FLONASE) 50 MCG/ACT nasal spray Place 1 spray into both nostrils daily.   Yes Historical Provider, MD  GuaiFENesin (DIABETIC TUSSIN EX PO) Take 5 mLs by mouth every 6 (six) hours as needed (congestion/cough.).  Yes Historical Provider, MD  ibuprofen (ADVIL,MOTRIN) 400 MG tablet Take 400 mg by mouth every 6 (six) hours as needed for mild pain or moderate pain.    Yes Historical Provider, MD  levofloxacin (LEVAQUIN) 500 MG tablet Take 500 mg by mouth daily.   Yes Historical Provider, MD  levothyroxine (SYNTHROID, LEVOTHROID) 25 MCG tablet Take 25 mcg by mouth daily before breakfast.   Yes Historical Provider, MD  loperamide (IMODIUM) 2 MG capsule  Take 2 mg by mouth daily after breakfast.   Yes Historical Provider, MD  Probiotic Product (RISAQUAD PO) Take 1 capsule by mouth daily.   Yes Historical Provider, MD  ranitidine (ZANTAC) 75 MG tablet Take 75 mg by mouth 2 (two) times daily.   Yes Historical Provider, MD  rasagiline (AZILECT) 1 MG TABS tablet Take 1 mg by mouth daily. 1400.   Yes Historical Provider, MD  Rivastigmine (EXELON) 13.3 MG/24HR PT24 Place 1 patch onto the skin daily.   Yes Historical Provider, MD  rotigotine (NEUPRO) 4 MG/24HR Place 1 patch onto the skin daily.   Yes Historical Provider, MD  Wheat Dextrin (BENEFIBER PO) Take 1 scoop by mouth daily.   Yes Historical Provider, MD  Respiratory Therapy Supplies (FLUTTER) DEVI Use as directed 04/29/15   Tanda Rockers, MD  sulfamethoxazole-trimethoprim (BACTRIM,SEPTRA) 200-40 MG/5ML suspension Take 20 mLs by mouth 2 (two) times daily. 05/10/15   Tanda Rockers, MD   Allergies  Allergen Reactions  . Zithromax [Azithromycin] Diarrhea  . Iodine Other (See Comments)    Unknown, might be rash/hives  . Ivp Dye [Iodinated Diagnostic Agents] Other (See Comments)    Unknown rash/hives maybe  . Shellfish Allergy Other (See Comments)    Unknown rash/hives, scallops     FAMILY HISTORY:  Family History  Problem Relation Age of Onset  . Heart disease Mother   . Alzheimer's disease Father     SOCIAL HISTORY: Social History   Social History  . Marital Status: Married    Spouse Name: N/A  . Number of Children: N/A  . Years of Education: N/A   Occupational History  . Retired     [employer]   Social History Main Topics  . Smoking status: Never Smoker   . Smokeless tobacco: Never Used  . Alcohol Use: Yes     Comment: glass of wine maybe once a week  . Drug Use: No  . Sexual Activity: Not Asked   Other Topics Concern  . None   Social History Narrative   Patient currently lives at a local skilled nursing facility in a dementia care unit. Husband lives with in same  assisted-living community.    REVIEW OF SYSTEMS:  Unable to obtain an accurate review of systems given dementia.   SUBJECTIVE:   VITAL SIGNS: Temp:  [97.7 F (36.5 C)-99.5 F (37.5 C)] 99.5 F (37.5 C) (12/30 1450) Pulse Rate:  [94-109] 94 (12/30 1500) Resp:  [16-21] 16 (12/30 1500) BP: (86-150)/(44-70) 86/48 mmHg (12/30 1500) SpO2:  [82 %-100 %] 96 % (12/30 1500)  PHYSICAL EXAMINATION: General:  Awake. Alert. No acute distress. Elderly female.  Integument:  Warm & dry. No rash on exposed skin.  Lymphatics:  No appreciated cervical or supraclavicular lymphadenoapthy. HEENT:  Moist mucus membranes. No oral ulcers. No scleral injection or icterus.  Cardiovascular:  Regular rate. No edema. No appreciable JVD.  Pulmonary:  Coarse breath sounds bilatearlly. Normal work of breathing on supplemental oxygen. Abdomen: Soft. Normal bowel sounds. Nondistended. Grossly nontender. Musculoskeletal:  Hand grip strength 5/5 bilaterally. No joint deformity or effusion appreciated. Neurological:  CN 2-12 grossly in tact. No meningismus.  Psychiatric:  Pleasant mood. Patient unable to tell me the year, month, holiday, or president.    Recent Labs Lab 07/16/15 1207  NA 138  K 3.4*  CL 100*  CO2 29  BUN 21*  CREATININE 0.96  GLUCOSE 144*    Recent Labs Lab 07/16/15 1207  HGB 11.6*  HCT 36.2  WBC 4.4  PLT 187   Dg Chest 2 View  07/16/2015  CLINICAL DATA:  Shortness of breath and lethargy starting this morning. Denies pain. Recent diagnosis of pneumonia and is completing antibiotics. History of dementia and COPD. EXAM: CHEST  2 VIEW COMPARISON:  Chest x-ray dated 04/09/2015. FINDINGS: Patchy airspace opacities are seen at the left lung base, likely lingular, compatible with given history of pneumonia. Appearance is similar to the previous chest x-ray of 04/09/2015 but this area appeared clear on the earlier plain film of 01/26/2015. Remainder of the lungs are clear. Cardiomediastinal  silhouette appears stable in size and configuration. Osseous and soft tissue structures about the chest are unremarkable. Mild degenerative change again noted throughout the kyphotic thoracolumbar spine. IMPRESSION: 1. Lingular pneumonia, less likely atelectasis. 2. No other acute/significant findings. Electronically Signed   By: Franki Cabot M.D.   On: 07/16/2015 12:20    ASSESSMENT / PLAN:  81 year old female with known history of dementia and Parkinson's disease presenting with acute hypoxic respiratory failure and left-sided pneumonia in her lingula. I did speak with her husband via phone today to establish the patient's [place]. Patient was started on vancomycin and Zosyn in the emergency department which will be continued. I am evaluating her for possible atypical infections with a respiratory viral panel but given the lobar infiltrate I suspect this is a typical bacterial pneumonia. However, we will need to continue broad-spectrum coverage given her potential exposures. Patient will be continued on her home medications for Parkinson's disease. Given her multiple medical problems and high potential for further clinical decompensation I will admit her to the stepdown unit for close monitoring.  1. Pneumonia in lingula: Blood cultures pending. Continuing vancomycin & Zosyn per pharmacy dosing. Checking Procalcitonin per algorithm. Continuing to trend lactic acid. Checking urine streptococcal and legionella antigens. 2. Acute hypoxic respiratory failure: Secondary to pneumonia. Continuing nasal cannula oxygen to maintain saturation greater than 92%. 3. Parkinson's disease with dementia: Continuing patient on Neupro patch, Rivastigmine patch, Azilect, & Carvidopa-Levodopa. 4. COPD/Bronchiectasis:  No signs of exacerbation. Continuing Pulmicort neb qday. Starting [employer]. 5. Prophylaxis:  Continuing Pepcid po bid in place of Zantac. Heparin [place] q12hr & SCDs. 6. Diet:  NPO except for meds with sips  of clears. 7. Code Status:  Patient is DNR/DNI per my discussion with her husband via phone today.  Sonia Baller Ashok Cordia, M.D. [facility] Pager:  [phone] After 3pm or if no response, call [phone] 07/16/2015, 3:30 PM

## 2015-07-16 NOTE — ED Notes (Signed)
Patient transported to X-ray 

## 2015-07-16 NOTE — ED Notes (Signed)
Delay taking patient to 2W due to [facility], RN was receiving 2 patients back to back in addition to they had patients on the floor requiring immediate attention.

## 2015-07-16 NOTE — ED Provider Notes (Signed)
CSN: [id number]     Arrival date & time 07/16/15  1110 History   First MD Initiated Contact with Patient 07/16/15 1245     Chief Complaint  Patient presents with  . Shortness of Breath  . Fatigue    Low 5 caveat due to altered mental status.  Patient is a 81 y.o. female presenting with shortness of breath. The history is provided by the patient and a relative.  Shortness of Breath  patient presents with shortness of breath. History of COPD/bronchiectasis. Has had a cough. Apparently had low oxygen saturations at nursing home. Has had recent treatment for pneumonia.  Past Medical History  Diagnosis Date  . Colon cancer ([place])   . Parkinson's disease ([place])   . Osteoporosis   . Vitamin D deficiency   . Dementia   . Ventral hernia   . Pernicious anemia   . COPD (chronic obstructive pulmonary disease) ([place])   . Pagets disease, breast [facility])    Past Surgical History  Procedure Laterality Date  . Colon surgery      bowel resection  . Breast surgery Left    Family History  Problem Relation Age of Onset  . Heart disease Mother   . Alzheimer's disease Father    Social History  Substance Use Topics  . Smoking status: Never Smoker   . Smokeless tobacco: Never Used  . Alcohol Use: Yes     Comment: glass of wine maybe once a week   OB History    No data available     Review of Systems  Respiratory: Positive for shortness of breath.       Allergies  Zithromax; Iodine; Ivp dye; and Shellfish allergy  Home Medications   Prior to Admission medications   Medication Sig Start Date End Date Taking? Authorizing Provider  albuterol (PROVENTIL) (5 MG/ML) 0.5% nebulizer solution Take 0.5 mLs (2.5 mg total) by nebulization 2 (two) times daily. 01/28/15  Yes Elsie Stain, MD  budesonide (PULMICORT) 0.25 MG/2ML nebulizer solution Take 0.25 mg by nebulization every morning.   Yes Historical Provider, MD  Calcium-Vitamin D (CALTRATE 600 PLUS-VIT D PO) Take 1 tablet by mouth 2 (two)  times daily.   Yes Historical Provider, MD  Carbidopa-Levodopa ER (RYTARY) 48.75-195 MG CPCR Take 1 tablet by mouth 3 (three) times daily.   Yes Historical Provider, MD  cholecalciferol (VITAMIN D) 1000 UNITS tablet Take 3,000 Units by mouth daily.    Yes Historical Provider, MD  cyanocobalamin (,VITAMIN B-12,) 1000 MCG/ML injection every 30 (thirty) days.   Yes Historical Provider, MD  denosumab (PROLIA) 60 MG/ML SOLN injection Inject 60 mg into the skin every 6 (six) months. Administer in upper arm, thigh, or abdomen   Yes Historical Provider, MD  ENSURE PLUS (ENSURE PLUS) LIQD Take 237 mLs by mouth daily.   Yes Historical Provider, MD  fluticasone (FLONASE) 50 MCG/ACT nasal spray Place 1 spray into both nostrils daily.   Yes Historical Provider, MD  GuaiFENesin (DIABETIC TUSSIN EX PO) Take 5 mLs by mouth every 6 (six) hours as needed (congestion/cough.).   Yes Historical Provider, MD  ibuprofen (ADVIL,MOTRIN) 400 MG tablet Take 400 mg by mouth every 6 (six) hours as needed for mild pain or moderate pain.    Yes Historical Provider, MD  levofloxacin (LEVAQUIN) 500 MG tablet Take 500 mg by mouth daily.   Yes Historical Provider, MD  levothyroxine (SYNTHROID, LEVOTHROID) 25 MCG tablet Take 25 mcg by mouth daily before breakfast.   Yes Historical  Provider, MD  loperamide (IMODIUM) 2 MG capsule Take 2 mg by mouth daily after breakfast.   Yes Historical Provider, MD  Probiotic Product (RISAQUAD PO) Take 1 capsule by mouth daily.   Yes Historical Provider, MD  ranitidine (ZANTAC) 75 MG tablet Take 75 mg by mouth 2 (two) times daily.   Yes Historical Provider, MD  rasagiline (AZILECT) 1 MG TABS tablet Take 1 mg by mouth daily. 1400.   Yes Historical Provider, MD  Rivastigmine (EXELON) 13.3 MG/24HR PT24 Place 1 patch onto the skin daily.   Yes Historical Provider, MD  rotigotine (NEUPRO) 4 MG/24HR Place 1 patch onto the skin daily.   Yes Historical Provider, MD  Wheat Dextrin (BENEFIBER PO) Take 1 scoop  by mouth daily.   Yes Historical Provider, MD  Respiratory Therapy Supplies (FLUTTER) DEVI Use as directed 04/29/15   Tanda Rockers, MD  sulfamethoxazole-trimethoprim (BACTRIM,SEPTRA) 200-40 MG/5ML suspension Take 20 mLs by mouth 2 (two) times daily. 05/10/15   Tanda Rockers, MD   BP 86/48 mmHg  Pulse 94  Temp(Src) 99.5 F (37.5 C) (Rectal)  Resp 16  SpO2 96% Physical Exam  Constitutional: She appears well-developed.  HENT:  Head: Atraumatic.  Neck: Neck supple.  Cardiovascular:  Mild tachycardia  Pulmonary/Chest:  Tachypnea. Harsh breath sounds mid left lung fields.  Abdominal: Soft. There is no tenderness.  Musculoskeletal: She exhibits no edema.  Neurological:  Awake and will attempt to speak but somewhat difficult to understand.  Skin: Skin is warm.    ED Course  Procedures (including critical care time) Labs Review Labs Reviewed  BASIC METABOLIC PANEL - Abnormal; Notable for the following:    Potassium 3.4 (*)    Chloride 100 (*)    Glucose, Bld 144 (*)    BUN 21 (*)    Calcium 8.8 (*)    GFR calc non Af Amer 51 (*)    GFR calc Af Amer 59 (*)    All other components within normal limits  CBC - Abnormal; Notable for the following:    RBC 3.74 (*)    Hemoglobin 11.6 (*)    RDW 15.6 (*)    All other components within normal limits  BLOOD GAS, ARTERIAL - Abnormal; Notable for the following:    pO2, Arterial 75.7 (*)    Bicarbonate 27.0 (*)    Acid-Base Excess 2.2 (*)    All other components within normal limits  I-STAT CG4 LACTIC ACID, ED - Abnormal; Notable for the following:    Lactic Acid, Venous 2.95 (*)    All other components within normal limits  CULTURE, BLOOD (ROUTINE X 2)  CULTURE, BLOOD (ROUTINE X 2)  RESPIRATORY VIRUS PANEL  URINALYSIS, ROUTINE W REFLEX MICROSCOPIC (NOT AT [facility])  PROCALCITONIN  TROPONIN I  TROPONIN I  TROPONIN I  LEGIONELLA ANTIGEN, URINE  STREP PNEUMONIAE URINARY ANTIGEN  LACTIC ACID, PLASMA  LACTIC ACID, PLASMA     Imaging Review Dg Chest 2 View  07/16/2015  CLINICAL DATA:  Shortness of breath and lethargy starting this morning. Denies pain. Recent diagnosis of pneumonia and is completing antibiotics. History of dementia and COPD. EXAM: CHEST  2 VIEW COMPARISON:  Chest x-ray dated 04/09/2015. FINDINGS: Patchy airspace opacities are seen at the left lung base, likely lingular, compatible with given history of pneumonia. Appearance is similar to the previous chest x-ray of 04/09/2015 but this area appeared clear on the earlier plain film of 01/26/2015. Remainder of the lungs are clear. Cardiomediastinal silhouette appears stable in  size and configuration. Osseous and soft tissue structures about the chest are unremarkable. Mild degenerative change again noted throughout the kyphotic thoracolumbar spine. IMPRESSION: 1. Lingular pneumonia, less likely atelectasis. 2. No other acute/significant findings. Electronically Signed   By: Franki Cabot M.D.   On: 07/16/2015 12:20   I have personally reviewed and evaluated these images and lab results as part of my medical decision-making.   EKG Interpretation None      MDM   Final diagnoses:  Dyspnea  Obstructive bronchiectasis ([place])  HCAP (healthcare-associated pneumonia)    Patient was shortness of breath and cough. hcap on xray history of bronchiectasis.  To be seen by pulmonary. Overall poor prognosis.     Davonna Belling, MD 07/16/15 [phone]

## 2015-07-16 NOTE — Progress Notes (Signed)
Code Discussion Note  I spoke with the patient's husband again and he now reports, after talking with his children, that he wants her to be a limited resuscitation. Given her osteoporosis he does not wish her to undergo CPR with a potential for rib fractures. He is okay with her undergoing a short term intubation and other resuscitation including defibrillation and medications.  Sonia Baller Ashok Cordia, M.D. [place] [facility] Pager:  [phone] After 3pm or if no response, call [phone]

## 2015-07-16 NOTE — ED Notes (Signed)
Pt's family reports that Pt recently finished antibiotics for PNA.  Sts her husband has been sick w/ a cold.  Sts Pt has been more lethargic than normal.

## 2015-07-16 NOTE — ED Notes (Signed)
Called floor and Colletta [place] stated I could not bring patient up or give report until she gets RES A situated.

## 2015-07-16 NOTE — ED Notes (Signed)
Pulmonary MD at bedside.

## 2015-07-16 NOTE — Progress Notes (Signed)
Utilization Review completed.  Maiyah Goyne RN CM  

## 2015-07-17 ENCOUNTER — Encounter (HOSPITAL_COMMUNITY): Payer: Self-pay

## 2015-07-17 DIAGNOSIS — J9621 Acute and chronic respiratory failure with hypoxia: Secondary | ICD-10-CM

## 2015-07-17 DIAGNOSIS — E872 Acidosis: Secondary | ICD-10-CM

## 2015-07-17 DIAGNOSIS — F0390 Unspecified dementia without behavioral disturbance: Secondary | ICD-10-CM

## 2015-07-17 LAB — CBC WITH DIFFERENTIAL/PLATELET
Basophils Absolute: 0 10*3/uL (ref 0.0–0.1)
Basophils Relative: 0 %
EOS ABS: 0 10*3/uL (ref 0.0–0.7)
EOS PCT: 0 %
HCT: 30.2 % — ABNORMAL LOW (ref 36.0–46.0)
Hemoglobin: 9.9 g/dL — ABNORMAL LOW (ref 12.0–15.0)
LYMPHS ABS: 0.8 10*3/uL (ref 0.7–4.0)
LYMPHS PCT: 17 %
MCH: 31.5 pg (ref 26.0–34.0)
MCHC: 32.8 g/dL (ref 30.0–36.0)
MCV: 96.2 fL (ref 78.0–100.0)
MONO ABS: 0.7 10*3/uL (ref 0.1–1.0)
Monocytes Relative: 15 %
Neutro Abs: 3 10*3/uL (ref 1.7–7.7)
Neutrophils Relative %: 68 %
PLATELETS: 179 10*3/uL (ref 150–400)
RBC: 3.14 MIL/uL — AB (ref 3.87–5.11)
RDW: 15.7 % — AB (ref 11.5–15.5)
WBC: 4.5 10*3/uL (ref 4.0–10.5)

## 2015-07-17 LAB — TROPONIN I: TROPONIN I: 0.03 ng/mL (ref <0.031)

## 2015-07-17 LAB — RENAL FUNCTION PANEL
ALBUMIN: 2.8 g/dL — AB (ref 3.5–5.0)
Anion gap: 7 (ref 5–15)
BUN: 23 mg/dL — AB (ref 6–20)
CO2: 29 mmol/L (ref 22–32)
CREATININE: 1.04 mg/dL — AB (ref 0.44–1.00)
Calcium: 8.2 mg/dL — ABNORMAL LOW (ref 8.9–10.3)
Chloride: 102 mmol/L (ref 101–111)
GFR calc Af Amer: 54 mL/min — ABNORMAL LOW (ref >60)
GFR, EST NON AFRICAN AMERICAN: 46 mL/min — AB (ref >60)
GLUCOSE: 83 mg/dL (ref 65–99)
PHOSPHORUS: 3.8 mg/dL (ref 2.5–4.6)
Potassium: 3.4 mmol/L — ABNORMAL LOW (ref 3.5–5.1)
Sodium: 138 mmol/L (ref 135–145)

## 2015-07-17 LAB — LACTIC ACID, PLASMA
LACTIC ACID, VENOUS: 1 mmol/L (ref 0.5–2.0)
Lactic Acid, Venous: 0.9 mmol/L (ref 0.5–2.0)
Lactic Acid, Venous: 1 mmol/L (ref 0.5–2.0)

## 2015-07-17 LAB — MAGNESIUM: MAGNESIUM: 1.7 mg/dL (ref 1.7–2.4)

## 2015-07-17 MED ORDER — POTASSIUM CHLORIDE CRYS ER 20 MEQ PO TBCR
20.0000 meq | EXTENDED_RELEASE_TABLET | Freq: Two times a day (BID) | ORAL | Status: AC
  Administered 2015-07-17 (×2): 20 meq via ORAL
  Filled 2015-07-17 (×2): qty 1

## 2015-07-17 NOTE — Progress Notes (Signed)
Received patient from ICU being admitted for HCAP. Pt awake and alert but confused. Pt placed on tele with second confirmation. 02 4L via [place] with bed alarm on. Family at bedside.

## 2015-07-17 NOTE — Progress Notes (Signed)
Pt is incontinent has 2 outstanding urine labs.  Do you want [place] to do an I and O cath to obtain?

## 2015-07-17 NOTE — Progress Notes (Signed)
Name: Shannon Dunn MRN: [id number] DOB: September 01, 1923    ADMISSION DATE:  07/16/2015  REFERRING MD :  Davonna Belling, M.D. / EDP  CHIEF COMPLAINT:  Shortness of Breath  BRIEF PATIENT DESCRIPTION: 81 year old female who resides in SNF. Previous patient of PW with bronchiectasis.   SIGNIFICANT EVENTS  12/30 - Admit  STUDIES:  PFT 03/24/13:  FVC 1.01 L (57%) FEV1 0.58 L (46%) FEV1/FVC 0.57 FEF 25-75 0.32 L (34%) CXR PA/LAT 07/16/15:  Opacity in lingula. No pleural effusion apprecaiated.  MICROBIOLOGY: Blood Ctx x2 12/30>>>  ANTIBIOTICS: Zosyn 12/30>>> Vancomycin 12/30>>>  LINES/TUBES: PIV x1  SUBJECTIVE: No acute events overnight. Patient's blood pressure is stabilized. Patient denies any difficulty breathing or pain at this time.  REVIEW OF SYSTEMS:  Unable to obtain an accurate review of systems given dementia.   VITAL SIGNS: Temp:  [97.7 F (36.5 C)-100.1 F (37.8 C)] 98.5 F (36.9 C) (12/31 0800) Pulse Rate:  [84-109] 93 (12/31 0600) Resp:  [16-21] 18 (12/31 0600) BP: (86-167)/(39-71) 131/53 mmHg (12/31 0500) SpO2:  [82 %-100 %] 96 % (12/31 0845) Weight:  [84 lb 14 oz (38.5 kg)] 84 lb 14 oz (38.5 kg) (12/30 1845)  PHYSICAL EXAMINATION: General:  Comfortable. Sitting up in bed on room air. No distress. Integument:  Warm & dry. No rash on exposed skin.  HEENT:  No oral ulcers. No scleral injection or icterus.  Cardiovascular:  Regular rate. No edema. No appreciable JVD.  Pulmonary:  Coarse breath sounds bilaterally unchanged. Normal work of breathing on room air. Abdomen: Soft. Normal bowel sounds. Nondistended. Grossly nontender.    Recent Labs Lab 07/16/15 1207 07/17/15 0350  NA 138 138  K 3.4* 3.4*  CL 100* 102  CO2 29 29  BUN 21* 23*  CREATININE 0.96 1.04*  GLUCOSE 144* 83    Recent Labs Lab 07/16/15 1207 07/17/15 0350  HGB 11.6* 9.9*  HCT 36.2 30.2*  WBC 4.4 4.5  PLT 187 179   Dg Chest 2 View  07/16/2015  CLINICAL DATA:  Shortness of  breath and lethargy starting this morning. Denies pain. Recent diagnosis of pneumonia and is completing antibiotics. History of dementia and COPD. EXAM: CHEST  2 VIEW COMPARISON:  Chest x-ray dated 04/09/2015. FINDINGS: Patchy airspace opacities are seen at the left lung base, likely lingular, compatible with given history of pneumonia. Appearance is similar to the previous chest x-ray of 04/09/2015 but this area appeared clear on the earlier plain film of 01/26/2015. Remainder of the lungs are clear. Cardiomediastinal silhouette appears stable in size and configuration. Osseous and soft tissue structures about the chest are unremarkable. Mild degenerative change again noted throughout the kyphotic thoracolumbar spine. IMPRESSION: 1. Lingular pneumonia, less likely atelectasis. 2. No other acute/significant findings. Electronically Signed   By: Franki Cabot M.D.   On: 07/16/2015 12:20    ASSESSMENT / PLAN:  81 year old female with known history of dementia and Parkinson's disease presenting with acute hypoxic respiratory failure and left-sided pneumonia in her lingula. Patient's borderline hypertension improved with fluid resuscitation. Patient desiring to increase fluid intake. She continues to have mild hypoxic respiratory failure from her pneumonia. Continues to demonstrate no signs of decompensation from her underlying COPD/bronchiectasis. Lactic acidosis persists but given her continued hemodynamic stability I will transition the patient out of stepdown today.   1. Pneumonia in Lingula: Blood cultures, Respiratory Viral Panel, Urine Legionella Antigen, & Urine Strep Antigen pending. Continuing Vancomycin & Zosyn Day #2. Procalcitonin per algorithm.  2. Acute  hypoxic respiratory failure: Secondary to pneumonia. Continuing nasal cannula oxygen to maintain saturation greater than 92%. 3. Parkinson's disease with dementia: Continuing patient on Neupro patch, Rivastigmine patch, Azilect, &  Carvidopa-Levodopa. 4. Lactic Acidosis:  Continuing to trend lactic acid q6hr x3. 5. Anemia:  No signs of active bleeding. Continuing to trend Hgb daily with CBC. 6. Hypokalemia:  Replacing PO w/ 40 mEq. Repeat electrolyte panel in the morning. 7. COPD/Bronchiectasis:  No signs of exacerbation. Continuing Pulmicort neb qdaycontinuingneb q6hr. 8. Prophylaxis:  Continuing Pepcid po bid in place of Zantac. Heparin  q12hr & SCDs. 9. Diet:  Starting clear liquid diet and advance as tolerated. 10. Disposition:  Transferring patient to Telemetry Floor Bed. Plan to leave on PCCM service.  Sonia Baller Ashok Cordia, M.D. [facility]monary [facility] Critical [facility] Pager:  [phone] After 3pm or if no response, call [phone] 07/17/2015, 10:18 AM

## 2015-07-18 DIAGNOSIS — J9601 Acute respiratory failure with hypoxia: Secondary | ICD-10-CM | POA: Diagnosis present

## 2015-07-18 LAB — CBC WITH DIFFERENTIAL/PLATELET
Basophils Absolute: 0 10*3/uL (ref 0.0–0.1)
Basophils Relative: 0 %
EOS ABS: 0 10*3/uL (ref 0.0–0.7)
Eosinophils Relative: 0 %
HCT: 32.8 % — ABNORMAL LOW (ref 36.0–46.0)
HEMOGLOBIN: 10.6 g/dL — AB (ref 12.0–15.0)
LYMPHS ABS: 0.7 10*3/uL (ref 0.7–4.0)
LYMPHS PCT: 29 %
MCH: 31.5 pg (ref 26.0–34.0)
MCHC: 32.3 g/dL (ref 30.0–36.0)
MCV: 97.6 fL (ref 78.0–100.0)
Monocytes Absolute: 0.3 10*3/uL (ref 0.1–1.0)
Monocytes Relative: 14 %
NEUTROS ABS: 1.3 10*3/uL — AB (ref 1.7–7.7)
NEUTROS PCT: 57 %
Platelets: 180 10*3/uL (ref 150–400)
RBC: 3.36 MIL/uL — AB (ref 3.87–5.11)
RDW: 15.6 % — ABNORMAL HIGH (ref 11.5–15.5)
WBC: 2.4 10*3/uL — AB (ref 4.0–10.5)

## 2015-07-18 LAB — RENAL FUNCTION PANEL
ANION GAP: 6 (ref 5–15)
Albumin: 2.8 g/dL — ABNORMAL LOW (ref 3.5–5.0)
BUN: 20 mg/dL (ref 6–20)
CALCIUM: 8.3 mg/dL — AB (ref 8.9–10.3)
CHLORIDE: 104 mmol/L (ref 101–111)
CO2: 27 mmol/L (ref 22–32)
Creatinine, Ser: 0.96 mg/dL (ref 0.44–1.00)
GFR calc non Af Amer: 51 mL/min — ABNORMAL LOW (ref >60)
GFR, EST AFRICAN AMERICAN: 59 mL/min — AB (ref >60)
Glucose, Bld: 78 mg/dL (ref 65–99)
POTASSIUM: 3.7 mmol/L (ref 3.5–5.1)
Phosphorus: 2.9 mg/dL (ref 2.5–4.6)
Sodium: 137 mmol/L (ref 135–145)

## 2015-07-18 LAB — MAGNESIUM: Magnesium: 1.7 mg/dL (ref 1.7–2.4)

## 2015-07-18 LAB — PROCALCITONIN: Procalcitonin: 0.32 ng/mL

## 2015-07-18 MED ORDER — PIPERACILLIN-TAZOBACTAM 3.375 G IVPB
3.3750 g | Freq: Four times a day (QID) | INTRAVENOUS | Status: DC
  Filled 2015-07-18: qty 50

## 2015-07-18 MED ORDER — FAMOTIDINE 10 MG PO TABS
10.0000 mg | ORAL_TABLET | Freq: Two times a day (BID) | ORAL | Status: DC
  Administered 2015-07-18 – 2015-07-20 (×5): 10 mg via ORAL
  Filled 2015-07-18 (×7): qty 1

## 2015-07-18 MED ORDER — PIPERACILLIN-TAZOBACTAM 3.375 G IVPB
3.3750 g | Freq: Three times a day (TID) | INTRAVENOUS | Status: DC
  Administered 2015-07-18 – 2015-07-19 (×2): 3.375 g via INTRAVENOUS
  Filled 2015-07-18 (×4): qty 50

## 2015-07-18 NOTE — Progress Notes (Signed)
Pt sats this AM were down to low 80's, after repostioning her, having her do pulm toilet still in mid 80's.  RT called and placed on 45%FM sats now 90's.  RT will be in at 0800 and start to weaning back to 4l'm [place]

## 2015-07-18 NOTE — Plan of Care (Signed)
Problem: Education: Goal: Knowledge of [facility] Education information/materials will improve Outcome: Not Progressing Pt has dementia and needs frequent reminders of care and safety

## 2015-07-18 NOTE — Progress Notes (Addendum)
Pharmacy Antibiotic Follow-up Note  Shannon Dunn is a 82 y.o. year-old female admitted on 07/16/2015.  The patient is currently on day 3 of Vancomycin and Zosyn for pneumonia.  Assessment/Plan: Continue Vancomycin 500 mg IV q24h.  Will obtain trough level at steady state if continued. Zosyn 3.375g IV q8h with 4 hour extended infusion is preferred in this patient; however, due to advanced dementia, patient continues to bend arms causing problems with infusion and RN cannot stay at bedside for the 4 hour infusion to correct this.  Per RN request, will change Zosyn to 3.375g IV q6h given over 30 minutes.  ADDENDUM 07/18/2015 2:56 PM RN notified pharmacy that she was able to establish another IV site and patient should now be able to receive the 4 hour extended infusion without issues. Plan: Change Zosyn back to extended infusion dosing.  Zosyn 3.375g IV q8h (4 hour infusion time).    Temp (24hrs), Avg:98.4 F (36.9 C), Min:97.9 F (36.6 C), Max:99.1 F (37.3 C)   Recent Labs Lab 07/16/15 1207 07/17/15 0350 07/18/15 0528  WBC 4.4 4.5 2.4*    Recent Labs Lab 07/16/15 1207 07/17/15 0350 07/18/15 0528  CREATININE 0.96 1.04* 0.96   Estimated Creatinine Clearance: 26.3 mL/min (by C-G formula based on Cr of 0.96).    Allergies  Allergen Reactions  . Zithromax [Azithromycin] Diarrhea  . Iodine Other (See Comments)    Unknown, might be rash/hives  . Ivp Dye [Iodinated Diagnostic Agents] Other (See Comments)    Unknown rash/hives maybe  . Shellfish Allergy Other (See Comments)    Unknown rash/hives, scallops     Antimicrobials this admission: 12/30 vanc >>  12/30 zosyn >>   Levels/dose changes this admission: -  Microbiology Results: 12/30 BCx x2: ngtd 12/30 MRSA screen neg Urinary antigens ordered but not collected  Thank you for allowing pharmacy to be a part of this patient's care.  [name] PharmD 07/18/2015 1:08 PM

## 2015-07-18 NOTE — Progress Notes (Signed)
Pt Adamantly refused breathing treatment.  Pt grabbed Neb face mask & pulled off. RT returned Pt to venti mask, which she tolerated.

## 2015-07-18 NOTE — Progress Notes (Signed)
Name: Shannon Dunn MRN: [id number] DOB: [birth year]    ADMISSION DATE:  07/16/2015  REFERRING MD :  Davonna Belling, M.D. / EDP  CHIEF COMPLAINT:  Shortness of Breath  BRIEF PATIENT DESCRIPTION: 82 yowf who resides in SNF/memory care unit for dementia. Previous patient of PW with bronchiectasis.   SIGNIFICANT EVENTS  12/30 - Admit  STUDIES:  PFT 03/24/13:  FVC 1.01 L (57%) FEV1 0.58 L (46%) FEV1/FVC 0.57 FEF 25-75 0.32 L (34%) CXR PA/LAT 07/16/15:  Opacity in lingula. No pleural effusion apprecaiated.  MICROBIOLOGY: Blood Ctx x2 12/30>>>  ANTIBIOTICS: Zosyn 12/30>>> Vancomycin 12/30>>>  LINES/TUBES: PIV x1  SUBJECTIVE:   nad on floor on 4lpm   REVIEW OF SYSTEMS:  Unable to obtain an accurate review of systems given dementia.   VITAL SIGNS: Temp:  [97.9 F (36.6 C)-99.1 F (37.3 C)] 98.2 F (36.8 C) (01/01 0541) Pulse Rate:  [49-92] 92 (01/01 0541) Resp:  [16-22] 20 (01/01 0541) BP: (111-141)/(57-89) 123/59 mmHg (01/01 0541) SpO2:  [82 %-100 %] 95 % (01/01 0824) FiO2 (%):  [45 %] 45 % (01/01 0824) Weight:  [85 lb 15.7 oz (39 kg)-92 lb 6 oz (41.9 kg)] 92 lb 6 oz (41.9 kg) (01/01 0541)  PHYSICAL EXAMINATION: General:  Comfortable.   No distress. Integument:  Warm & dry. No rash on exposed skin.  HEENT:  No oral ulcers. No scleral injection or icterus.  Cardiovascular:  Regular rate. No edema. No appreciable JVD.  Pulmonary:  Coarse breath sounds bilaterally unchanged.   Abdomen: Soft. Normal bowel sounds. Nondistended. Grossly nontender.    Recent Labs Lab 07/16/15 1207 07/17/15 0350 07/18/15 0528  NA 138 138 137  K 3.4* 3.4* 3.7  CL 100* 102 104  CO2 29 29 27   BUN 21* 23* 20  CREATININE 0.96 1.04* 0.96  GLUCOSE 144* 83 78    Recent Labs Lab 07/16/15 1207 07/17/15 0350 07/18/15 0528  HGB 11.6* 9.9* 10.6*  HCT 36.2 30.2* 32.8*  WBC 4.4 4.5 2.4*  PLT 187 179 180   Dg Chest 2 View  07/16/2015  CLINICAL DATA:  Shortness of breath and  lethargy starting this morning. Denies pain. Recent diagnosis of pneumonia and is completing antibiotics. History of dementia and COPD. EXAM: CHEST  2 VIEW COMPARISON:  Chest x-ray dated 04/09/2015. FINDINGS: Patchy airspace opacities are seen at the left lung base, likely lingular, compatible with given history of pneumonia. Appearance is similar to the previous chest x-ray of 04/09/2015 but this area appeared clear on the earlier plain film of 01/26/2015. Remainder of the lungs are clear. Cardiomediastinal silhouette appears stable in size and configuration. Osseous and soft tissue structures about the chest are unremarkable. Mild degenerative change again noted throughout the kyphotic thoracolumbar spine. IMPRESSION: 1. Lingular pneumonia, less likely atelectasis. 2. No other acute/significant findings. Electronically Signed   By: Franki Cabot M.D.   On: 07/16/2015 12:20    ASSESSMENT / PLAN:  82 year old female with known history of dementia and Parkinson's disease presented with acute hypoxic respiratory failure and left-sided pneumonia in her lingula. Patient's borderline hypertension improved with fluid resuscitation. Patient desiring to increase fluid intake. She continues to have mild hypoxic respiratory failure from her pneumonia. Continues to demonstrate no signs of decompensation from her underlying COPD/bronchiectasis.   1. Pneumonia in Lingula: Blood cultures, Respiratory Viral Panel, Urine Legionella Antigen, & Urine Strep Antigen pending. Continuing Vancomycin & Zosyn Day #3. Procalcitonin per algorithm.  2. Acute hypoxic respiratory failure: Secondary to pneumonia. Continuing nasal  cannula oxygen to maintain saturation greater than 92%. 3. Parkinson's disease with dementia: Continuing patient on Neupro patch, Rivastigmine patch, Azilect, & Carvidopa-Levodopa. 4. Lactic Acidosis:  Clinically resolved  5. Anemia:  No signs of active bleeding. Continuing to trend Hgb daily with  CBC. 6. Hypokalemia:  Resolved 7. COPD/Bronchiectasis:  No signs of exacerbation. Continuing Pulmicort neb qdaycontinuingneb q6hr. 8. Prophylaxis:  Continuing Pepcid po bid in place of Zantac. Heparin [place] q12hr & SCDs. 9. Diet: regular  10. Disposition:  Transferring patient to Telemetry Floor Bed.   Discussed with son at bedside. She has a weak marginally effective cough but tol po's well without signs of asp so main challenge at this point is promoting better cough mechanics/ mobilize to chair  Cultures all still pending > continue rx for HCAP as she is for now    Christinia Gully, MD Pulmonary and [place] [phone] After 5:30 PM or weekends, call [phone]

## 2015-07-19 DIAGNOSIS — R5381 Other malaise: Secondary | ICD-10-CM

## 2015-07-19 DIAGNOSIS — J471 Bronchiectasis with (acute) exacerbation: Secondary | ICD-10-CM

## 2015-07-19 LAB — CBC WITH DIFFERENTIAL/PLATELET
BASOS PCT: 0 %
Basophils Absolute: 0 10*3/uL (ref 0.0–0.1)
EOS ABS: 0 10*3/uL (ref 0.0–0.7)
EOS PCT: 0 %
HCT: 32.1 % — ABNORMAL LOW (ref 36.0–46.0)
Hemoglobin: 10.2 g/dL — ABNORMAL LOW (ref 12.0–15.0)
Lymphocytes Relative: 35 %
Lymphs Abs: 0.9 10*3/uL (ref 0.7–4.0)
MCH: 30.6 pg (ref 26.0–34.0)
MCHC: 31.8 g/dL (ref 30.0–36.0)
MCV: 96.4 fL (ref 78.0–100.0)
MONO ABS: 0.3 10*3/uL (ref 0.1–1.0)
MONOS PCT: 13 %
NEUTROS PCT: 52 %
Neutro Abs: 1.3 10*3/uL — ABNORMAL LOW (ref 1.7–7.7)
PLATELETS: 172 10*3/uL (ref 150–400)
RBC: 3.33 MIL/uL — ABNORMAL LOW (ref 3.87–5.11)
RDW: 15.4 % (ref 11.5–15.5)
WBC: 2.6 10*3/uL — ABNORMAL LOW (ref 4.0–10.5)

## 2015-07-19 LAB — RENAL FUNCTION PANEL
ALBUMIN: 2.7 g/dL — AB (ref 3.5–5.0)
ANION GAP: 8 (ref 5–15)
BUN: 16 mg/dL (ref 6–20)
CALCIUM: 8.3 mg/dL — AB (ref 8.9–10.3)
CO2: 26 mmol/L (ref 22–32)
CREATININE: 1.05 mg/dL — AB (ref 0.44–1.00)
Chloride: 104 mmol/L (ref 101–111)
GFR calc Af Amer: 53 mL/min — ABNORMAL LOW (ref >60)
GFR calc non Af Amer: 46 mL/min — ABNORMAL LOW (ref >60)
GLUCOSE: 77 mg/dL (ref 65–99)
PHOSPHORUS: 2.7 mg/dL (ref 2.5–4.6)
Potassium: 3.5 mmol/L (ref 3.5–5.1)
SODIUM: 138 mmol/L (ref 135–145)

## 2015-07-19 LAB — MAGNESIUM: Magnesium: 1.8 mg/dL (ref 1.7–2.4)

## 2015-07-19 MED ORDER — AMOXICILLIN-POT CLAVULANATE ER 1000-62.5 MG PO TB12
2.0000 | ORAL_TABLET | Freq: Two times a day (BID) | ORAL | Status: DC
Start: 2015-07-19 — End: 2015-07-19

## 2015-07-19 MED ORDER — AMOXICILLIN-POT CLAVULANATE 500-125 MG PO TABS
1.0000 | ORAL_TABLET | Freq: Two times a day (BID) | ORAL | Status: DC
  Administered 2015-07-19 – 2015-07-20 (×3): 500 mg via ORAL
  Filled 2015-07-19 (×5): qty 1

## 2015-07-19 NOTE — Progress Notes (Signed)
   Name: Shannon Dunn MRN: [id number] DOB: [birth year]    ADMISSION DATE:  07/16/2015  REFERRING MD :  Davonna Belling, M.D. / EDP  CHIEF COMPLAINT:  Shortness of Breath  BRIEF PATIENT DESCRIPTION: 64 yowf who resides in SNF/memory care unit for dementia. Previous patient of PW with bronchiectasis.   SIGNIFICANT EVENTS  12/30 - Admit  STUDIES:  PFT 03/24/13:  FVC 1.01 L (57%) FEV1 0.58 L (46%) FEV1/FVC 0.57 FEF 25-75 0.32 L (34%) CXR PA/LAT 07/16/15:  Opacity in lingula. No pleural effusion apprecaiated.  MICROBIOLOGY: Blood Ctx x2 12/30>>>  ANTIBIOTICS: Zosyn 12/30>>> Vancomycin 12/30>>>  LINES/TUBES: PIV x1  SUBJECTIVE:   Feels better Still on oxygen Out of bed some last night Family wants to get her out   REVIEW OF SYSTEMS:  Unable to obtain an accurate review of systems given dementia.   VITAL SIGNS: Temp:  [97.7 F (36.5 C)-98.6 F (37 C)] 97.7 F (36.5 C) (01/02 0620) Pulse Rate:  [84-93] 93 (01/02 0620) Resp:  [18-20] 20 (01/02 1330) BP: (112-138)/(50-95) 136/95 mmHg (01/02 0620) SpO2:  [94 %-100 %] 94 % (01/02 0620) Weight:  [39.2 kg (86 lb 6.7 oz)] 39.2 kg (86 lb 6.7 oz) (01/02 0620)  PHYSICAL EXAMINATION: General:  Comfortable in bed HENT: NCAT EOMi PULM: diminished left some wheezing, normal effort CV: S1/S2 WNL GI: BS+, soft, nontender MSK: diminished bulk/tone in keeping with age Derm: no rash or skin breakdown   Recent Labs Lab 07/17/15 0350 07/18/15 0528 07/19/15 0456  NA 138 137 138  K 3.4* 3.7 3.5  CL 102 104 104  CO2 29 27 26   BUN 23* 20 16  CREATININE 1.04* 0.96 1.05*  GLUCOSE 83 78 77    Recent Labs Lab 07/17/15 0350 07/18/15 0528 07/19/15 0456  HGB 9.9* 10.6* 10.2*  HCT 30.2* 32.8* 32.1*  WBC 4.5 2.4* 2.6*  PLT 179 180 172   No results found.   Admission CXR images personally reviewed showing a lingular infiltrate, cardiomegally  ASSESSMENT / PLAN:   82 y/o female with dementia, HCAP, hypoxemia.  Has baseline  COPD bronchiectasis.  O2 saturation still low compared to baseline but improving overall.   Principal Problem:   HCAP (healthcare-associated pneumonia) Active Problems:   Parkinson's disease ([place])   Obstructive bronchiectasis ([place])   Acute respiratory failure with hypoxia ([place])   Physical deconditioning  Labs better  Stop vanc/zosyn Start augmentin, plan 10 days total Out of bed PT consult Wean O2 Needs Home O2 eval Flutter valve Incentive spirometry Home diaper regiment OK   Family updated bedside, they are anxious to get her out  Roselie Awkward, MD [place] [facility] Pager: [phone] Cell: [phone] After 3pm or if no response, call [phone]

## 2015-07-19 NOTE — Progress Notes (Signed)
Dtr is requesting an order for PT eval and treat.  Pt able to get oob to bsc with 2 max assist, walker and much prompting.  Pt has generalized weakness and is unsteady.  Dtr states pt wnl is walking unassisted and without an aid and has become very deconditioned during this illness this week.  Plan is to get pt oob to chair for every meal and walk in hallway TID.

## 2015-07-20 LAB — CBC WITH DIFFERENTIAL/PLATELET
BASOS ABS: 0 10*3/uL (ref 0.0–0.1)
Basophils Relative: 0 %
Eosinophils Absolute: 0 10*3/uL (ref 0.0–0.7)
Eosinophils Relative: 0 %
HEMATOCRIT: 34.3 % — AB (ref 36.0–46.0)
HEMOGLOBIN: 10.9 g/dL — AB (ref 12.0–15.0)
LYMPHS PCT: 31 %
Lymphs Abs: 0.8 10*3/uL (ref 0.7–4.0)
MCH: 31.4 pg (ref 26.0–34.0)
MCHC: 31.8 g/dL (ref 30.0–36.0)
MCV: 98.8 fL (ref 78.0–100.0)
Monocytes Absolute: 0.2 10*3/uL (ref 0.1–1.0)
Monocytes Relative: 9 %
NEUTROS ABS: 1.4 10*3/uL — AB (ref 1.7–7.7)
Neutrophils Relative %: 60 %
Platelets: 166 10*3/uL (ref 150–400)
RBC: 3.47 MIL/uL — AB (ref 3.87–5.11)
RDW: 15.7 % — ABNORMAL HIGH (ref 11.5–15.5)
WBC: 2.4 10*3/uL — AB (ref 4.0–10.5)

## 2015-07-20 LAB — RENAL FUNCTION PANEL
Albumin: 2.7 g/dL — ABNORMAL LOW (ref 3.5–5.0)
Anion gap: 8 (ref 5–15)
BUN: 11 mg/dL (ref 6–20)
CHLORIDE: 105 mmol/L (ref 101–111)
CO2: 27 mmol/L (ref 22–32)
CREATININE: 0.9 mg/dL (ref 0.44–1.00)
Calcium: 8.1 mg/dL — ABNORMAL LOW (ref 8.9–10.3)
GFR calc non Af Amer: 55 mL/min — ABNORMAL LOW (ref >60)
Glucose, Bld: 90 mg/dL (ref 65–99)
POTASSIUM: 3.3 mmol/L — AB (ref 3.5–5.1)
Phosphorus: 2.4 mg/dL — ABNORMAL LOW (ref 2.5–4.6)
Sodium: 140 mmol/L (ref 135–145)

## 2015-07-20 LAB — MAGNESIUM: MAGNESIUM: 1.6 mg/dL — AB (ref 1.7–2.4)

## 2015-07-20 LAB — RESPIRATORY VIRUS PANEL
Adenovirus: NEGATIVE
INFLUENZA A: POSITIVE — AB
Influenza B: NEGATIVE
Metapneumovirus: NEGATIVE
PARAINFLUENZA 1 A: NEGATIVE
PARAINFLUENZA 2 A: NEGATIVE
PARAINFLUENZA 3 A: NEGATIVE
RESPIRATORY SYNCYTIAL VIRUS B: NEGATIVE
RHINOVIRUS: NEGATIVE
Respiratory Syncytial Virus A: NEGATIVE

## 2015-07-20 LAB — PROCALCITONIN: PROCALCITONIN: 0.2 ng/mL

## 2015-07-20 MED ORDER — RISAQUAD PO CAPS: 1.0000 | ORAL_CAPSULE | Freq: Every day | ORAL | Status: DC

## 2015-07-20 MED ORDER — AMOXICILLIN-POT CLAVULANATE 500-125 MG PO TABS: 1.0000 | ORAL_TABLET | Freq: Two times a day (BID) | ORAL | Status: DC

## 2015-07-20 MED ORDER — IPRATROPIUM-ALBUTEROL 0.5-2.5 (3) MG/3ML IN SOLN: 3.0000 mL | Freq: Three times a day (TID) | RESPIRATORY_TRACT | Status: DC

## 2015-07-20 MED ORDER — IPRATROPIUM-ALBUTEROL 0.5-2.5 (3) MG/3ML IN SOLN
3.0000 mL | Freq: Four times a day (QID) | RESPIRATORY_TRACT | Status: DC
  Administered 2015-07-20: 3 mL via RESPIRATORY_TRACT
  Filled 2015-07-20 (×2): qty 3

## 2015-07-20 MED ORDER — FLUTTER DEVI: Status: DC

## 2015-07-20 NOTE — Progress Notes (Addendum)
[facility] is providing the following services: Oxygen   [facility] has been notified that patient will need oxygen upon discharge.  Awaiting oxygen order signed by NP for compliance.  CM notified that order is still holding.  Also, patient's family will be asked to sign a Waiver of Liability due to incomplete O2 sats.  Family will be responsible for payment in the event that [facility] Medicare denies claim.    If patient discharges after hours, please call [phone].   Linward Headland 07/20/2015, 2:52 PM

## 2015-07-20 NOTE — Progress Notes (Signed)
Patient has been discharged, PTAR called, report called to [facility]. Resting comfortably, denies any needs. Will continue to monitor.

## 2015-07-20 NOTE — Discharge Summary (Signed)
Physician Discharge Summary       Patient ID: Shannon Dunn MRN: [id number] DOB/AGE: 03-16-1924 82 y.o.  Admit date: 07/16/2015 Discharge date: 07/20/2015  Discharge Diagnoses:  HCAP (healthcare-associated pneumonia) Parkinson's disease ([place]) Dementia  Obstructive bronchiectasis ([place]) Acute respiratory failure with hypoxia [facility]) Physical deconditioning Detailed Hospital Course:  Patient presented from her skilled nursing facility with shortness of breath and known history of COPD/bronchiectasis. Reportedly the patient has had a cough, lethargy as well as low oxygen saturations on day of admit. On presentation room air sats in 70s. Found by daughter. Also noted the patient has had recent prior treatment for pneumonia. There was report of a respiratory illness starting approximately 1 week prior to admit and had been treated with amoxicillin. In the ER was found to have Left lingular airspace disease and was  started on broad-spectrum antibiotics with vancomycin and Zosyn to cover for healthcare associated organisms. Additionally treated supportively with nebulized bronchodilators, oxygen and pulmonary hygiene measures. She continued to make slow and steady improvements. Her antibiotics were changed to oral route on 1/2. We felt that she was ready for discharge as of 1/3 as we were treating her with oral antibiotics and that from an dementia stand-point she would be better served out of the hospital in a familiar environment. Her daughter arranged home sitter. We had PT eval and set up for home PT assist. Also assessed room air oxygen saturations as well as O2 needs which were: SaO2 84% on RA at rest, 87-94% on 4L O2 [place] with walking. She is now ready for d/c with the following plan of care as listed below.      Discharge Plan by active problems  1) HCAP (healthcare-associated pneumonia) Plan Complete 6 more days of Augmentin Add pro-biotic per daughter's request   2) Bronchiectasis w/  mucous plugging Plan Resume home BDs Ensure flutter valve at d/c  3) Acute hypoxic respiratory failure d/t 1 & 2. Improving.  Plan Home on 4 liters of oxygen F/u in our office after completion of abx and to assess further O2 requirements.   4) Parkinson's disease ([place]) Plan Resume home rx   5) Physical deconditioning Plan Home w/ PT and family has arranged sitter.      Significant Hospital tests/ studies  Consults: physical therapy.   Discharge Exam: BP 166/60 mmHg  Pulse 92  Temp(Src) 97.8 F (36.6 C) (Oral)  Resp 18  Ht 4\' 11"  (1.499 m)  Wt 41.4 kg (91 lb 4.3 oz)  BMI 18.42 kg/m2  SpO2 84%  General: Comfortable in bed HENT: NCAT EOMiHe denies any other known history. The patient herself denies any pain or difficulty breathing. She also denies any nausea at present. I PULM: diminished, crackles bases.  CV: S1/S2 WNL GI: BS+, soft, nontender MSK: diminished bulk/tone in keeping with age Derm: no rash or skin breakdown  Labs at discharge Lab Results  Component Value Date   CREATININE 0.90 07/20/2015   BUN 11 07/20/2015   NA 140 07/20/2015   K 3.3* 07/20/2015   CL 105 07/20/2015   CO2 27 07/20/2015   Lab Results  Component Value Date   WBC 2.4* 07/20/2015   HGB 10.9* 07/20/2015   HCT 34.3* 07/20/2015   MCV 98.8 07/20/2015   PLT 166 07/20/2015   Lab Results  Component Value Date   ALT 5* 04/09/2015   AST 16 04/09/2015   ALKPHOS 50 04/09/2015   BILITOT 0.3 04/09/2015   No results found for: INR, PROTIME  Current radiology studies No results found.  Disposition:  01-Home or Self Care      Discharge Instructions    Diet - low sodium heart healthy    Complete by:  As directed      For home use only DME oxygen    Complete by:  As directed   Mode or (Route):  Nasal cannula  Liters per Minute:  4  Oxygen delivery system:  Gas     Increase activity slowly    Complete by:  As directed             Medication List    STOP taking these  medications        levofloxacin 500 MG tablet  Commonly known as:  LEVAQUIN     sulfamethoxazole-trimethoprim 200-40 MG/5ML suspension  Commonly known as:  BACTRIM,SEPTRA      TAKE these medications        acidophilus Caps capsule  Take 1 capsule by mouth daily.     albuterol (5 MG/ML) 0.5% nebulizer solution  Commonly known as:  PROVENTIL  Take 0.5 mLs (2.5 mg total) by nebulization 2 (two) times daily.     amoxicillin-clavulanate 500-125 MG tablet  Commonly known as:  AUGMENTIN  Take 1 tablet (500 mg total) by mouth every 12 (twelve) hours.     AZILECT 1 MG Tabs tablet  Generic drug:  rasagiline  Take 1 mg by mouth daily. 1400.     BENEFIBER PO  Take 1 scoop by mouth daily.     budesonide 0.25 MG/2ML nebulizer solution  Commonly known as:  PULMICORT  Take 0.25 mg by nebulization every morning.     CALTRATE 600 PLUS-VIT D PO  Take 1 tablet by mouth 2 (two) times daily.     cholecalciferol 1000 units tablet  Commonly known as:  VITAMIN D  Take 3,000 Units by mouth daily.     cyanocobalamin 1000 MCG/ML injection  Commonly known as:  (VITAMIN B-12)  every 30 (thirty) days.     denosumab 60 MG/ML Soln injection  Commonly known as:  PROLIA  Inject 60 mg into the skin every 6 (six) months. Administer in upper arm, thigh, or abdomen     DIABETIC TUSSIN EX PO  Take 5 mLs by mouth every 6 (six) hours as needed (congestion/cough.).     ENSURE PLUS Liqd  Take 237 mLs by mouth daily.     EXELON 13.3 MG/24HR Pt24  Generic drug:  Rivastigmine  Place 1 patch onto the skin daily.     fluticasone 50 MCG/ACT nasal spray  Commonly known as:  FLONASE  Place 1 spray into both nostrils daily.     FLUTTER Devi  Use at least 4 times a day     ibuprofen 400 MG tablet  Commonly known as:  ADVIL,MOTRIN  Take 400 mg by mouth every 6 (six) hours as needed for mild pain or moderate pain.     levothyroxine 25 MCG tablet  Commonly known as:  SYNTHROID, LEVOTHROID  Take 25 mcg  by mouth daily before breakfast.     loperamide 2 MG capsule  Commonly known as:  IMODIUM  Take 2 mg by mouth daily after breakfast.     NEUPRO 4 MG/24HR  Generic drug:  rotigotine  Place 1 patch onto the skin daily.     ranitidine 75 MG tablet  Commonly known as:  ZANTAC  Take 75 mg by mouth 2 (two) times daily.     RYTARY [id number]  MG Cpcr  Generic drug:  Carbidopa-Levodopa ER  Take 1 tablet by mouth 3 (three) times daily.       Follow-up Information    Follow up with Magdalen Spatz, NP On 07/30/2015.   Specialty:  Pulmonary Disease   Why:  10am    Contact information:   [address] [place] [address] [phone]       Discharged Condition: good  Physician Statement:   The Patient was personally examined, the discharge assessment and plan has been personally reviewed and I agree with ACNP Carliss Porcaro's assessment and plan. > 30 minutes of time have been dedicated to discharge assessment, planning and discharge instructions.   Signed: Clementeen Graham 07/20/2015, 11:59 AM

## 2015-07-20 NOTE — Progress Notes (Signed)
[facility] is providing the following services: Received updated order and oxygen sats - order will be processed and patient's family will be called to setup delivery time.   If patient discharges after hours, please call [phone].   Linward Headland 07/20/2015, 4:06 PM

## 2015-07-20 NOTE — NC FL2 (Signed)
[place] LEVEL OF CARE SCREENING TOOL     IDENTIFICATION  Patient Name: Shannon Dunn Birthdate: 1923/12/23 Sex: female Admission Date (Current Location): 07/16/2015  [facility] and [place] Number:  Herbalist and Address:  [facility],  [place] [address], [place]      Provider Number: [id number]  Attending Physician Name and Address:  Javier Glazier, MD  Relative Name and Phone Number:       Current Level of Care: Hospital Recommended Level of Care: [place], Memory Care Prior Approval Number:    Date Approved/Denied:   PASRR Number:    Discharge Plan: Other (Comment) (ALF/Memory Care Unit)    Current Diagnoses: Patient Active Problem List   Diagnosis Date Noted  . Physical deconditioning 07/19/2015  . Acute respiratory failure with hypoxia ([place]) 07/18/2015  . HCAP (healthcare-associated pneumonia) 07/16/2015  . Obstructive bronchiectasis ([place]) 04/29/2015  . H/O malignant neoplasm of skin 12/24/2014  . Cancer ([place]) 04/16/2014  . Mammary Pagets disease ([place]) 04/16/2014  . Cancer of nipple and areola of female breast ([place]) 04/16/2014  . Bronchiectasis without acute exacerbation ([place]) 03/24/2013  . Pernicious anemia   . Dementia   . Vitamin D deficiency   . Osteoporosis   . Parkinson's disease ([place])   . Hernia of anterior abdominal wall 03/02/2013  . H/O nutritional disorder 01/15/2013  . Personal history of nutritional deficiency 01/15/2013    Orientation RESPIRATION BLADDER Height & Weight    Self  O2 (4L) Incontinent 4\' 11"  (149.9 cm) 91 lbs.  BEHAVIORAL SYMPTOMS/MOOD NEUROLOGICAL BOWEL NUTRITION STATUS      Incontinent Diet (regular)  AMBULATORY STATUS COMMUNICATION OF NEEDS Skin   Limited Assist Verbally Normal                       Personal Care Assistance Level of Assistance  Bathing, Feeding, Dressing Bathing Assistance: Limited assistance Feeding assistance: Limited  assistance Dressing Assistance: Limited assistance     Functional Limitations Info             [place]  PT (By licensed PT), OT (By licensed OT)                    Contractures      Additional Factors Info  Code Status, Allergies Code Status Info: Partial Code Allergies Info: Zithromax, Iodine, Ivp Dye, Shellfish            Discharge Medications: Medication List    STOP taking these medications       levofloxacin 500 MG tablet  Commonly known as: LEVAQUIN     sulfamethoxazole-trimethoprim 200-40 MG/5ML suspension  Commonly known as: BACTRIM,SEPTRA      TAKE these medications       acidophilus Caps capsule  Take 1 capsule by mouth daily.     albuterol (5 MG/ML) 0.5% nebulizer solution  Commonly known as: PROVENTIL  Take 0.5 mLs (2.5 mg total) by nebulization 2 (two) times daily.     amoxicillin-clavulanate 500-125 MG tablet  Commonly known as: AUGMENTIN  Take 1 tablet (500 mg total) by mouth every 12 (twelve) hours.     AZILECT 1 MG Tabs tablet  Generic drug: rasagiline  Take 1 mg by mouth daily. 1400.     BENEFIBER PO  Take 1 scoop by mouth daily.     budesonide 0.25 MG/2ML nebulizer solution  Commonly known as: PULMICORT  Take 0.25 mg by nebulization  every morning.     CALTRATE 600 PLUS-VIT D PO  Take 1 tablet by mouth 2 (two) times daily.     cholecalciferol 1000 units tablet  Commonly known as: VITAMIN D  Take 3,000 Units by mouth daily.     cyanocobalamin 1000 MCG/ML injection  Commonly known as: (VITAMIN B-12)  every 30 (thirty) days.     denosumab 60 MG/ML Soln injection  Commonly known as: PROLIA  Inject 60 mg into the skin every 6 (six) months. Administer in upper arm, thigh, or abdomen     DIABETIC TUSSIN EX PO  Take 5 mLs by mouth every 6 (six) hours as needed (congestion/cough.).     ENSURE PLUS Liqd  Take 237 mLs by  mouth daily.     EXELON 13.3 MG/24HR Pt24  Generic drug: Rivastigmine  Place 1 patch onto the skin daily.     fluticasone 50 MCG/ACT nasal spray  Commonly known as: FLONASE  Place 1 spray into both nostrils daily.     FLUTTER Devi  Use at least 4 times a day     ibuprofen 400 MG tablet  Commonly known as: ADVIL,MOTRIN  Take 400 mg by mouth every 6 (six) hours as needed for mild pain or moderate pain.     levothyroxine 25 MCG tablet  Commonly known as: SYNTHROID, LEVOTHROID  Take 25 mcg by mouth daily before breakfast.     loperamide 2 MG capsule  Commonly known as: IMODIUM  Take 2 mg by mouth daily after breakfast.     NEUPRO 4 MG/24HR  Generic drug: rotigotine  Place 1 patch onto the skin daily.     ranitidine 75 MG tablet  Commonly known as: ZANTAC  Take 75 mg by mouth 2 (two) times daily.     RYTARY 48.75-195 MG Cpcr  Generic drug: Carbidopa-Levodopa ER  Take 1 tablet by mouth 3 (three) times daily.          Relevant Imaging Results:  Relevant Lab Results:   Additional Information SSN: [id number]  Standley Brooking, LCSW

## 2015-07-20 NOTE — Care Management Note (Signed)
Case Management Note  Patient Details  Name: JORI JAMIL MRN: [id number] Date of Birth: 10-08-1923  Subjective/Objective:  Confirmed w/CSW patient going back to [facility]F-[facility]. [facility] to deliver home 02 to facility.                  Action/Plan:d/c back to ALF   Expected Discharge Date:                  Expected Discharge Plan:  [place]  In-House Referral:     Discharge planning Services  CM Consult  Post Acute Care Choice:    Choice offered to:     DME Arranged:    DME Agency:     HH Arranged:  PT HH Agency:   ([facility] memory care)  Status of Service:  Completed, signed off  Medicare Important Message Given:  Yes Date Medicare IM Given:    Medicare IM give by:    Date Additional Medicare IM Given:    Additional Medicare Important Message give by:     If discussed at [place] of Stay Meetings, dates discussed:    Additional Comments:  Dessa Phi, RN 07/20/2015, 2:43 PM

## 2015-07-20 NOTE — Care Management Note (Signed)
Case Management Note  Patient Details  Name: Shannon Dunn MRN: [id number] Date of Birth: 05-Jul-1924  Subjective/Objective: HHPT order, faxed to [facility] [phone], w/confirmation. Spoke to [place] @ [place] Lillie Fragmin will contact CSW to confirm if d/c plan to ALF or SNF.Will inform [facility] dme rep of current status also to hold off on home 02 to deliver to ALF.                   Action/Plan:   Expected Discharge Date:                  Expected Discharge Plan:  [place]  In-House Referral:     Discharge planning Services  CM Consult  Post Acute Care Choice:    Choice offered to:     DME Arranged:    DME Agency:     HH Arranged:  PT HH Agency:   ([facility] memory care)  Status of Service:  Completed, signed off  Medicare Important Message Given:  Yes Date Medicare IM Given:    Medicare IM give by:    Date Additional Medicare IM Given:    Additional Medicare Important Message give by:     If discussed at [place] of Stay Meetings, dates discussed:    Additional Comments:  Dessa Phi, RN 07/20/2015, 2:36 PM

## 2015-07-20 NOTE — Evaluation (Signed)
Physical Therapy Evaluation Patient Details Name: Shannon Dunn MRN: [id number] DOB: 04/16/1924 Today's Date: 07/20/2015   History of Present Illness  82 y.o. female with h/o parkinsons, ventral hernia, COPD, dementia admitted with SOB. Dx of HCAP.   Clinical Impression  Pt ambulated 63' with hand held assist of 2 for balance. At baseline she walks independently without an assistive device. SaO2 84% on RA at rest, 87-94% on 4L O2 [place] with walking. Supplemental O2 recommended, RN aware. Plan is to DC to SNF today, PT at SNF recommended.     Follow Up Recommendations SNF    Equipment Recommendations  None recommended by PT    Recommendations for Other Services       Precautions / Restrictions Precautions Precautions: Fall Precaution Comments: daughter reports 3 falls in past year (2 getting up to bathroom at night, 1 trip on bedspread on floor) Restrictions Weight Bearing Restrictions: No      Mobility  Bed Mobility Overal bed mobility: Needs Assistance Bed Mobility: Supine to Sit     Supine to sit: Mod assist     General bed mobility comments: mod A for BLEs and to raise trunk; used pad to scoot hips to EOB  Transfers Overall transfer level: Needs assistance Equipment used: Rolling walker (2 wheeled) Transfers: Sit to/from Stand Sit to Stand: Mod assist;+2 safety/equipment         General transfer comment: MOd A of 2 to rise, and for safety, balance, management of O2, heart monitor and pulse ox  Ambulation/Gait Ambulation/Gait assistance: +2 safety/equipment;+2 physical assistance Ambulation Distance (Feet): 60 Feet Assistive device: 2 person hand held assist Gait Pattern/deviations: Step-through pattern;Decreased step length - right;Decreased step length - left;Trunk flexed   Gait velocity interpretation: Below normal speed for age/gender General Gait Details: initially attempted RW however pt not able to use safety so then tried HHA of 2 which was safer; HHA  of 2 for balance; SaO2 87-94% on 4L O2 walking, no dyspnea, verbal cues for pursed lip breathing  Stairs            Wheelchair Mobility    Modified Rankin (Stroke Patients Only)       Balance Overall balance assessment: Needs assistance Sitting-balance support: Single extremity supported;Feet supported Sitting balance-Leahy Scale: Fair     Standing balance support: Bilateral upper extremity supported Standing balance-Leahy Scale: Poor Standing balance comment: requires BUE support                             Pertinent Vitals/Pain Pain Assessment: No/denies pain    Home Living Family/patient expects to be discharged to:: Skilled nursing facility                 Additional Comments: memory care unit    Prior Function Level of Independence: Independent         Comments: independent with mobility, no assistive device     Hand Dominance        Extremity/Trunk Assessment   Upper Extremity Assessment: Generalized weakness           Lower Extremity Assessment: Generalized weakness (knee extension at least 3/5, pt unable to follow directions for manual muscle testing)      Cervical / Trunk Assessment: Kyphotic  Communication   Communication: No difficulties  Cognition Arousal/Alertness: Awake/alert Behavior During Therapy: WFL for tasks assessed/performed Overall Cognitive Status: History of cognitive impairments - at baseline (some difficulty following directions due to dementia)  General Comments      Exercises        Assessment/Plan    PT Assessment All further PT needs can be met in the next venue of care  PT Diagnosis Difficulty walking;Generalized weakness   PT Problem List Cardiopulmonary status limiting activity;Decreased activity tolerance;Decreased balance;Decreased mobility;Decreased safety awareness  PT Treatment Interventions     PT Goals (Current goals can be found in the Care Plan  section) Acute Rehab PT Goals PT Goal Formulation: All assessment and education complete, DC therapy (pt to return to SNF today)    Frequency     Barriers to discharge        Co-evaluation               End of Session Equipment Utilized During Treatment: Gait belt;Oxygen Activity Tolerance: Patient tolerated treatment well Patient left: with call bell/phone within reach;with nursing/sitter in room;with family/visitor present (on commode with CNA and daughter in bathroom) Nurse Communication: Mobility status         Time: 4483-0159 PT Time Calculation (min) (ACUTE ONLY): 42 min   Charges:   PT Evaluation $PT Eval Moderate Complexity: 1 Procedure PT Treatments $Gait Training: 8-22 mins $Therapeutic Activity: 8-22 mins   PT G Codes:        Philomena Doheny 07/20/2015, 11:31 AM [phone]

## 2015-07-20 NOTE — Progress Notes (Signed)
Faith Rogue, charge nurse, reported that the son of the patient came to the nurse station and stated oxygen was delivered to [facility].

## 2015-07-20 NOTE — Progress Notes (Signed)
Patient is set to discharge back to [facility] today. Patient & son, Eddie Dibbles at bedside aware. Discharge packet given to RN, Susie. [facility] will be called for transport once oxygen has been delivered to ALF.     Raynaldo Opitz, [facility] Clinical Social Worker cell #: [phone]

## 2015-07-20 NOTE — Progress Notes (Addendum)
SATURATION QUALIFICATIONS: (This note is used to comply with regulatory documentation for home oxygen)  Patient Saturations on Room Air at Rest = 84%      Please briefly explain why patient needs home oxygen: to maintain appropriate SaO2 levels  Shannon Dunn PT 07/20/2015  [id number]

## 2015-07-20 NOTE — Care Management Important Message (Signed)
Important Message  Patient Details IM Letter given to Kathy/Case Manager to present to [place] Message  Patient Details  Name: Shannon Dunn MRN: [id number] Date of Birth: 11-05-1923   Medicare Important Message Given:  Yes    Camillo Flaming 07/20/2015, 2:16 PM Name: Shannon Dunn MRN: [id number] Date of Birth: [birth year]   Medicare Important Message Given:  Yes    Camillo Flaming 07/20/2015, 2:15 PM

## 2015-07-21 LAB — CULTURE, BLOOD (ROUTINE X 2)
CULTURE: NO GROWTH
Culture: NO GROWTH

## 2015-07-21 NOTE — NC FL2 (Signed)
[place] LEVEL OF CARE SCREENING TOOL     IDENTIFICATION  Patient Name: Shannon Dunn Birthdate: 30-Aug-1923 Sex: female Admission Date (Current Location): 07/16/2015  [facility] and [place] Number:  Herbalist and Address:  [facility],  [place] [address], [place]      Provider Number: [id number]  Attending Physician Name and Address:  No att. providers found  Relative Name and Phone Number:       Current Level of Care: Hospital Recommended Level of Care: [place], Memory Care Prior Approval Number:    Date Approved/Denied:   PASRR Number:    Discharge Plan: Other (Comment) (ALF/Memory Care Unit)    Current Diagnoses: Patient Active Problem List   Diagnosis Date Noted  . Physical deconditioning 07/19/2015  . Acute respiratory failure with hypoxia ([place]) 07/18/2015  . HCAP (healthcare-associated pneumonia) 07/16/2015  . Obstructive bronchiectasis ([place]) 04/29/2015  . H/O malignant neoplasm of skin 12/24/2014  . Cancer ([place]) 04/16/2014  . Mammary Pagets disease ([place]) 04/16/2014  . Cancer of nipple and areola of female breast ([place]) 04/16/2014  . Bronchiectasis without acute exacerbation ([place]) 03/24/2013  . Pernicious anemia   . Dementia   . Vitamin D deficiency   . Osteoporosis   . Parkinson's disease ([place])   . Hernia of anterior abdominal wall 03/02/2013  . H/O nutritional disorder 01/15/2013  . Personal history of nutritional deficiency 01/15/2013    Orientation RESPIRATION BLADDER Height & Weight    Self  O2 (4L) Incontinent 4\' 11"  (149.9 cm) 91 lbs.  BEHAVIORAL SYMPTOMS/MOOD NEUROLOGICAL BOWEL NUTRITION STATUS      Incontinent Diet (regular)  AMBULATORY STATUS COMMUNICATION OF NEEDS Skin   Limited Assist Verbally Normal                       Personal Care Assistance Level of Assistance  Bathing, Feeding, Dressing Bathing Assistance: Limited assistance Feeding assistance: Limited  assistance Dressing Assistance: Limited assistance     Functional Limitations Info             [place]  PT (By licensed PT), OT (By licensed OT)                    Contractures      Additional Factors Info  Code Status, Allergies Code Status Info: Partial Code Allergies Info: Zithromax, Iodine, Ivp Dye, Shellfish           Current Medications (07/21/2015):  This is the current hospital active medication list No current facility-administered medications for this encounter.   Current Outpatient Prescriptions  Medication Sig Dispense Refill  . albuterol (PROVENTIL) (5 MG/ML) 0.5% nebulizer solution Take 0.5 mLs (2.5 mg total) by nebulization 2 (two) times daily. 60 mL 5  . budesonide (PULMICORT) 0.25 MG/2ML nebulizer solution Take 0.25 mg by nebulization every morning.    . Calcium-Vitamin D (CALTRATE 600 PLUS-VIT D PO) Take 1 tablet by mouth 2 (two) times daily.    . Carbidopa-Levodopa ER (RYTARY) 48.75-195 MG CPCR Take 1 tablet by mouth 3 (three) times daily.    . cholecalciferol (VITAMIN D) 1000 UNITS tablet Take 3,000 Units by mouth daily.     . cyanocobalamin (,VITAMIN B-12,) 1000 MCG/ML injection every 30 (thirty) days.    Marland Kitchen denosumab (PROLIA) 60 MG/ML SOLN injection Inject 60 mg into the skin every 6 (six) months. Administer in upper arm, thigh, or abdomen    . ENSURE PLUS (ENSURE  PLUS) LIQD Take 237 mLs by mouth daily.    . fluticasone (FLONASE) 50 MCG/ACT nasal spray Place 1 spray into both nostrils daily.    . GuaiFENesin (DIABETIC TUSSIN EX PO) Take 5 mLs by mouth every 6 (six) hours as needed (congestion/cough.).    Marland Kitchen ibuprofen (ADVIL,MOTRIN) 400 MG tablet Take 400 mg by mouth every 6 (six) hours as needed for mild pain or moderate pain.     Marland Kitchen levothyroxine (SYNTHROID, LEVOTHROID) 25 MCG tablet Take 25 mcg by mouth daily before breakfast.    . loperamide (IMODIUM) 2 MG capsule Take 2 mg by mouth daily after breakfast.    . ranitidine  (ZANTAC) 75 MG tablet Take 75 mg by mouth 2 (two) times daily.    . rasagiline (AZILECT) 1 MG TABS tablet Take 1 mg by mouth daily. 1400.    Marland Kitchen Rivastigmine (EXELON) 13.3 MG/24HR PT24 Place 1 patch onto the skin daily.    . rotigotine (NEUPRO) 4 MG/24HR Place 1 patch onto the skin daily.    . Wheat Dextrin (BENEFIBER PO) Take 1 scoop by mouth daily.    Marland Kitchen acidophilus (RISAQUAD) CAPS capsule Take 1 capsule by mouth daily. 30 capsule 6  . amoxicillin-clavulanate (AUGMENTIN) 500-125 MG tablet Take 1 tablet (500 mg total) by mouth every 12 (twelve) hours. 12 tablet 0  . Respiratory Therapy Supplies (FLUTTER) DEVI Use at least 4 times a day 1 each 0     Discharge Medications: Please see discharge summary for a list of discharge medications.  Relevant Imaging Results:  Relevant Lab Results:   Additional Information SSN: [id number]   Home Health RN, PT/OT to evaluate & treat as needed  Standley Brooking, [place]

## 2015-07-29 ENCOUNTER — Telehealth: Payer: Self-pay | Admitting: Emergency Medicine

## 2015-07-29 NOTE — Telephone Encounter (Signed)
Shannon Dunn [phone] calling back ok to leave a detailed msg

## 2015-07-29 NOTE — Telephone Encounter (Signed)
Called spoke with Verdis Frederickson and gave VO. Nothing further needed

## 2015-07-29 NOTE — Telephone Encounter (Signed)
Pt is scheduled to see Eric Form tomorrow for HFU per pete. Shannon Dunn from [place] calling requesting VO. seen and evaluated for home health PT started yesterday, twice a week for 6 weeks.  Please advise MW thanks

## 2015-07-29 NOTE — Telephone Encounter (Signed)
Fine to order 

## 2015-07-29 NOTE — Telephone Encounter (Signed)
LMOMTCB x1 for [facility]

## 2015-07-30 ENCOUNTER — Encounter: Payer: Self-pay | Admitting: Acute Care

## 2015-07-30 ENCOUNTER — Ambulatory Visit (INDEPENDENT_AMBULATORY_CARE_PROVIDER_SITE_OTHER)
Admission: RE | Admit: 2015-07-30 | Discharge: 2015-07-30 | Disposition: A | Discharge status: Home / Self Care | Payer: Medicare Other | Source: Ambulatory Visit | Attending: Acute Care | Admitting: Acute Care

## 2015-07-30 ENCOUNTER — Ambulatory Visit (INDEPENDENT_AMBULATORY_CARE_PROVIDER_SITE_OTHER): Payer: Medicare Other | Admitting: Acute Care

## 2015-07-30 VITALS — BP 112/62 | HR 81 | Temp 97.6°F | Wt 80.2 lb

## 2015-07-30 DIAGNOSIS — Z8639 Personal history of other endocrine, nutritional and metabolic disease: Secondary | ICD-10-CM

## 2015-07-30 DIAGNOSIS — J189 Pneumonia, unspecified organism: Secondary | ICD-10-CM

## 2015-07-30 MED ORDER — LEVOFLOXACIN 500 MG PO TABS: 500.0000 mg | ORAL_TABLET | Freq: Every day | ORAL | Status: DC

## 2015-07-30 NOTE — Patient Instructions (Addendum)
Your pneumonia is improved, but not cleared. We will start Levaquin 500 mg for 7 days. Continue probiotics ( Culturelle or Align ) while on the antibiotics, and eat Activia yogurt. Mucinex  600 mg every 12 hours to help with secretions. Continue to wear your oxygen at 3 L until pneumonia has resolved.We will advise when time to adjust. Continue the nebulized treatments as you have been doing. Use the flutter valve when alert 4 times a day. I will have a nurse instruct you on the use. We will see you in 2 weeks for follow up x-ray. Please contact office for sooner follow up if symptoms do not improve or worsen or seek emergency care

## 2015-07-30 NOTE — Progress Notes (Signed)
Subjective:    Patient ID: Shannon Dunn, female    DOB: October 30, 1923, 82 y.o.   MRN: [id number]  HPI  50 yowf never smoker with documented bronchiectasis followed previously followed by Dr Joya Gaskins. Recent hospitalization for HAP/lingular pneumonia / respiratory failure 06/2015.  Significant Studies./ Events  Hospital Admission: 07/16/15-07/20/2015. CXR: 07/15/2016:Lingular pneumonia, less likely atelectasis. CXR 07/30/2015 :  Persistent but slightly improved lingular pneumonia.Recomment continued follow up.   07/30/15: Hospital Follow Up: Recent hospitalization 07/16/15-07/20/15 for HCAP / hypoxemic respiratory failure. Respiratory Virus panel was positive for Influenza A, sub type: H3. Patient continues to have green thick sputum and cough at hospital follow up appointment today. She remains on oxygen at 3 L [place] at skilled nursing facility. Recent trial at skilled nursing facility showed Saturations dropped to 91% on RA while patient was at rest. Pt with significant dementia, family state no fever, but cough with green thick mucus.State she is 75% better.She has days of activity followed by days of lethargy.  Past Medical History  Diagnosis Date  . Colon cancer ([place])   . Parkinson's disease ([place])   . Osteoporosis   . Vitamin D deficiency   . Dementia   . Ventral hernia   . Pernicious anemia   . COPD (chronic obstructive pulmonary disease) ([place])   . Pagets disease, breast ([place])    Allergies  Allergen Reactions  . Zithromax [Azithromycin] Diarrhea  . Iodine Other (See Comments)    Unknown, might be rash/hives  . Ivp Dye [Iodinated Diagnostic Agents] Other (See Comments)    Unknown rash/hives maybe  . Shellfish Allergy Other (See Comments)    Unknown rash/hives, scallops     Current outpatient prescriptions:  .  acidophilus (RISAQUAD) CAPS capsule, Take 1 capsule by mouth daily., Disp: 30 capsule, Rfl: 6 .  albuterol (PROVENTIL) (5 MG/ML) 0.5% nebulizer solution, Take 0.5 mLs (2.5  mg total) by nebulization 2 (two) times daily., Disp: 60 mL, Rfl: 5 .  budesonide (PULMICORT) 0.25 MG/2ML nebulizer solution, Take 0.25 mg by nebulization 2 (two) times daily. , Disp: , Rfl:  .  Calcium-Vitamin D (CALTRATE 600 PLUS-VIT D PO), Take 1 tablet by mouth 2 (two) times daily., Disp: , Rfl:  .  Carbidopa-Levodopa ER (RYTARY) 48.75-195 MG CPCR, Take 1 tablet by mouth 3 (three) times daily., Disp: , Rfl:  .  cholecalciferol (VITAMIN D) 1000 UNITS tablet, Take 3,000 Units by mouth daily. , Disp: , Rfl:  .  cyanocobalamin (,VITAMIN B-12,) 1000 MCG/ML injection, every 30 (thirty) days., Disp: , Rfl:  .  denosumab (PROLIA) 60 MG/ML SOLN injection, Inject 60 mg into the skin every 6 (six) months. Administer in upper arm, thigh, or abdomen, Disp: , Rfl:  .  ENSURE PLUS (ENSURE PLUS) LIQD, Take 237 mLs by mouth daily., Disp: , Rfl:  .  fluticasone (FLONASE) 50 MCG/ACT nasal spray, Place 1 spray into both nostrils daily., Disp: , Rfl:  .  GuaiFENesin (DIABETIC TUSSIN EX PO), Take 5 mLs by mouth every 6 (six) hours as needed (congestion/cough.)., Disp: , Rfl:  .  loperamide (IMODIUM) 2 MG capsule, Take 2 mg by mouth daily after breakfast., Disp: , Rfl:  .  amoxicillin-clavulanate (AUGMENTIN) 500-125 MG tablet, Take 1 tablet (500 mg total) by mouth every 12 (twelve) hours. (Patient not taking: Reported on 07/30/2015), Disp: 12 tablet, Rfl: 0 .  ibuprofen (ADVIL,MOTRIN) 400 MG tablet, Take 400 mg by mouth every 6 (six) hours as needed for mild pain or moderate pain. Reported on 07/30/2015,  Disp: , Rfl:  .  levofloxacin (LEVAQUIN) 500 MG tablet, Take 1 tablet (500 mg total) by mouth daily., Disp: 7 tablet, Rfl: 0 .  levothyroxine (SYNTHROID, LEVOTHROID) 25 MCG tablet, Take 25 mcg by mouth daily before breakfast., Disp: , Rfl:  .  ranitidine (ZANTAC) 75 MG tablet, Take 75 mg by mouth 2 (two) times daily., Disp: , Rfl:  .  rasagiline (AZILECT) 1 MG TABS tablet, Take 1 mg by mouth daily. 1400., Disp: , Rfl:    .  Respiratory Therapy Supplies (FLUTTER) DEVI, Use at least 4 times a day, Disp: 1 each, Rfl: 0 .  Rivastigmine (EXELON) 13.3 MG/24HR PT24, Place 1 patch onto the skin daily., Disp: , Rfl:  .  rotigotine (NEUPRO) 4 MG/24HR, Place 1 patch onto the skin daily., Disp: , Rfl:  .  Wheat Dextrin (BENEFIBER PO), Take 1 scoop by mouth daily., Disp: , Rfl:   Review of Systems    Constitutional:   + weight loss, night sweats, no Fevers, chills, fatigue, or  lassitude.  HEENT:   No headaches,  Difficulty swallowing,  Tooth/dental problems, or  Sore throat,                No sneezing, itching, ear ache, nasal congestion, post nasal drip,   CV:  No chest pain,  Orthopnea, PND, swelling in lower extremities, anasarca, dizziness, palpitations, syncope.   GI  No heartburn, indigestion, abdominal pain, nausea, vomiting, diarrhea, change in bowel habits, loss of appetite, bloody stools.   Resp: No shortness of breath with exertion or at rest.  + excess mucus, + productive cough,  No non-productive cough,  No coughing up of blood.  + change in color of mucus.  No wheezing.  No chest wall deformity  Skin: no rash or lesions.  GU: no dysuria, change in color of urine, no urgency or frequency.  No flank pain, no hematuria   MS:  No joint pain or swelling.  No decreased range of motion.  No back pain.  Psych:  No change in mood or affect. No depression or anxiety.  Severe dementia     Objective:   Physical Exam BP 112/62 mmHg  Pulse 81  Temp(Src) 97.6 F (36.4 C) (Oral)  Wt 80 lb 3.2 oz (36.378 kg)  SpO2 95%  Physical Exam:  General- Elderly, frail cachetic female in wheel chair, No distress, A&O x 2 , Dementia at baseline per family ENT: No sinus tenderness, TM clear, pale nasal mucosa, no oral exudate,no post nasal drip, no LAN Cardiac: S1, S2, regular rate and rhythm, no murmur Chest: No wheeze/ +  Rales Left/ + dullness Left; no accessory muscle use, no nasal flaring, no sternal  retractions Abd.: Soft Non-tender Ext: No edema Neuro: Debilitated from recent hospitalization Skin: No rashes, warm and dry Psych: normal mood and behavior for patient who has severe de      Assessment & Plan:

## 2015-07-30 NOTE — Assessment & Plan Note (Signed)
Recent hospitalization 07/16/15-07/20/15 for HCAP / hypoxemic respiratory failure. Respiratory Virus panel was positive for Influenza A, sub type: H3. Patient continues to have green thick sputum and cough at hospital follow up appointment today. She remains on oxygen at 3 L [place]. Recent trial at skilled nursing facility showed Saturations dropped to 91% on RA while patient was at rest.  Plan: CXR today indicates Persistent but slightly improved left lingular pneumonia.  We will start Levaquin 500 mg for 7 days. Continue probiotics ( Culturelle or Align ) while on the antibiotics, and eat Activia yogurt. Mucinex  600 mg every 12 hours to help with secretions. Continue to wear your oxygen at 3 L until pneumonia has resolved.We will advise when time to adjust. Continue the nebulized treatments as you have been doing. Use the flutter valve when alert 4 times a day. I will have a nurse instruct you on the use. We will see you in 2 weeks for follow up x-ray. Please contact office for sooner follow up if symptoms do not improve or worsen or seek emergency care

## 2015-07-30 NOTE — Assessment & Plan Note (Signed)
Weight is 80 pounds today, which indicated a 4 pound weight loss.  Plan: Encourage Ensure as meal supplement. Encourage to eat 3 meals daily with snacks as able.

## 2015-07-31 NOTE — Progress Notes (Signed)
Chart and office note reviewed in detail  > agree with a/p as outlined    

## 2015-08-10 ENCOUNTER — Other Ambulatory Visit (HOSPITAL_COMMUNITY): Payer: Self-pay | Admitting: Neurology

## 2015-08-10 DIAGNOSIS — R1310 Dysphagia, unspecified: Secondary | ICD-10-CM

## 2015-08-13 ENCOUNTER — Ambulatory Visit (INDEPENDENT_AMBULATORY_CARE_PROVIDER_SITE_OTHER): Payer: Medicare Other | Admitting: Acute Care

## 2015-08-13 ENCOUNTER — Encounter: Payer: Self-pay | Admitting: Acute Care

## 2015-08-13 ENCOUNTER — Ambulatory Visit (INDEPENDENT_AMBULATORY_CARE_PROVIDER_SITE_OTHER)
Admission: RE | Admit: 2015-08-13 | Discharge: 2015-08-13 | Disposition: A | Discharge status: Home / Self Care | Payer: Medicare Other | Source: Ambulatory Visit | Attending: Acute Care | Admitting: Acute Care

## 2015-08-13 VITALS — BP 102/66 | HR 72 | Wt 76.0 lb

## 2015-08-13 DIAGNOSIS — Z23 Encounter for immunization: Secondary | ICD-10-CM

## 2015-08-13 DIAGNOSIS — J690 Pneumonitis due to inhalation of food and vomit: Secondary | ICD-10-CM | POA: Diagnosis not present

## 2015-08-13 DIAGNOSIS — Z8639 Personal history of other endocrine, nutritional and metabolic disease: Secondary | ICD-10-CM

## 2015-08-13 DIAGNOSIS — J9601 Acute respiratory failure with hypoxia: Secondary | ICD-10-CM | POA: Diagnosis not present

## 2015-08-13 DIAGNOSIS — J189 Pneumonia, unspecified organism: Secondary | ICD-10-CM

## 2015-08-13 NOTE — Progress Notes (Signed)
Subjective:    Patient ID: Shannon Dunn, female    DOB: 06-Dec-1923, 82 y.o.   MRN: [id number]  HPI 70 yowf never smoker with dementia and documented bronchiectasis followed previously followed by Dr Joya Gaskins. Recent hospitalization for HAP/lingular pneumonia / respiratory failure 06/2015.  Significant Studies./ Events   Hospital Admission: 07/16/15-07/20/2015. CXR: 07/15/2016:Lingular pneumonia, less likely atelectasis. CXR 07/30/2015 : Persistent but slightly improved lingular pneumonia.Recomment continued follow up. CXR 08/13/2015:   CHEST 2 VIEW  COMPARISON: Chest x-ray of January 13th 2017, July 16, 2015, and January 26, 2015.  FINDINGS: The patient is rotated on today's study. The lungs are well-expanded. There is increased AP dimension of the thorax. There remain coarse lung markings in the lingula not greatly changed from the prior studies. The heart is not enlarged. The pulmonary vascularity is not engorged. There is no pleural effusion or pneumothorax. There is prominent thoracic kyphosis without high-grade thoracic compression fracture.  IMPRESSION: COPD and chronic scarring and bronchiectatic change in the lingula. There is no definite evidence of acute pneumonia and no evidence of CHF.  08/13/2015   Patient returns to the office today for two-week follow-up. Initial hospital follow-up visit 07/30/2015 chest x-ray indicated persistent lingula pneumonia. The patient was treated with an additional 7 days of Levaquin 500 mg with follow-up today for resolution in this very frail female. Family provide history due to patient's dementia .She is much more alert. Saturations are 99% on oxygen 2 L. Chest x-ray today indicates resolution of pneumonia. There are no clinical signs and symptoms of ongoing infection. Family states the patient no longer has cough, or greenish yellow sputum. She lives with her husband at an assisted living facility with close oversight and  monitoring by their son and daughter. Oxygen saturations  when oxygen was decreased to 1 L nasal cannula at rest and remained 99%. I have discussed with family the need to be proactive and seek medical attention at the earliest sign that the patient may be developing a  respiratory infection in order to avoid future hospitalizations if possible. Patient continues to lose weight. An additional 2 pound weight loss since last visit 2 weeks ago. Encourage nutritional supplementation between meals if possible with boost.  Past Medical History  Diagnosis Date  . Colon cancer ([place])   . Parkinson's disease ([place])   . Osteoporosis   . Vitamin D deficiency   . Dementia   . Ventral hernia   . Pernicious anemia   . COPD (chronic obstructive pulmonary disease) ([place])   . 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  acidophilus (RISAQUAD) CAPS capsule, Take 1 capsule by mouth daily., Disp: 30 capsule, Rfl: 6 .  albuterol (PROVENTIL) (5 MG/ML) 0.5% nebulizer solution, Take 0.5 mLs (2.5 mg total) by nebulization 2 (two) times daily., Disp: 60 mL, Rfl: 5 .  budesonide (PULMICORT) 0.25 MG/2ML nebulizer solution, Take 0.25 mg by nebulization 2 (two) times daily. , Disp: , Rfl:  .  Calcium-Vitamin D (CALTRATE 600 PLUS-VIT D PO), Take 1 tablet by mouth 2 (two) times daily., Disp: , Rfl:  .  Carbidopa-Levodopa ER (RYTARY) 48.75-195 MG CPCR, Take 1 tablet by mouth 3 (three) times daily., Disp: , Rfl:  .  cholecalciferol (VITAMIN D) 1000 UNITS tablet, Take 3,000 Units by mouth daily. , Disp: , Rfl:  .  cyanocobalamin (,VITAMIN B-12,) 1000 MCG/ML injection, every 30 (thirty) days., Disp: , Rfl:  .  denosumab (PROLIA) 60 MG/ML SOLN injection, Inject  60 mg into the skin every 6 (six) months. Administer in upper arm, thigh, or abdomen, Disp: , Rfl:  .  ENSURE PLUS (ENSURE PLUS) LIQD, Take 237 mLs by mouth daily., Disp: , Rfl:  .  fluticasone (FLONASE) 50 MCG/ACT nasal spray, Place 1 spray into  both nostrils daily., Disp: , Rfl:  .  GuaiFENesin (DIABETIC TUSSIN EX PO), Take 5 mLs by mouth every 6 (six) hours as needed (congestion/cough.)., Disp: , Rfl:  .  ibuprofen (ADVIL,MOTRIN) 400 MG tablet, Take 400 mg by mouth every 6 (six) hours as needed for mild pain or moderate pain. Reported on 07/30/2015, Disp: , Rfl:  .  levothyroxine (SYNTHROID, LEVOTHROID) 25 MCG tablet, Take 25 mcg by mouth daily before breakfast., Disp: , Rfl:  .  loperamide (IMODIUM) 2 MG capsule, Take 2 mg by mouth daily after breakfast., Disp: , Rfl:  .  ranitidine (ZANTAC) 75 MG tablet, Take 75 mg by mouth 2 (two) times daily., Disp: , Rfl:  .  rasagiline (AZILECT) 1 MG TABS tablet, Take 1 mg by mouth daily. 1400., Disp: , Rfl:  .  Respiratory Therapy Supplies (FLUTTER) DEVI, Use at least 4 times a day, Disp: 1 each, Rfl: 0 .  Rivastigmine (EXELON) 13.3 MG/24HR PT24, Place 1 patch onto the skin daily., Disp: , Rfl:  .  rotigotine (NEUPRO) 4 MG/24HR, Place 1 patch onto the skin daily., Disp: , Rfl:  .  Wheat Dextrin (BENEFIBER PO), Take 1 scoop by mouth daily., Disp: , Rfl:    Allergies  Allergen Reactions  . Zithromax [Azithromycin] Diarrhea  . Iodine Other (See Comments)    Unknown, might be rash/hives  . Ivp Dye [Iodinated Diagnostic Agents] Other (See Comments)    Unknown rash/hives maybe  . Shellfish Allergy Other (See Comments)    Unknown rash/hives, scallops     Review of Systems Constitutional:   +  weight loss, no night sweats,  Fevers, chills, fatigue, or  lassitude.  HEENT:   No headaches,  Difficulty swallowing,  Tooth/dental problems, or  Sore throat,                No sneezing, itching, ear ache, nasal congestion, post nasal drip,   CV:  No chest pain,  Orthopnea, PND, swelling in lower extremities, anasarca, dizziness, palpitations, syncope.   GI  No heartburn, indigestion, abdominal pain, nausea, vomiting, diarrhea, change in bowel habits, loss of appetite, bloody stools.   Resp: No  shortness of breath with exertion or at rest.  No excess mucus, no productive cough,  No non-productive cough,  No coughing up of blood.  No change in color of mucus.  No wheezing.  No chest wall deformity  Skin: no rash or lesions.  GU: no dysuria, change in color of urine, no urgency or frequency.  No flank pain, no hematuria   MS:  No joint pain or swelling.  No decreased range of motion.  No back pain.  Psych:  No change in mood or affect. No depression or anxiety.  No memory loss.        Objective:   Physical Exam BP 102/66 mmHg  Pulse 72  Wt 76 lb (34.473 kg)  SpO2 99%   Physical Exam:  General- No distress,  severe dementia but patient is alert to person and family members currently in a wheelchair ENT: No sinus tenderness, TM clear, pale nasal mucosa, no oral exudate,no post nasal drip, no LAN Cardiac: S1, S2, regular rate and rhythm, no murmur Chest:  No wheeze/ rales/ dullness; no accessory muscle use, no nasal flaring, no sternal retractions Abd.: Soft Non-tender Ext: No edema Neuro:  Deconditioning secondary to recent hospitalization. Skin: No rashes, warm and dry Psych: Baseline for patient, pleasantly confused.        Assessment & Plan:

## 2015-08-13 NOTE — Patient Instructions (Addendum)
You look great today. You can stop the mucinex today. Your chest x ray shows the pneumonia has resolved. Call the office if there is a change in her chest congestion or if you notice increase in cough, fever, colored sputum. We would rather be proactive than reactive. We will check your oxygen today on 1 L at rest. Your saturation was 99% on 1 liter at rest. You can use 1 L at rest and increase to 2 L  With activity or exertion. Please monitor saturations with activity.  Goal is 88-90%. Flu vaccine today. Supplements with meals as you have lost an additional 2 pounds. Follow up office with Dr. Melvyn Novas in 1 month. Please contact office for sooner follow up if symptoms do not improve or worsen or seek emergency care

## 2015-08-13 NOTE — Assessment & Plan Note (Signed)
Patient has had an additional 2 pound weight loss. Husband states that it is very difficult to get her to eat 2/2 severe dementia. Plan: Encourage 3 meals daily when patient is alert. Encouraged and sure or based as a meal supplement when patient is alert. Encourage snacks when patient is alert.

## 2015-08-13 NOTE — Assessment & Plan Note (Signed)
Continued Clinical Improvement. No clinical signs or symptoms of ongoing infection. Resolution  Of pneumonia per CXR  Plan: Close monitoring for change in pulmonary status by family and medical staff at [facility]. May decrease oxygen to 1 L while at rest. Maintain saturations >88%. Call the office for any changes to allow [place] to be proactive rather than reative in treatment and avoid further hospitalizations in this very frail female. Follow up with Dr. Melvyn Novas in 1 month. Call sooner if needed.

## 2015-08-13 NOTE — Assessment & Plan Note (Signed)
Saturations today on 2L were 99%.  No clinical signs of pneumonia.  Plan: We will check your oxygen today on 1 L at rest. Your saturation was 99% on 1 liter at rest. You can use 1 L at rest and increase to 2 L  With activity or exertion. Please monitor saturations with activity.  Goal is 88-90%. Follow up office with Dr. Melvyn Novas in 1 month. Please contact office for sooner follow up if symptoms do not improve or worsen or seek emergency care  Flu vaccine today.

## 2015-08-14 ENCOUNTER — Encounter: Payer: Self-pay | Admitting: Acute Care

## 2015-08-14 DIAGNOSIS — J479 Bronchiectasis, uncomplicated: Secondary | ICD-10-CM

## 2015-08-16 MED ORDER — ALBUTEROL SULFATE (5 MG/ML) 0.5% IN NEBU: 2.5000 mg | INHALATION_SOLUTION | Freq: Two times a day (BID) | RESPIRATORY_TRACT | Status: DC

## 2015-08-16 NOTE — Telephone Encounter (Signed)
[phone] Shannon Dunn calling about the oxygen

## 2015-08-17 ENCOUNTER — Telehealth: Payer: Self-pay | Admitting: Internal Medicine

## 2015-08-17 NOTE — Telephone Encounter (Signed)
Called spoke with Lacie w/ [facility]. She reports the staff at [facility] is not able to monitor pt sats at rest/exertion. They received an order stating to make sure pt O2 sats stay 88% on evertion. They are needing an order to d/c this since they unable to do this for pt.  Please advise Dr. Melvyn Novas thanks

## 2015-08-17 NOTE — Telephone Encounter (Signed)
LMTB for lacie

## 2015-08-17 NOTE — Telephone Encounter (Signed)
Ok to d/c.

## 2015-08-18 NOTE — Telephone Encounter (Signed)
Spoke with Lacie and advised per Dr Melvyn Novas that it is ok to d/c order to check oxygen sats. Order faxed to [facility] per her request.

## 2015-08-18 NOTE — Telephone Encounter (Signed)
[facility] [place] [facility] is now full  [facility]

## 2015-08-18 NOTE — Telephone Encounter (Signed)
lacie returned call [phone]

## 2015-08-25 ENCOUNTER — Other Ambulatory Visit (HOSPITAL_COMMUNITY): Payer: Medicare Other

## 2015-08-25 ENCOUNTER — Ambulatory Visit (HOSPITAL_COMMUNITY): Admission: RE | Admit: 2015-08-25 | Payer: Medicare Other | Source: Ambulatory Visit

## 2015-09-01 ENCOUNTER — Telehealth: Payer: Self-pay | Admitting: Internal Medicine

## 2015-09-01 NOTE — Telephone Encounter (Signed)
[place] for PT but she should have a pcp running the show, no?

## 2015-09-01 NOTE — Telephone Encounter (Signed)
Called spoke with Verdis Frederickson from [place] and made her aware of below.  She reports pt does have PCP and will contact there office. Nothing further needed

## 2015-09-01 NOTE — Telephone Encounter (Signed)
Per 08/13/15 with Sarah: Patient Instructions       You look great today. You can stop the mucinex today. Your chest x ray shows the pneumonia has resolved. Call the office if there is a change in her chest congestion or if you notice increase in cough, fever, colored sputum. We would rather be proactive than reactive. We will check your oxygen today on 1 L at rest. Your saturation was 99% on 1 liter at rest. You can use 1 L at rest and increase to 2 L  With activity or exertion. Please monitor saturations with activity.  Goal is 88-90%. Flu vaccine today. Supplements with meals as you have lost an additional 2 pounds. Follow up office with Dr. Melvyn Novas in 1 month. Please contact office for sooner follow up if symptoms do not improve or worsen or seek emergency care   -----  Please advise MW if okay to give VO for PT? thanks

## 2015-09-08 ENCOUNTER — Telehealth: Payer: Self-pay | Admitting: Acute Care

## 2015-09-08 NOTE — Telephone Encounter (Signed)
Mandy calling back [phone]

## 2015-09-08 NOTE — Telephone Encounter (Signed)
Called spoke with [facility] from [place].  She reports they received a call from [employer] requesting pt to change from [facility] to [facility]. She has not spoken with [employer] personally nor the patient.  Called pt and LMTCB x1

## 2015-09-08 NOTE — Telephone Encounter (Signed)
LMTCB for Shannon Dunn w/ Shannon Dunn

## 2015-09-09 NOTE — Telephone Encounter (Signed)
lmtcb x2 for pt. 

## 2015-09-10 NOTE — Telephone Encounter (Signed)
Called and spoke to pt's husband. Pt is requesting to switch from [facility] to [place] because of customer service issues. Pt using 1lpm O2 24/7. Pt has an upcoming appt with MW on 2.27.17 and will discuss change in DME companies at that appt time. Nothing further needed at this time.

## 2015-09-13 ENCOUNTER — Ambulatory Visit (INDEPENDENT_AMBULATORY_CARE_PROVIDER_SITE_OTHER): Payer: Medicare Other | Admitting: Internal Medicine

## 2015-09-13 ENCOUNTER — Encounter: Payer: Self-pay | Admitting: Internal Medicine

## 2015-09-13 VITALS — BP 106/70 | HR 91 | Ht <= 58 in | Wt 82.8 lb

## 2015-09-13 DIAGNOSIS — J471 Bronchiectasis with (acute) exacerbation: Secondary | ICD-10-CM

## 2015-09-13 DIAGNOSIS — J9611 Chronic respiratory failure with hypoxia: Secondary | ICD-10-CM | POA: Insufficient documentation

## 2015-09-13 DIAGNOSIS — J479 Bronchiectasis, uncomplicated: Secondary | ICD-10-CM

## 2015-09-13 NOTE — Assessment & Plan Note (Signed)
09/13/2015   Walked RA x one lap @ 185 stopped due to  End of study, slow pace, no sob or desat  > d/c 02

## 2015-09-13 NOTE — Progress Notes (Signed)
Subjective:    Patient ID: Shannon Dunn, female    DOB: 09/06/1923,    MRN: [id number]  HPI 57 yowf never smoker with dementia and documented bronchiectasis followed previously followed by Dr Joya Gaskins. Recent hospitalization for HAP/lingular pneumonia / respiratory failure 06/2015.  Significant Studies./ Events       08/13/2015 NP ov Patient returns to the office today for two-week follow-up. Initial hospital follow-up visit 07/30/2015 chest x-ray indicated persistent lingula pneumonia. The patient was treated with an additional 7 days of Levaquin 500 mg with follow-up today for resolution in this very frail female. Family provide history due to patient's dementia .She is much more alert. Saturations are 99% on oxygen 2 L. Chest x-ray today indicates resolution of pneumonia. There are no clinical signs and symptoms of ongoing infection. Family states the patient no longer has cough, or greenish yellow sputum. She lives with her husband at an assisted living facility with close oversight and monitoring by their son and daughter. Oxygen saturations  when oxygen was decreased to 1 L nasal cannula at rest and remained 99%. I have discussed with family the need to be proactive and seek medical attention at the earliest sign that the patient may be developing a  respiratory infection in order to avoid future hospitalizations if possible. Patient continues to lose weight. An additional 2 pound weight loss since last visit 2 weeks ago. Encourage nutritional supplementation between meals if possible with boost. rec  You can stop the mucinex today. Your chest x ray shows the pneumonia has resolved. Call the office if there is a change in her chest congestion or if you notice increase in cough, fever, colored sputum. We would rather be proactive than reactive. We will check your oxygen today on 1 L at rest. Your saturation was 99% on 1 liter at rest. You can use 1 L at rest and increase to 2 L  With  activity or exertion. Please monitor saturations with activity. Goal is 88-90%. Flu vaccine today. Supplements with meals as you have lost an additional 2 pounds. Follow up office with Dr. Melvyn Novas in 1 month. Please contact office for sooner follow up if symptoms do not improve or worsen or seek emergency care    09/13/2015  f/u ov/Kostantinos Tallman re: obst bronchiectasis 02 2lpm 24/7  Chief Complaint  Patient presents with  . Follow-up    Pt c/o dizziness related to her breathing.   fm concerned 02 is hold her back from better mobilization and doesn't really need it. Not limited by breathing per fm but rather by weakness and unsteady on feet.  No recent fever or purulent sputum/ swallowing ok   No obvious day to day or daytime variability or assoc  cp or chest tightness, subjective wheeze or overt sinus or hb symptoms. No unusual exp hx or h/o childhood pna/ asthma or knowledge of premature birth.  Sleeping ok without nocturnal  or early am exacerbation  of respiratory  c/o's or need for noct saba. Also denies any obvious fluctuation of symptoms with weather or environmental changes or other aggravating or alleviating factors except as outlined above   Current Medications, Allergies, Complete Past Medical History, Past Surgical History, Family History, and Social History were reviewed in [employer] record.  ROS  The following are not active complaints unless bolded sore throat, dysphagia, dental problems, itching, sneezing,  nasal congestion or excess/ purulent secretions, ear ache,   fever, chills, sweats, unintended wt loss, classically pleuritic or  exertional cp, hemoptysis,  orthopnea pnd or leg swelling, presyncope, palpitations, abdominal pain, anorexia, nausea, vomiting, diarrhea  or change in bowel or bladder habits, change in stools or urine, dysuria,hematuria,  rash, arthralgias, visual complaints, headache, numbness, weakness or ataxia or problems with walking or  coordination,  change in mood/affect or memory.                Objective:     Physical Exam:  Elderly wf nad more weak that sob appearing / vital signs reviewed   Wt Readings from Last 3 Encounters:  09/13/15 82 lb 12.8 oz (37.558 kg)  08/13/15 76 lb (34.473 kg)  07/30/15 80 lb 3.2 oz (36.378 kg)    Vital signs reviewed   General- No distress,  severe dementia but patient is alert to person and family members currently in a wheelchair ENT: No sinus tenderness, TM clear, pale nasal mucosa, no oral exudate,no post nasal drip, no LAN Cardiac: S1, S2, regular rate and rhythm, no murmur Chest:   no accessory muscle use, no nasal flaring, no sternal retractions Junky rhonchi bilaterally/ congested cough on voluntary maneuver Abd.: Soft Non-tender Ext: No edema Neuro:  Deconditioning secondary to recent hospitalization. Skin: No rashes, warm and dry Psych: Baseline for patient, pleasantly confused.     I personally reviewed images and agree with radiology impression as follows:  CXR:  08/13/15 COPD and chronic scarring and bronchiectatic change in the lingula. There is no definite evidence of acute pneumonia and no evidence of CHF.      Assessment & Plan:

## 2015-09-13 NOTE — Assessment & Plan Note (Addendum)
Well compensated on neb saba/ics /flutter but really nothing else to offer here other than be careful with swallowing/ promote better coughing with flutter/ mobilization   I had an extended final summary discussion with the patient reviewing all relevant studies completed to date and  lasting 15 to 20 minutes of a 25 minute visit on the following issues:    Each maintenance medication was reviewed in detail including most importantly the difference between maintenance and as needed and under what circumstances the prns are to be used.  Please see instructions for details which were reviewed in writing and the patient given a copy.    Would strongly consider a palliative/ ? Hospice approach here rather than re-escalation of care

## 2015-09-13 NOTE — Patient Instructions (Addendum)
Does not need oxygen at this point- we will call to cancel it   Need to use the flutter valve more  No pulmonary follow up is needed

## 2015-11-04 ENCOUNTER — Emergency Department (HOSPITAL_COMMUNITY): Payer: Medicare Other

## 2015-11-04 ENCOUNTER — Encounter (HOSPITAL_COMMUNITY): Payer: Self-pay | Admitting: *Deleted

## 2015-11-04 ENCOUNTER — Inpatient Hospital Stay (HOSPITAL_COMMUNITY)
Admission: EM | Admit: 2015-11-04 | Discharge: 2015-11-08 | DRG: 480 | Disposition: A | Discharge status: Skilled Nursing Facility | Payer: Medicare Other | Attending: Internal Medicine | Admitting: Internal Medicine

## 2015-11-04 ENCOUNTER — Inpatient Hospital Stay (HOSPITAL_COMMUNITY): Payer: Medicare Other | Admitting: Certified Registered"

## 2015-11-04 ENCOUNTER — Encounter (HOSPITAL_COMMUNITY)
Admission: EM | Disposition: A | Discharge status: Skilled Nursing Facility | Payer: Self-pay | Source: Home / Self Care | Attending: Orthopedic Surgery

## 2015-11-04 ENCOUNTER — Inpatient Hospital Stay (HOSPITAL_COMMUNITY): Payer: Medicare Other

## 2015-11-04 DIAGNOSIS — J189 Pneumonia, unspecified organism: Secondary | ICD-10-CM

## 2015-11-04 DIAGNOSIS — S7290XA Unspecified fracture of unspecified femur, initial encounter for closed fracture: Secondary | ICD-10-CM | POA: Diagnosis present

## 2015-11-04 DIAGNOSIS — F0390 Unspecified dementia without behavioral disturbance: Secondary | ICD-10-CM | POA: Diagnosis present

## 2015-11-04 DIAGNOSIS — Z7951 Long term (current) use of inhaled steroids: Secondary | ICD-10-CM

## 2015-11-04 DIAGNOSIS — G20 Parkinson's disease: Secondary | ICD-10-CM | POA: Diagnosis not present

## 2015-11-04 DIAGNOSIS — E43 Unspecified severe protein-calorie malnutrition: Secondary | ICD-10-CM | POA: Insufficient documentation

## 2015-11-04 DIAGNOSIS — Z881 Allergy status to other antibiotic agents status: Secondary | ICD-10-CM

## 2015-11-04 DIAGNOSIS — Z66 Do not resuscitate: Secondary | ICD-10-CM | POA: Diagnosis present

## 2015-11-04 DIAGNOSIS — G92 Toxic encephalopathy: Secondary | ICD-10-CM | POA: Diagnosis present

## 2015-11-04 DIAGNOSIS — J449 Chronic obstructive pulmonary disease, unspecified: Secondary | ICD-10-CM | POA: Diagnosis present

## 2015-11-04 DIAGNOSIS — Z853 Personal history of malignant neoplasm of breast: Secondary | ICD-10-CM

## 2015-11-04 DIAGNOSIS — E559 Vitamin D deficiency, unspecified: Secondary | ICD-10-CM | POA: Diagnosis present

## 2015-11-04 DIAGNOSIS — S72002S Fracture of unspecified part of neck of left femur, sequela: Secondary | ICD-10-CM | POA: Diagnosis not present

## 2015-11-04 DIAGNOSIS — Z91013 Allergy to seafood: Secondary | ICD-10-CM | POA: Diagnosis not present

## 2015-11-04 DIAGNOSIS — J479 Bronchiectasis, uncomplicated: Secondary | ICD-10-CM

## 2015-11-04 DIAGNOSIS — Y92122 Bedroom in nursing home as the place of occurrence of the external cause: Secondary | ICD-10-CM | POA: Diagnosis not present

## 2015-11-04 DIAGNOSIS — T50905A Adverse effect of unspecified drugs, medicaments and biological substances, initial encounter: Secondary | ICD-10-CM | POA: Diagnosis present

## 2015-11-04 DIAGNOSIS — Z85038 Personal history of other malignant neoplasm of large intestine: Secondary | ICD-10-CM

## 2015-11-04 DIAGNOSIS — M25552 Pain in left hip: Secondary | ICD-10-CM | POA: Diagnosis present

## 2015-11-04 DIAGNOSIS — Z8249 Family history of ischemic heart disease and other diseases of the circulatory system: Secondary | ICD-10-CM

## 2015-11-04 DIAGNOSIS — Z91041 Radiographic dye allergy status: Secondary | ICD-10-CM

## 2015-11-04 DIAGNOSIS — S72002A Fracture of unspecified part of neck of left femur, initial encounter for closed fracture: Principal | ICD-10-CM | POA: Diagnosis present

## 2015-11-04 DIAGNOSIS — R1311 Dysphagia, oral phase: Secondary | ICD-10-CM | POA: Diagnosis not present

## 2015-11-04 DIAGNOSIS — J470 Bronchiectasis with acute lower respiratory infection: Secondary | ICD-10-CM | POA: Diagnosis not present

## 2015-11-04 DIAGNOSIS — W06XXXA Fall from bed, initial encounter: Secondary | ICD-10-CM | POA: Diagnosis present

## 2015-11-04 DIAGNOSIS — M889 Osteitis deformans of unspecified bone: Secondary | ICD-10-CM | POA: Diagnosis present

## 2015-11-04 DIAGNOSIS — G20A1 Parkinson's disease without dyskinesia, without mention of fluctuations: Secondary | ICD-10-CM | POA: Diagnosis present

## 2015-11-04 DIAGNOSIS — Z79899 Other long term (current) drug therapy: Secondary | ICD-10-CM | POA: Diagnosis not present

## 2015-11-04 DIAGNOSIS — M810 Age-related osteoporosis without current pathological fracture: Secondary | ICD-10-CM | POA: Diagnosis not present

## 2015-11-04 DIAGNOSIS — J690 Pneumonitis due to inhalation of food and vomit: Secondary | ICD-10-CM | POA: Diagnosis not present

## 2015-11-04 DIAGNOSIS — Z419 Encounter for procedure for purposes other than remedying health state, unspecified: Secondary | ICD-10-CM

## 2015-11-04 DIAGNOSIS — R062 Wheezing: Secondary | ICD-10-CM

## 2015-11-04 DIAGNOSIS — Z820 Family history of epilepsy and other diseases of the nervous system: Secondary | ICD-10-CM | POA: Diagnosis not present

## 2015-11-04 DIAGNOSIS — R1313 Dysphagia, pharyngeal phase: Secondary | ICD-10-CM | POA: Diagnosis not present

## 2015-11-04 DIAGNOSIS — S72145A Nondisplaced intertrochanteric fracture of left femur, initial encounter for closed fracture: Secondary | ICD-10-CM

## 2015-11-04 HISTORY — DX: Disorder of kidney and ureter, unspecified: N28.9

## 2015-11-04 HISTORY — PX: HIP ARTHROPLASTY: SHX981

## 2015-11-04 HISTORY — DX: Osteitis deformans of unspecified bone: M88.9

## 2015-11-04 HISTORY — DX: Fracture of unspecified part of neck of left femur, initial encounter for closed fracture: S72.002A

## 2015-11-04 HISTORY — PX: HIP PINNING,CANNULATED: SHX1758

## 2015-11-04 HISTORY — DX: Unspecified fracture of unspecified femur, initial encounter for closed fracture: S72.90XA

## 2015-11-04 LAB — CBC WITH DIFFERENTIAL/PLATELET
BASOS ABS: 0 10*3/uL (ref 0.0–0.1)
Basophils Relative: 0 %
Eosinophils Absolute: 0.1 10*3/uL (ref 0.0–0.7)
Eosinophils Relative: 1 %
HEMATOCRIT: 37.6 % (ref 36.0–46.0)
HEMOGLOBIN: 12.6 g/dL (ref 12.0–15.0)
LYMPHS PCT: 16 %
Lymphs Abs: 1.2 10*3/uL (ref 0.7–4.0)
MCH: 30.2 pg (ref 26.0–34.0)
MCHC: 33.5 g/dL (ref 30.0–36.0)
MCV: 90.2 fL (ref 78.0–100.0)
MONO ABS: 0.6 10*3/uL (ref 0.1–1.0)
Monocytes Relative: 8 %
NEUTROS ABS: 5.3 10*3/uL (ref 1.7–7.7)
NEUTROS PCT: 75 %
Platelets: 309 10*3/uL (ref 150–400)
RBC: 4.17 MIL/uL (ref 3.87–5.11)
RDW: 14.6 % (ref 11.5–15.5)
WBC: 7.1 10*3/uL (ref 4.0–10.5)

## 2015-11-04 LAB — PROTIME-INR
INR: 1.15 (ref 0.00–1.49)
Prothrombin Time: 14.5 seconds (ref 11.6–15.2)

## 2015-11-04 LAB — CREATININE, SERUM
CREATININE: 0.92 mg/dL (ref 0.44–1.00)
GFR calc Af Amer: >60 mL/min (ref >60)
GFR calc non Af Amer: 54 mL/min — ABNORMAL LOW (ref >60)

## 2015-11-04 LAB — CBC
HCT: 33.9 % — ABNORMAL LOW (ref 36.0–46.0)
Hemoglobin: 10.8 g/dL — ABNORMAL LOW (ref 12.0–15.0)
MCH: 29.3 pg (ref 26.0–34.0)
MCHC: 31.9 g/dL (ref 30.0–36.0)
MCV: 91.9 fL (ref 78.0–100.0)
PLATELETS: 278 10*3/uL (ref 150–400)
RBC: 3.69 MIL/uL — AB (ref 3.87–5.11)
RDW: 14.7 % (ref 11.5–15.5)
WBC: 8.8 10*3/uL (ref 4.0–10.5)

## 2015-11-04 LAB — BASIC METABOLIC PANEL
ANION GAP: 7 (ref 5–15)
BUN: 20 mg/dL (ref 6–20)
CHLORIDE: 104 mmol/L (ref 101–111)
CO2: 28 mmol/L (ref 22–32)
Calcium: 9.3 mg/dL (ref 8.9–10.3)
Creatinine, Ser: 0.89 mg/dL (ref 0.44–1.00)
GFR calc non Af Amer: 56 mL/min — ABNORMAL LOW (ref >60)
GLUCOSE: 99 mg/dL (ref 65–99)
POTASSIUM: 3.8 mmol/L (ref 3.5–5.1)
Sodium: 139 mmol/L (ref 135–145)

## 2015-11-04 LAB — TYPE AND SCREEN
ABO/RH(D): A NEG
Antibody Screen: POSITIVE
DAT, IgG: NEGATIVE

## 2015-11-04 LAB — SURGICAL PCR SCREEN
MRSA, PCR: NEGATIVE
STAPHYLOCOCCUS AUREUS: NEGATIVE

## 2015-11-04 LAB — APTT: APTT: 31 s (ref 24–37)

## 2015-11-04 SURGERY — FIXATION, FEMUR, NECK, PERCUTANEOUS, USING SCREW
Anesthesia: General | Site: Leg Upper | Laterality: Left

## 2015-11-04 MED ORDER — VITAMIN D 1000 UNITS PO TABS: 3000.0000 [IU] | ORAL_TABLET | Freq: Every day | ORAL | Status: DC

## 2015-11-04 MED ORDER — ONDANSETRON HCL 4 MG/2ML IJ SOLN
INTRAMUSCULAR | Status: AC
  Filled 2015-11-04: qty 2

## 2015-11-04 MED ORDER — BUDESONIDE 0.25 MG/2ML IN SUSP: 0.2500 mg | Freq: Two times a day (BID) | RESPIRATORY_TRACT | Status: DC

## 2015-11-04 MED ORDER — ASPIRIN EC 325 MG PO TBEC
325.0000 mg | DELAYED_RELEASE_TABLET | Freq: Two times a day (BID) | ORAL | Status: DC
  Administered 2015-11-04: 325 mg via ORAL
  Filled 2015-11-04 (×2): qty 1

## 2015-11-04 MED ORDER — METOCLOPRAMIDE HCL 5 MG PO TABS: 5.0000 mg | ORAL_TABLET | Freq: Three times a day (TID) | ORAL | Status: DC | PRN

## 2015-11-04 MED ORDER — SUGAMMADEX SODIUM 200 MG/2ML IV SOLN
INTRAVENOUS | Status: DC | PRN
  Administered 2015-11-04: 80 mg via INTRAVENOUS

## 2015-11-04 MED ORDER — MORPHINE SULFATE (PF) 2 MG/ML IV SOLN
1.0000 mg | INTRAVENOUS | Status: DC | PRN
Start: 2015-11-04 — End: 2015-11-04
  Administered 2015-11-04: 1 mg via INTRAVENOUS
  Filled 2015-11-04: qty 1

## 2015-11-04 MED ORDER — FAMOTIDINE 20 MG PO TABS
20.0000 mg | ORAL_TABLET | Freq: Two times a day (BID) | ORAL | Status: DC
  Administered 2015-11-04: 20 mg via ORAL
  Filled 2015-11-04 (×2): qty 1

## 2015-11-04 MED ORDER — RISAQUAD PO CAPS
1.0000 | ORAL_CAPSULE | Freq: Every day | ORAL | Status: DC
  Administered 2015-11-08: 1 via ORAL
  Filled 2015-11-04 (×5): qty 1

## 2015-11-04 MED ORDER — MENTHOL 3 MG MT LOZG: 1.0000 | LOZENGE | OROMUCOSAL | Status: DC | PRN

## 2015-11-04 MED ORDER — LEVOTHYROXINE SODIUM 25 MCG PO TABS
25.0000 ug | ORAL_TABLET | Freq: Every day | ORAL | Status: DC
  Administered 2015-11-05 – 2015-11-08 (×2): 25 ug via ORAL
  Filled 2015-11-04 (×4): qty 1

## 2015-11-04 MED ORDER — CARBIDOPA-LEVODOPA ER 48.75-195 MG PO CPCR: 1.0000 | ORAL_CAPSULE | Freq: Three times a day (TID) | ORAL | Status: DC

## 2015-11-04 MED ORDER — FENTANYL CITRATE (PF) 250 MCG/5ML IJ SOLN
INTRAMUSCULAR | Status: AC
  Filled 2015-11-04: qty 5

## 2015-11-04 MED ORDER — SUGAMMADEX SODIUM 200 MG/2ML IV SOLN
INTRAVENOUS | Status: AC
  Filled 2015-11-04: qty 2

## 2015-11-04 MED ORDER — PROPOFOL 10 MG/ML IV BOLUS
INTRAVENOUS | Status: DC | PRN
  Administered 2015-11-04: 40 mg via INTRAVENOUS

## 2015-11-04 MED ORDER — ROTIGOTINE 4 MG/24HR TD PT24
1.0000 | MEDICATED_PATCH | TRANSDERMAL | Status: DC
  Administered 2015-11-05 – 2015-11-07 (×3): 1 via TRANSDERMAL

## 2015-11-04 MED ORDER — FENTANYL CITRATE (PF) 250 MCG/5ML IJ SOLN
INTRAMUSCULAR | Status: DC | PRN
  Administered 2015-11-04: 50 ug via INTRAVENOUS

## 2015-11-04 MED ORDER — ACETAMINOPHEN 325 MG PO TABS: 650.0000 mg | ORAL_TABLET | Freq: Once | ORAL | Status: DC

## 2015-11-04 MED ORDER — CEFAZOLIN SODIUM-DEXTROSE 2-4 GM/100ML-% IV SOLN: 2.0000 g | INTRAVENOUS | Status: DC

## 2015-11-04 MED ORDER — ENOXAPARIN SODIUM 40 MG/0.4ML ~~LOC~~ SOLN
40.0000 mg | SUBCUTANEOUS | Status: DC
  Administered 2015-11-04 – 2015-11-07 (×4): 40 mg via SUBCUTANEOUS
  Filled 2015-11-04 (×4): qty 0.4

## 2015-11-04 MED ORDER — PHENOL 1.4 % MT LIQD: 1.0000 | OROMUCOSAL | Status: DC | PRN

## 2015-11-04 MED ORDER — LIDOCAINE HCL (CARDIAC) 20 MG/ML IV SOLN
INTRAVENOUS | Status: DC | PRN
  Administered 2015-11-04: 40 mg via INTRAVENOUS

## 2015-11-04 MED ORDER — CARBIDOPA-LEVODOPA ER 50-200 MG PO TBCR
1.0000 | EXTENDED_RELEASE_TABLET | Freq: Three times a day (TID) | ORAL | Status: DC
  Administered 2015-11-04: 1 via ORAL
  Filled 2015-11-04 (×6): qty 1

## 2015-11-04 MED ORDER — CEFAZOLIN SODIUM 1 G IJ SOLR
INTRAMUSCULAR | Status: AC
  Filled 2015-11-04: qty 20

## 2015-11-04 MED ORDER — ONDANSETRON HCL 4 MG PO TABS: 4.0000 mg | ORAL_TABLET | Freq: Four times a day (QID) | ORAL | Status: DC | PRN

## 2015-11-04 MED ORDER — ONDANSETRON HCL 4 MG/2ML IJ SOLN
INTRAMUSCULAR | Status: DC | PRN
  Administered 2015-11-04: 4 mg via INTRAVENOUS

## 2015-11-04 MED ORDER — LACTATED RINGERS IV SOLN
INTRAVENOUS | Status: DC
  Administered 2015-11-04: 16:00:00 via INTRAVENOUS

## 2015-11-04 MED ORDER — RASAGILINE MESYLATE 1 MG PO TABS: 1.0000 mg | ORAL_TABLET | Freq: Every day | ORAL | Status: DC

## 2015-11-04 MED ORDER — SENNOSIDES-DOCUSATE SODIUM 8.6-50 MG PO TABS: 1.0000 | ORAL_TABLET | Freq: Every evening | ORAL | Status: DC | PRN

## 2015-11-04 MED ORDER — FENTANYL CITRATE (PF) 100 MCG/2ML IJ SOLN: 25.0000 ug | INTRAMUSCULAR | Status: DC | PRN

## 2015-11-04 MED ORDER — SODIUM CHLORIDE 0.9 % IR SOLN
Status: DC | PRN
  Administered 2015-11-04: 1000 mL

## 2015-11-04 MED ORDER — IPRATROPIUM-ALBUTEROL 0.5-2.5 (3) MG/3ML IN SOLN
3.0000 mL | Freq: Once | RESPIRATORY_TRACT | Status: AC
  Administered 2015-11-04: 3 mL via RESPIRATORY_TRACT
  Filled 2015-11-04: qty 3

## 2015-11-04 MED ORDER — ACETAMINOPHEN 325 MG PO TABS
650.0000 mg | ORAL_TABLET | Freq: Four times a day (QID) | ORAL | Status: DC | PRN
  Administered 2015-11-08 (×2): 650 mg via ORAL
  Filled 2015-11-04 (×2): qty 2

## 2015-11-04 MED ORDER — ENSURE ENLIVE PO LIQD: 237.0000 mL | Freq: Every day | ORAL | Status: DC

## 2015-11-04 MED ORDER — ACETAMINOPHEN 650 MG RE SUPP
650.0000 mg | Freq: Four times a day (QID) | RECTAL | Status: DC | PRN
  Administered 2015-11-06: 650 mg via RECTAL
  Filled 2015-11-04 (×2): qty 1

## 2015-11-04 MED ORDER — ALBUTEROL SULFATE (2.5 MG/3ML) 0.083% IN NEBU
2.5000 mg | INHALATION_SOLUTION | Freq: Two times a day (BID) | RESPIRATORY_TRACT | Status: DC
  Administered 2015-11-04 – 2015-11-08 (×8): 2.5 mg via RESPIRATORY_TRACT
  Filled 2015-11-04 (×8): qty 3

## 2015-11-04 MED ORDER — MORPHINE SULFATE (PF) 2 MG/ML IV SOLN: 0.5000 mg | INTRAVENOUS | Status: DC | PRN

## 2015-11-04 MED ORDER — FLUTICASONE PROPIONATE 50 MCG/ACT NA SUSP
1.0000 | Freq: Every day | NASAL | Status: DC
  Filled 2015-11-04: qty 16

## 2015-11-04 MED ORDER — BUDESONIDE 0.25 MG/2ML IN SUSP
0.2500 mg | Freq: Every day | RESPIRATORY_TRACT | Status: DC
  Administered 2015-11-05 – 2015-11-08 (×4): 0.25 mg via RESPIRATORY_TRACT
  Filled 2015-11-04 (×4): qty 2

## 2015-11-04 MED ORDER — ONDANSETRON HCL 4 MG/2ML IJ SOLN: 4.0000 mg | Freq: Four times a day (QID) | INTRAMUSCULAR | Status: DC | PRN

## 2015-11-04 MED ORDER — CHLORHEXIDINE GLUCONATE 4 % EX LIQD
60.0000 mL | Freq: Once | CUTANEOUS | Status: DC
  Filled 2015-11-04: qty 60

## 2015-11-04 MED ORDER — CEFAZOLIN SODIUM-DEXTROSE 2-4 GM/100ML-% IV SOLN
2.0000 g | Freq: Four times a day (QID) | INTRAVENOUS | Status: AC
  Administered 2015-11-04 – 2015-11-05 (×2): 2 g via INTRAVENOUS
  Filled 2015-11-04 (×3): qty 100

## 2015-11-04 MED ORDER — CEFAZOLIN SODIUM-DEXTROSE 2-3 GM-% IV SOLR
2.0000 g | Freq: Once | INTRAVENOUS | Status: AC
  Administered 2015-11-04: 2 g via INTRAVENOUS

## 2015-11-04 MED ORDER — METOCLOPRAMIDE HCL 5 MG/ML IJ SOLN: 5.0000 mg | Freq: Three times a day (TID) | INTRAMUSCULAR | Status: DC | PRN

## 2015-11-04 MED ORDER — ROCURONIUM BROMIDE 100 MG/10ML IV SOLN
INTRAVENOUS | Status: DC | PRN
  Administered 2015-11-04: 20 mg via INTRAVENOUS
  Administered 2015-11-04: 10 mg via INTRAVENOUS

## 2015-11-04 MED ORDER — HYDROCODONE-ACETAMINOPHEN 5-325 MG PO TABS
1.0000 | ORAL_TABLET | Freq: Four times a day (QID) | ORAL | Status: DC | PRN
  Administered 2015-11-04: 1 via ORAL
  Filled 2015-11-04 (×2): qty 1

## 2015-11-04 SURGICAL SUPPLY — 73 items
BIT DRILL CANNULATED 5.0 (BIT) ×3 IMPLANT
BLADE SAW SAG 73X25 THK (BLADE) ×2
BLADE SAW SGTL 73X25 THK (BLADE) ×1 IMPLANT
BLADE SURG 10 STRL SS (BLADE) ×3 IMPLANT
BLADE SURG ROTATE 9660 (MISCELLANEOUS) IMPLANT
BNDG COHESIVE 4X5 TAN STRL (GAUZE/BANDAGES/DRESSINGS) ×3 IMPLANT
BNDG GAUZE ELAST 4 BULKY (GAUZE/BANDAGES/DRESSINGS) ×3 IMPLANT
CHLORAPREP W/TINT 26ML (MISCELLANEOUS) ×3 IMPLANT
COVER MAYO STAND STRL (DRAPES) ×3 IMPLANT
COVER PERINEAL POST (MISCELLANEOUS) ×3 IMPLANT
COVER SURGICAL LIGHT HANDLE (MISCELLANEOUS) ×6 IMPLANT
DRAPE IMP U-DRAPE 54X76 (DRAPES) ×3 IMPLANT
DRAPE INCISE IOBAN 66X45 STRL (DRAPES) IMPLANT
DRAPE ORTHO SPLIT 77X108 STRL (DRAPES)
DRAPE PROXIMA HALF (DRAPES) ×3 IMPLANT
DRAPE STERI IOBAN 125X83 (DRAPES) IMPLANT
DRAPE SURG 17X23 STRL (DRAPES) IMPLANT
DRAPE SURG ISO 125X83 STRL (DRAPES) ×3 IMPLANT
DRAPE SURG ORHT 6 SPLT 77X108 (DRAPES) IMPLANT
DRAPE U-SHAPE 47X51 STRL (DRAPES) ×3 IMPLANT
DRILL BIT 7/64X5 (BIT) ×3 IMPLANT
DRSG MEPILEX BORDER 4X4 (GAUZE/BANDAGES/DRESSINGS) ×3 IMPLANT
DRSG PAD ABDOMINAL 8X10 ST (GAUZE/BANDAGES/DRESSINGS) IMPLANT
ELECT BLADE 6.5 EXT (BLADE) IMPLANT
ELECT REM PT RETURN 9FT ADLT (ELECTROSURGICAL) ×3
ELECTRODE REM PT RTRN 9FT ADLT (ELECTROSURGICAL) ×1 IMPLANT
EVACUATOR 1/8 PVC DRAIN (DRAIN) IMPLANT
GLOVE BIO SURGEON STRL SZ7 (GLOVE) ×3 IMPLANT
GLOVE BIO SURGEON STRL SZ7.5 (GLOVE) ×3 IMPLANT
GLOVE BIOGEL PI IND STRL 8 (GLOVE) ×1 IMPLANT
GLOVE BIOGEL PI INDICATOR 8 (GLOVE) ×2
GLOVE SS BIOGEL STRL SZ 8 (GLOVE) ×1 IMPLANT
GLOVE SUPERSENSE BIOGEL SZ 8 (GLOVE) ×2
GOWN STRL REUS W/ TWL LRG LVL3 (GOWN DISPOSABLE) ×3 IMPLANT
GOWN STRL REUS W/ TWL XL LVL3 (GOWN DISPOSABLE) ×2 IMPLANT
GOWN STRL REUS W/TWL LRG LVL3 (GOWN DISPOSABLE) ×6
GOWN STRL REUS W/TWL XL LVL3 (GOWN DISPOSABLE) ×4
GUIDEWIRE BALL NOSE 80CM (WIRE) IMPLANT
GUIDEWIRE THREAD TIP 3.2X9 (WIRE) ×9 IMPLANT
HANDPIECE INTERPULSE COAX TIP (DISPOSABLE)
HOOD PEEL AWAY FACE SHEILD DIS (HOOD) ×6 IMPLANT
IMMOBILIZER KNEE 22 UNIV (SOFTGOODS) IMPLANT
KIT BASIN OR (CUSTOM PROCEDURE TRAY) ×3 IMPLANT
KIT ROOM TURNOVER OR (KITS) ×3 IMPLANT
LINER BOOT UNIVERSAL DISP (MISCELLANEOUS) ×3 IMPLANT
MANIFOLD NEPTUNE II (INSTRUMENTS) IMPLANT
NEEDLE MAYO TROCAR (NEEDLE) ×3 IMPLANT
NS IRRIG 1000ML POUR BTL (IV SOLUTION) ×3 IMPLANT
PACK GENERAL/GYN (CUSTOM PROCEDURE TRAY) ×3 IMPLANT
PACK TOTAL JOINT (CUSTOM PROCEDURE TRAY) ×3 IMPLANT
PACK UNIVERSAL I (CUSTOM PROCEDURE TRAY) ×3 IMPLANT
PAD ARMBOARD 7.5X6 YLW CONV (MISCELLANEOUS) ×6 IMPLANT
PAD CAST 4YDX4 CTTN HI CHSV (CAST SUPPLIES) IMPLANT
PADDING CAST COTTON 4X4 STRL (CAST SUPPLIES)
PASSER SUT SWANSON 36MM LOOP (INSTRUMENTS) IMPLANT
PRESSURIZER FEMORAL UNIV (MISCELLANEOUS) IMPLANT
SCREW CANNULATED 6.5X16X70 (Screw) ×3 IMPLANT
SCREW CANNULATED 6.5X16X775 (Screw) ×6 IMPLANT
SET HNDPC FAN SPRY TIP SCT (DISPOSABLE) IMPLANT
STAPLER VISISTAT 35W (STAPLE) ×3 IMPLANT
SUT ETHIBOND NAB CT1 #1 30IN (SUTURE) IMPLANT
SUT VIC AB 0 CT1 27 (SUTURE)
SUT VIC AB 0 CT1 27XBRD ANBCTR (SUTURE) IMPLANT
SUT VIC AB 1 CT1 27 (SUTURE)
SUT VIC AB 1 CT1 27XBRD ANBCTR (SUTURE) IMPLANT
SUT VIC AB 1 CTB1 27 (SUTURE) IMPLANT
SUT VIC AB 2-0 CT1 27 (SUTURE) ×2
SUT VIC AB 2-0 CT1 TAPERPNT 27 (SUTURE) ×1 IMPLANT
TOWEL OR 17X24 6PK STRL BLUE (TOWEL DISPOSABLE) ×3 IMPLANT
TOWEL OR 17X26 10 PK STRL BLUE (TOWEL DISPOSABLE) ×3 IMPLANT
TOWER CARTRIDGE SMART MIX (DISPOSABLE) IMPLANT
TRAY FOLEY CATH 16FRSI W/METER (SET/KITS/TRAYS/PACK) ×3 IMPLANT
WATER STERILE IRR 1000ML POUR (IV SOLUTION) IMPLANT

## 2015-11-04 NOTE — Anesthesia Procedure Notes (Signed)
Procedure Name: Intubation Date/Time: 11/04/2015 4:13 PM Performed by: Sampson Si E Pre-anesthesia Checklist: Patient identified, Emergency Drugs available, Suction available, Patient being monitored and Timeout performed Patient Re-evaluated:Patient Re-evaluated prior to inductionOxygen Delivery Method: Circle system utilized Preoxygenation: Pre-oxygenation with 100% oxygen Intubation Type: IV induction Ventilation: Mask ventilation without difficulty Laryngoscope Size: Mac and 3 Grade View: Grade I Tube type: Oral Tube size: 7.0 mm Number of attempts: 1 Airway Equipment and Method: Stylet Placement Confirmation: ETT inserted through vocal cords under direct vision,  positive ETCO2 and breath sounds checked- equal and bilateral Secured at: 20 cm Tube secured with: Tape Dental Injury: Teeth and Oropharynx as per pre-operative assessment

## 2015-11-04 NOTE — Consult Note (Signed)
Reason for Consult: L hip fracture Referring Physician: Angeliyah Kirkey is an 82 y.o. female.  HPI: S/p fall out of bed this am. C/o L hip pain.  Found in ED to have femoral neck fracture.  Denies other pain/injury.  Daughter is at bedside.  Past Medical History  Diagnosis Date  . Colon cancer ([place])   . Parkinson's disease ([place])   . Osteoporosis   . Vitamin D deficiency   . Dementia   . Ventral hernia   . Pernicious anemia   . COPD (chronic obstructive pulmonary disease) ([place])   . 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 Renal disorder     Renal insufficiency  . Paget's bone disease     Past Surgical History  Procedure Laterality Date  . Colon surgery      bowel resection  . Breast surgery Left     Family History  Problem Relation Age of Onset  . Heart disease Mother   . Alzheimer's disease Father     Social History:  reports that she has never smoked. She has never used smokeless tobacco. She reports that she drinks alcohol. She reports that she does not use illicit drugs.  Allergies:  Allergies  Allergen Reactions  . Zithromax [Azithromycin] Diarrhea  . Iodine Other (See Comments)    Unknown, might be rash/hives  . Ivp Dye [Iodinated Diagnostic Agents] Other (See Comments)    Unknown rash/hives maybe  . Shellfish Allergy Other (See Comments)    Unknown rash/hives, scallops     Medications: I have reviewed the patient's current medications.  Results for orders placed or performed during the hospital encounter of 11/04/15 (from the past 48 hour(s))  CBC with Differential     Status: None   Collection Time: 11/04/15  7:58 AM  Result Value Ref Range   WBC 7.1 4.0 - 10.5 K/uL   RBC 4.17 3.87 - 5.11 MIL/uL   Hemoglobin 12.6 12.0 - 15.0 g/dL   HCT 37.6 36.0 - 46.0 %   MCV 90.2 78.0 - 100.0 fL   MCH 30.2 26.0 - 34.0 pg   MCHC 33.5 30.0 - 36.0 g/dL   RDW 14.6 11.5 - 15.5 %   Platelets 309 150 - 400 K/uL   Neutrophils Relative % 75 %   Neutro Abs 5.3 1.7 - 7.7  K/uL   Lymphocytes Relative 16 %   Lymphs Abs 1.2 0.7 - 4.0 K/uL   Monocytes Relative 8 %   Monocytes Absolute 0.6 0.1 - 1.0 K/uL   Eosinophils Relative 1 %   Eosinophils Absolute 0.1 0.0 - 0.7 K/uL   Basophils Relative 0 %   Basophils Absolute 0.0 0.0 - 0.1 K/uL  Basic metabolic panel     Status: Abnormal   Collection Time: 11/04/15  7:58 AM  Result Value Ref Range   Sodium 139 135 - 145 mmol/L   Potassium 3.8 3.5 - 5.1 mmol/L   Chloride 104 101 - 111 mmol/L   CO2 28 22 - 32 mmol/L   Glucose, Bld 99 65 - 99 mg/dL   BUN 20 6 - 20 mg/dL   Creatinine, Ser 0.89 0.44 - 1.00 mg/dL   Calcium 9.3 8.9 - 10.3 mg/dL   GFR calc non Af Amer 56 (L) >60 mL/min   GFR calc Af Amer >60 >60 mL/min    Comment: (NOTE) The eGFR has been calculated using the CKD EPI equation. This calculation has not been validated in all clinical situations.  eGFR's persistently <60 mL/min signify possible Chronic Kidney Disease.    Anion gap 7 5 - 15  Protime-INR     Status: None   Collection Time: 11/04/15  7:58 AM  Result Value Ref Range   Prothrombin Time 14.5 11.6 - 15.2 seconds   INR 1.15 0.00 - 1.49  APTT     Status: None   Collection Time: 11/04/15  7:58 AM  Result Value Ref Range   aPTT 31 24 - 37 seconds  Type and screen [place]     Status: None   Collection Time: 11/04/15  7:58 AM  Result Value Ref Range   ABO/RH(D) A NEG    Antibody Screen POS    Sample Expiration 11/07/2015    Antibody Identification ANTI D    DAT, IgG NEG   CBC     Status: Abnormal   Collection Time: 11/04/15  2:04 PM  Result Value Ref Range   WBC 8.8 4.0 - 10.5 K/uL   RBC 3.69 (L) 3.87 - 5.11 MIL/uL   Hemoglobin 10.8 (L) 12.0 - 15.0 g/dL   HCT 33.9 (L) 36.0 - 46.0 %   MCV 91.9 78.0 - 100.0 fL   MCH 29.3 26.0 - 34.0 pg   MCHC 31.9 30.0 - 36.0 g/dL   RDW 14.7 11.5 - 15.5 %   Platelets 278 150 - 400 K/uL  Creatinine, serum     Status: Abnormal   Collection Time: 11/04/15  2:04 PM  Result Value  Ref Range   Creatinine, Ser 0.92 0.44 - 1.00 mg/dL   GFR calc non Af Amer 54 (L) >60 mL/min   GFR calc Af Amer >60 >60 mL/min    Comment: (NOTE) The eGFR has been calculated using the CKD EPI equation. This calculation has not been validated in all clinical situations. eGFR's persistently <60 mL/min signify possible Chronic Kidney Disease.     Dg Chest 2 View  11/04/2015  CLINICAL DATA:  Preop.  Hip fracture. EXAM: CHEST  2 VIEW COMPARISON:  08/13/2015 FINDINGS: Coarse parenchymal changes in the left lower lung zone are stable. Mild patchy density has developed at the right lung base likely related to volume loss. Lungs remain hyperaerated. No pneumothorax. Chronic appearing deformity of the right humeral head is noted. IMPRESSION: Chronic changes in the left lung. Atelectasis at the right base. Electronically Signed   By: Marybelle Killings M.D.   On: 11/04/2015 08:07   Ct Head Wo Contrast  11/04/2015  CLINICAL DATA:  Fall this morning, Per Dr. Keturah Shavers notes: History of Parkinson's disease, dementia, COPD, She presents from [facility] screen this morning after being found on the floor at around 6 AM. According to staff they had seen her in bed at 5 a.m. EXAM: CT HEAD WITHOUT CONTRAST CT CERVICAL SPINE WITHOUT CONTRAST TECHNIQUE: Multidetector CT imaging of the head and cervical spine was performed following the standard protocol without intravenous contrast. Multiplanar CT image reconstructions of the cervical spine were also generated. COMPARISON:  Head CT 04/09/2015 FINDINGS: CT HEAD FINDINGS No intracranial hemorrhage. No parenchymal contusion. No midline shift or mass effect. Basilar cisterns are patent. No skull base fracture. No fluid in the paranasal sinuses or mastoid air cells. Orbits are normal. There are periventricular and subcortical white matter hypodensities. Generalized cortical atrophy. CT CERVICAL SPINE FINDINGS No prevertebral soft tissue swelling. Normal alignment of cervical vertebral  bodies. No loss of vertebral body height. Normal facet articulation. Normal craniocervical junction. Mild joint space narrowing in the lower  cervical spine with endplate sclerosis No evidence epidural or paraspinal hematoma. There is fine airspace opacities in the RIGHT upper lobe. IMPRESSION: 1. No intracranial trauma. 2. No cervical spine fracture. 3. Fine airspace disease in the RIGHT upper lobe with differential including mild edema versus less likely infection. Electronically Signed   By: Suzy Bouchard M.D.   On: 11/04/2015 08:12   Ct Cervical Spine Wo Contrast  11/04/2015  CLINICAL DATA:  Fall this morning, Per Dr. Keturah Shavers notes: History of Parkinson's disease, dementia, COPD, She presents from [facility] screen this morning after being found on the floor at around 6 AM. According to staff they had seen her in bed at 5 a.m. EXAM: CT HEAD WITHOUT CONTRAST CT CERVICAL SPINE WITHOUT CONTRAST TECHNIQUE: Multidetector CT imaging of the head and cervical spine was performed following the standard protocol without intravenous contrast. Multiplanar CT image reconstructions of the cervical spine were also generated. COMPARISON:  Head CT 04/09/2015 FINDINGS: CT HEAD FINDINGS No intracranial hemorrhage. No parenchymal contusion. No midline shift or mass effect. Basilar cisterns are patent. No skull base fracture. No fluid in the paranasal sinuses or mastoid air cells. Orbits are normal. There are periventricular and subcortical white matter hypodensities. Generalized cortical atrophy. CT CERVICAL SPINE FINDINGS No prevertebral soft tissue swelling. Normal alignment of cervical vertebral bodies. No loss of vertebral body height. Normal facet articulation. Normal craniocervical junction. Mild joint space narrowing in the lower cervical spine with endplate sclerosis No evidence epidural or paraspinal hematoma. There is fine airspace opacities in the RIGHT upper lobe. IMPRESSION: 1. No intracranial trauma. 2. No cervical  spine fracture. 3. Fine airspace disease in the RIGHT upper lobe with differential including mild edema versus less likely infection. Electronically Signed   By: Suzy Bouchard M.D.   On: 11/04/2015 08:12   Dg Hip Unilat With Pelvis 2-3 Views Left  11/04/2015  CLINICAL DATA:  Fall at nursing home today with left hip pain EXAM: DG HIP (WITH OR WITHOUT PELVIS) 2-3V LEFT COMPARISON:  None. FINDINGS: Mildly to moderately impacted fracture left femoral neck. Pelvic bones intact. IMPRESSION: Acute left femur fracture Electronically Signed   By: Skipper Cliche M.D.   On: 11/04/2015 07:22    Review of Systems  All other systems reviewed and are negative.  Blood pressure 130/53, pulse 103, temperature 98.7 F (37.1 C), temperature source Oral, resp. rate 20, SpO2 93 %. Physical Exam  HENT:  Head: Atraumatic.  Eyes: EOM are normal.  Cardiovascular: Intact distal pulses.   Respiratory: Effort normal.  Musculoskeletal:  L hip pain with ROM and TTP. Distally NVID  Neurological: She is alert.  Skin: Skin is warm and dry.  Psychiatric: She has a normal mood and affect.    Assessment/Plan: L valgus impacted femoral neck fracture Plan CRPP femoral neck fracture Risks / benefits of surgery discussed Consent on chart  NPO for OR Preop antibiotics Admit to medicine svc.  Nita Sells 11/04/2015, 3:54 PM

## 2015-11-04 NOTE — ED Notes (Signed)
Pt arrives to the ER via EMS from [facility]; pt was found in the floor around 6am after an unwitnessed fall; pt c/o left hip; tenderness to palpation; + pulse; pt with hx of dementia and unable to provide any additional information; Staf at [facility] advised that she is at her baseline mentation

## 2015-11-04 NOTE — Consult Note (Signed)
I was called in consultation regarding this patient's left hip fracture sustained after a fall out of bed. I reviewed her x-rays. I feel she would likely do well with closed reduction percutaneous screw fixation of this impacted fracture. It is possible that if there is further displacement at the time of surgery she may be indicated for hemiarthroplasty instead. She should be transferred to [facility] where I will meet her in the holding area and discuss the surgical treatment further with her and her family. A more complete consult note will be completed at that time. She will be admitted to the hospitalist service.

## 2015-11-04 NOTE — ED Notes (Signed)
Unable to collect labs at this time PT in xray

## 2015-11-04 NOTE — Op Note (Signed)
11/04/2015  4:47 PM  PATIENT:  Shannon Dunn    PRE-OPERATIVE DIAGNOSIS:  LEFT FEMORAL NECK FX  POST-OPERATIVE DIAGNOSIS:  Same  PROCEDURE:  CANNULATED HIP SCREW FIXATION,  SURGEON:  Nita Sells, MD  PHYSICIAN ASSISTANT: none  ANESTHESIA:   General  PREOPERATIVE INDICATIONS:  [name] is a  82 y.o. female who fell and was found to have a diagnosis of LEFT FEMORAL NECK FX who elected for surgical management.    The risks benefits and alternatives were discussed with the patient preoperatively including but not limited to the risks of infection, bleeding, nerve injury, cardiopulmonary complications, blood clots, malunion, nonunion, avascular necrosis, the need for revision surgery, the potential for conversion to hemiarthroplasty, among others, and the patient was willing to proceed.  OPERATIVE IMPLANTS: 6.5 mm cannulated screws x3  OPERATIVE FINDINGS: Clinical osteoporosis with weak bone, proximal femur  OPERATIVE PROCEDURE: The patient was brought to the operating room and placed in supine position. IV antibiotics were given. General anesthesia administered. Foley was also given. She was placed on the fracture table. The left lower extremity was positioned, without any significant reduction maneuver. The left lower extremity was prepped and draped in usual sterile fashion.  Time out was performed.  Small incision was made distal to the greater trochanter, and 3 guidewires were introduced Into an inverted triangle configuration. The lengths were measured. The reduction was slightly valgus. . I opened the cortex with a cannulated drill, and then placed the screws into position. Satisfactory fixation was achieved.  The wounds were irrigated copiously, and repaired with Vicryl with Steri-Strips and sterile gauze. There no complications and the patient tolerated the procedure well.  The patient will be weightbearing as tolerated, and will be on ECASA BID  for a period  of one month postoperatively.

## 2015-11-04 NOTE — Anesthesia Preprocedure Evaluation (Addendum)
Anesthesia Evaluation  Patient identified by MRN, date of birth, ID band Patient awake    Reviewed: Allergy & Precautions, H&P , NPO status , Patient's Chart, lab work & pertinent test results  Airway Mallampati: II  TM Distance: >3 FB Neck ROM: Full    Dental no notable dental hx. (+) Teeth Intact, Dental Advisory Given   Pulmonary COPD,    Pulmonary exam normal + rhonchi        Cardiovascular negative cardio ROS   Rhythm:Regular Rate:Normal     Neuro/Psych Parkinson's disease Dementia negative neurological ROS  negative psych ROS   GI/Hepatic negative GI ROS, Neg liver ROS,   Endo/Other  negative endocrine ROS  Renal/GU Renal InsufficiencyRenal disease  negative genitourinary   Musculoskeletal   Abdominal   Peds  Hematology negative hematology ROS (+) anemia ,   Anesthesia Other Findings   Reproductive/Obstetrics negative OB ROS                            Anesthesia Physical Anesthesia Plan  ASA: III  Anesthesia Plan: General   Post-op Pain Management:    Induction: Intravenous  Airway Management Planned: Oral ETT  Additional Equipment:   Intra-op Plan:   Post-operative Plan: Extubation in OR and Possible Post-op intubation/ventilation  Informed Consent: I have reviewed the patients History and Physical, chart, labs and discussed the procedure including the risks, benefits and alternatives for the proposed anesthesia with the patient or authorized representative who has indicated his/her understanding and acceptance.   Dental advisory given  Plan Discussed with: CRNA  Anesthesia Plan Comments:        Anesthesia Quick Evaluation

## 2015-11-04 NOTE — ED Notes (Signed)
Assumed care of patient from night shift as patient returned from xray.  Xray tech notified RN that patient was soiled with urine.  Patient's nightgown from home was removed, new brief placed and bed linen changed.

## 2015-11-04 NOTE — H&P (Signed)
History and Physical    Shannon Dunn [id number] DOB: 12-05-1923 DOA: 11/04/2015  Referring MD/NP/PA: Dr. Oleta Mouse PCP: Renata Caprice, DO   Patient coming from: [place]  Chief Complaint: Hip pain  HPI: Shannon Dunn is a 82 y.o. 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  Report from nursing home is that she was seen in bed at 5am, but then was found down at [place] fall.  She was complaining of hip pain in the ED and imaging revealed a hip fracture.  She has dementia, history was attempted, but difficult to obtain.  She notes that the hip "just hurts" and that the pain medication causes her to feel drowsy so she cannot focus on my questions.  She remembers falling, but thought she was out for a walk when it happened.  She denied any other symptoms including difficulty breathing, chest pain, head pain, rashes, wounds, dizziness, lightheadedness.  Family in the room notes that she is on a memory unit at this time for her AD.  She also has parkinson's which makes her very stiff.    ED Course: Imaging including head CT and neck CT were normal.  Hip xray revealed a hip fracture.  Orthopedics, Dr. Tamera Punt was consulted and recommended hospitalist admission and transfer to [facility] for surgery.   Review of Systems: As per HPI otherwise a 10 point ROS was negative.    Past Medical History  Diagnosis Date  . Colon cancer ([place])   . Parkinson's disease ([place])   . Osteoporosis   . Vitamin D deficiency   . Dementia   . Ventral hernia   . Pernicious anemia   . COPD (chronic obstructive pulmonary disease) ([place])   . Pagets disease, breast ([place])   . Renal disorder     Renal insufficiency  . 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      Past Surgical History  Procedure Laterality Date  . Colon surgery      bowel resection  .  Breast surgery Left     Reports that she has never smoked. She has never used smokeless tobacco. She reports that she does not currently drinks alcohol. She reports that she does not use illicit drugs.  Allergies  Allergen Reactions  . Zithromax [Azithromycin] Diarrhea  . Iodine Other (See Comments)    Unknown, might be rash/hives  . Ivp Dye [Iodinated Diagnostic Agents] Other (See Comments)    Unknown rash/hives maybe  . Shellfish Allergy Other (See Comments)    Unknown rash/hives, scallops     Family History  Problem Relation Age of Onset  . Heart disease Mother   . Alzheimer's disease Father     Prior to Admission medications   Medication Sig Start Date End Date Taking? Authorizing Provider  Respiratory Therapy Supplies (FLUTTER) DEVI Use at least 4 times a day 07/20/15  Yes Erick Colace, NP  acidophilus (RISAQUAD) CAPS capsule Take 1 capsule by mouth daily. 07/20/15   Erick Colace, NP  albuterol (PROVENTIL) (5 MG/ML) 0.5% nebulizer solution Take 0.5 mLs (2.5 mg total) by nebulization 2 (two) times daily. 08/16/15   Magdalen Spatz, NP  budesonide (PULMICORT) 0.25 MG/2ML nebulizer solution Take 0.25 mg by nebulization 2 (two) times daily.     Historical Provider, MD  Calcium-Vitamin D (CALTRATE 600 PLUS-VIT D PO) Take  1 tablet by mouth 2 (two) times daily.    Historical Provider, MD  Carbidopa-Levodopa ER (RYTARY) 48.75-195 MG CPCR Take 1 tablet by mouth 3 (three) times daily.    Historical Provider, MD  cholecalciferol (VITAMIN D) 1000 UNITS tablet Take 3,000 Units by mouth daily.     Historical Provider, MD  cyanocobalamin (,VITAMIN B-12,) 1000 MCG/ML injection every 30 (thirty) days.    Historical Provider, MD  denosumab (PROLIA) 60 MG/ML SOLN injection Inject 60 mg into the skin every 6 (six) months. Administer in upper arm, thigh, or abdomen    Historical Provider, MD  ENSURE PLUS (ENSURE PLUS) LIQD Take 237 mLs by mouth daily.    Historical Provider, MD  fluticasone  (FLONASE) 50 MCG/ACT nasal spray Place 1 spray into both nostrils daily.    Historical Provider, MD  GuaiFENesin (DIABETIC TUSSIN EX PO) Take 5 mLs by mouth every 6 (six) hours as needed (congestion/cough.).    Historical Provider, MD  ibuprofen (ADVIL,MOTRIN) 400 MG tablet Take 400 mg by mouth every 6 (six) hours as needed for mild pain or moderate pain. Reported on 07/30/2015    Historical Provider, MD  levothyroxine (SYNTHROID, LEVOTHROID) 25 MCG tablet Take 25 mcg by mouth daily before breakfast.    Historical Provider, MD  loperamide (IMODIUM) 2 MG capsule Take 2 mg by mouth daily after breakfast.    Historical Provider, MD  ranitidine (ZANTAC) 75 MG tablet Take 75 mg by mouth 2 (two) times daily.    Historical Provider, MD  rasagiline (AZILECT) 1 MG TABS tablet Take 1 mg by mouth daily. 1400.    Historical Provider, MD  Rivastigmine (EXELON) 13.3 MG/24HR PT24 Place 1 patch onto the skin daily.    Historical Provider, MD  rotigotine (NEUPRO) 4 MG/24HR Place 1 patch onto the skin daily.    Historical Provider, MD  Wheat Dextrin (BENEFIBER PO) Take 1 scoop by mouth daily.    Historical Provider, MD    Physical Exam: Filed Vitals:   11/04/15 0800 11/04/15 1015 11/04/15 1053 11/04/15 1145  BP: 158/86 150/74 136/75 132/75  Pulse:  97 98 100  Temp:      TempSrc:      Resp:  19 20 24   SpO2:  100% 94% 93%      Constitutional: NAD, calm, comfortable when lying still Filed Vitals:   11/04/15 0800 11/04/15 1015 11/04/15 1053 11/04/15 1145  BP: 158/86 150/74 136/75 132/75  Pulse:  97 98 100  Temp:      TempSrc:      Resp:  19 20 24   SpO2:  100% 94% 93%   Eyes: PERRL, some mild drainage of the left eye ENMT: Mucous membranes are moist. No lesions on tongue Respiratory: clear to auscultation bilaterally, no wheezing, no crackles. Normal respiratory effort.  Cardiovascular: Regular rate and normal rhythm, no murmurs / rubs / gallops. No extremity edema Abdomen: no tenderness, no  distention. Bowel sounds positive.  Musculoskeletal: no clubbing / cyanosis. She has a retracted left leg which is turned in.  Pain with manipulation.   Skin: no rashes, lesions, ulcers. Senile purpura Psychiatric: Normal judgment and insight. Alert and oriented to self and place. Some confusion about precipitating event. Normal mood.   Labs on Admission: I have personally reviewed following labs and imaging studies  CBC:  Recent Labs Lab 11/04/15 0758  WBC 7.1  NEUTROABS 5.3  HGB 12.6  HCT 37.6  MCV 90.2  PLT [id number]   Basic Metabolic Panel:  Recent Labs  Lab 11/04/15 0758  NA 139  K 3.8  CL 104  CO2 28  GLUCOSE 99  BUN 20  CREATININE 0.89  CALCIUM 9.3   Coagulation Profile:  Recent Labs Lab 11/04/15 0758  INR 1.15   Urine analysis:    Component Value Date/Time   COLORURINE YELLOW 04/09/2015 [place] 04/09/2015 1355   LABSPEC 1.019 04/09/2015 1355   PHURINE 6.5 04/09/2015 1355   GLUCOSEU NEGATIVE 04/09/2015 1355   HGBUR NEGATIVE 04/09/2015 1355   [place] 04/09/2015 1355   KETONESUR NEGATIVE 04/09/2015 1355   PROTEINUR NEGATIVE 04/09/2015 1355   UROBILINOGEN 0.2 04/09/2015 1355   NITRITE NEGATIVE 04/09/2015 1355   LEUKOCYTESUR NEGATIVE 04/09/2015 1355     Radiological Exams on Admission: Dg Chest 2 View  11/04/2015  CLINICAL DATA:  Preop.  Hip fracture. EXAM: CHEST  2 VIEW COMPARISON:  08/13/2015 FINDINGS: Coarse parenchymal changes in the left lower lung zone are stable. Mild patchy density has developed at the right lung base likely related to volume loss. Lungs remain hyperaerated. No pneumothorax. Chronic appearing deformity of the right humeral head is noted. IMPRESSION: Chronic changes in the left lung. Atelectasis at the right base. Electronically Signed   By: Marybelle Killings M.D.   On: 11/04/2015 08:07   Ct Head Wo Contrast  11/04/2015  CLINICAL DATA:  Fall this morning, Per Dr. Keturah Shavers notes: History of Parkinson's disease,  dementia, COPD, She presents from [facility] screen this morning after being found on the floor at around 6 AM. According to staff they had seen her in bed at 5 a.m. EXAM: CT HEAD WITHOUT CONTRAST CT CERVICAL SPINE WITHOUT CONTRAST TECHNIQUE: Multidetector CT imaging of the head and cervical spine was performed following the standard protocol without intravenous contrast. Multiplanar CT image reconstructions of the cervical spine were also generated. COMPARISON:  Head CT 04/09/2015 FINDINGS: CT HEAD FINDINGS No intracranial hemorrhage. No parenchymal contusion. No midline shift or mass effect. Basilar cisterns are patent. No skull base fracture. No fluid in the paranasal sinuses or mastoid air cells. Orbits are normal. There are periventricular and subcortical white matter hypodensities. Generalized cortical atrophy. CT CERVICAL SPINE FINDINGS No prevertebral soft tissue swelling. Normal alignment of cervical vertebral bodies. No loss of vertebral body height. Normal facet articulation. Normal craniocervical junction. Mild joint space narrowing in the lower cervical spine with endplate sclerosis No evidence epidural or paraspinal hematoma. There is fine airspace opacities in the RIGHT upper lobe. IMPRESSION: 1. No intracranial trauma. 2. No cervical spine fracture. 3. Fine airspace disease in the RIGHT upper lobe with differential including mild edema versus less likely infection. Electronically Signed   By: Suzy Bouchard M.D.   On: 11/04/2015 08:12   Ct Cervical Spine Wo Contrast  11/04/2015  CLINICAL DATA:  Fall this morning, Per Dr. Keturah Shavers notes: History of Parkinson's disease, dementia, COPD, She presents from [facility] screen this morning after being found on the floor at around 6 AM. According to staff they had seen her in bed at 5 a.m. EXAM: CT HEAD WITHOUT CONTRAST CT CERVICAL SPINE WITHOUT CONTRAST TECHNIQUE: Multidetector CT imaging of the head and cervical spine was performed following the standard  protocol without intravenous contrast. Multiplanar CT image reconstructions of the cervical spine were also generated. COMPARISON:  Head CT 04/09/2015 FINDINGS: CT HEAD FINDINGS No intracranial hemorrhage. No parenchymal contusion. No midline shift or mass effect. Basilar cisterns are patent. No skull base fracture. No fluid in the paranasal sinuses or mastoid air cells.  Orbits are normal. There are periventricular and subcortical white matter hypodensities. Generalized cortical atrophy. CT CERVICAL SPINE FINDINGS No prevertebral soft tissue swelling. Normal alignment of cervical vertebral bodies. No loss of vertebral body height. Normal facet articulation. Normal craniocervical junction. Mild joint space narrowing in the lower cervical spine with endplate sclerosis No evidence epidural or paraspinal hematoma. There is fine airspace opacities in the RIGHT upper lobe. IMPRESSION: 1. No intracranial trauma. 2. No cervical spine fracture. 3. Fine airspace disease in the RIGHT upper lobe with differential including mild edema versus less likely infection. Electronically Signed   By: Suzy Bouchard M.D.   On: 11/04/2015 08:12   Dg Hip Unilat With Pelvis 2-3 Views Left  11/04/2015  CLINICAL DATA:  Fall at nursing home today with left hip pain EXAM: DG HIP (WITH OR WITHOUT PELVIS) 2-3V LEFT COMPARISON:  None. FINDINGS: Mildly to moderately impacted fracture left femoral neck. Pelvic bones intact. IMPRESSION: Acute left femur fracture Electronically Signed   By: Skipper Cliche M.D.   On: 11/04/2015 07:22    EKG: Independently reviewed. Sinus rhythm, no ST or TW changes  Assessment/Plan Femur fracture   - On the left - Pain control with morphine - Transfer to [facility] for surgical correction with Orthopedics    Bronchiectasis without acute exacerbation  - Continue home inhalers which include albuterol nebulizer and pulmicort.  - She is breathing comfortably, not requiring O2 when I saw her    Dementia -  Continue rivastigmine patch - Avoid delerium with day/night awareness, avoiding sedating medications as possible, family support    Vitamin D deficiency - Consider checking Vitamin D level with AML - Continue Vitamin D supplementation, 3000 IU daily    Osteoporosis - She is on denosumab, calcium and vitamin D.  Continue supplementation after surgery.  No need for denosumab today    Parkinson's disease  - Rotigotine, Rytary, rasagiline will be continued.   Diet: NPO   DVT prophylaxis: Lovenox Code Status: DNR Family Communication: Lamount Cranker, husband, at bedside Disposition Plan: To [facility] for surger Consults called: Orthopedics, Dr. Tamera Punt Admission status: Inpatient   Gilles Chiquito MD [facility] Pager [phone]  If 7PM-7AM, please contact night-coverage [email or web address] Password [facility]  11/04/2015, 12:53 PM

## 2015-11-04 NOTE — Progress Notes (Addendum)
This pt is active with [facility] for HHPT only (Tim updated CM)  Entered in d/c instructions  [place] [facility] are active with [facility] for physical therapy - Call them to restart services [place]  [id number] [phone]

## 2015-11-04 NOTE — ED Provider Notes (Signed)
CSN: [id number]     Arrival date & time 11/04/15  [id number] History   First MD Initiated Contact with Patient 11/04/15 [phone]     Chief Complaint  Patient presents with  . Fall  . Hip Pain     (Consider location/radiation/quality/duration/timing/severity/associated sxs/prior Treatment) HPI  Level V caveat due to pain 82 year old female who presents with left hip pain after fall this morning. History of Parkinson's disease, dementia, COPD, renal insufficiency, and Paget's bone disease. She presents from [facility] screen this morning after being found on the floor at around 6 AM. According to staff they had seen her in bed at 5 AM in the morning. Fall was unwitnessed and was complaining of left hip pain. Patient unable to provide any further history giving her underlying dementia. According to staff she is at her baseline mental status. She currently denies any headache, neck pain, back pain, abdominal pain, chest pain or difficulty breathing. Past Medical History  Diagnosis Date  . Colon cancer ([place])   . Parkinson's disease ([place])   . Osteoporosis   . Vitamin D deficiency   . Dementia   . Ventral hernia   . Pernicious anemia   . COPD (chronic obstructive pulmonary disease) ([place])   . Pagets disease, breast ([place])   . Renal disorder     Renal insufficiency  . Paget's bone disease    Past Surgical History  Procedure Laterality Date  . Colon surgery      bowel resection  . Breast surgery Left    Family History  Problem Relation Age of Onset  . Heart disease Mother   . Alzheimer's disease Father    Social History  Substance Use Topics  . Smoking status: Never Smoker   . Smokeless tobacco: Never Used  . Alcohol Use: Yes     Comment: glass of wine maybe once a week   OB History    No data available     Review of Systems  Unable to perform ROS: Dementia      Allergies  Zithromax; Iodine; Ivp dye; and Shellfish allergy  Home Medications   Prior to Admission medications    Medication Sig Start Date End Date Taking? Authorizing Provider  Respiratory Therapy Supplies (FLUTTER) DEVI Use at least 4 times a day 07/20/15  Yes Erick Colace, NP  acidophilus (RISAQUAD) CAPS capsule Take 1 capsule by mouth daily. 07/20/15   Erick Colace, NP  albuterol (PROVENTIL) (5 MG/ML) 0.5% nebulizer solution Take 0.5 mLs (2.5 mg total) by nebulization 2 (two) times daily. 08/16/15   Magdalen Spatz, NP  budesonide (PULMICORT) 0.25 MG/2ML nebulizer solution Take 0.25 mg by nebulization 2 (two) times daily.     Historical Provider, MD  Calcium-Vitamin D (CALTRATE 600 PLUS-VIT D PO) Take 1 tablet by mouth 2 (two) times daily.    Historical Provider, MD  Carbidopa-Levodopa ER (RYTARY) 48.75-195 MG CPCR Take 1 tablet by mouth 3 (three) times daily.    Historical Provider, MD  cholecalciferol (VITAMIN D) 1000 UNITS tablet Take 3,000 Units by mouth daily.     Historical Provider, MD  cyanocobalamin (,VITAMIN B-12,) 1000 MCG/ML injection every 30 (thirty) days.    Historical Provider, MD  denosumab (PROLIA) 60 MG/ML SOLN injection Inject 60 mg into the skin every 6 (six) months. Administer in upper arm, thigh, or abdomen    Historical Provider, MD  ENSURE PLUS (ENSURE PLUS) LIQD Take 237 mLs by mouth daily.    Historical Provider, MD  fluticasone (FLONASE) 50  MCG/ACT nasal spray Place 1 spray into both nostrils daily.    Historical Provider, MD  GuaiFENesin (DIABETIC TUSSIN EX PO) Take 5 mLs by mouth every 6 (six) hours as needed (congestion/cough.).    Historical Provider, MD  ibuprofen (ADVIL,MOTRIN) 400 MG tablet Take 400 mg by mouth every 6 (six) hours as needed for mild pain or moderate pain. Reported on 07/30/2015    Historical Provider, MD  levothyroxine (SYNTHROID, LEVOTHROID) 25 MCG tablet Take 25 mcg by mouth daily before breakfast.    Historical Provider, MD  loperamide (IMODIUM) 2 MG capsule Take 2 mg by mouth daily after breakfast.    Historical Provider, MD  ranitidine (ZANTAC) 75 MG  tablet Take 75 mg by mouth 2 (two) times daily.    Historical Provider, MD  rasagiline (AZILECT) 1 MG TABS tablet Take 1 mg by mouth daily. 1400.    Historical Provider, MD  Rivastigmine (EXELON) 13.3 MG/24HR PT24 Place 1 patch onto the skin daily.    Historical Provider, MD  rotigotine (NEUPRO) 4 MG/24HR Place 1 patch onto the skin daily.    Historical Provider, MD  Wheat Dextrin (BENEFIBER PO) Take 1 scoop by mouth daily.    Historical Provider, MD   BP 150/74 mmHg  Pulse 97  Temp(Src) 97.8 F (36.6 C) (Oral)  Resp 19  SpO2 100% Physical Exam Physical Exam  Nursing note and vitals reviewed. Constitutional: Well developed, elderly, non-toxic, and in no acute distress Head: Normocephalic and atraumatic.  Mouth/Throat: Oropharynx is clear and moist.  Neck: Normal range of motion. Neck supple. No cervical spine tenderness. Cardiovascular: Normal rate and regular rhythm.   Pulmonary/Chest: Effort normal and breath sounds normal.  Abdominal: Soft. There is no tenderness. There is no rebound and no guarding.  Musculoskeletal: Limited ROM of the left hip due to pain. No deformities or bruising. TTP of the left hip. +2 DP pulses Neurological: Alert, no facial droop, fluent speech, full strength b/l ankle dorsi/plantarflexion, sensation to light touch in tact throughout Skin: Skin is warm and dry.  Psychiatric: Cooperative  ED Course  Procedures (including critical care time) Labs Review Labs Reviewed  BASIC METABOLIC PANEL - Abnormal; Notable for the following:    GFR calc non Af Amer 56 (*)    All other components within normal limits  CBC WITH DIFFERENTIAL/PLATELET  PROTIME-INR  APTT  TYPE AND SCREEN    Imaging Review Dg Chest 2 View  11/04/2015  CLINICAL DATA:  Preop.  Hip fracture. EXAM: CHEST  2 VIEW COMPARISON:  08/13/2015 FINDINGS: Coarse parenchymal changes in the left lower lung zone are stable. Mild patchy density has developed at the right lung base likely related to  volume loss. Lungs remain hyperaerated. No pneumothorax. Chronic appearing deformity of the right humeral head is noted. IMPRESSION: Chronic changes in the left lung. Atelectasis at the right base. Electronically Signed   By: Marybelle Killings M.D.   On: 11/04/2015 08:07   Ct Head Wo Contrast  11/04/2015  CLINICAL DATA:  Fall this morning, Per Dr. Keturah Shavers notes: History of Parkinson's disease, dementia, COPD, She presents from [facility] screen this morning after being found on the floor at around 6 AM. According to staff they had seen her in bed at 5 a.m. EXAM: CT HEAD WITHOUT CONTRAST CT CERVICAL SPINE WITHOUT CONTRAST TECHNIQUE: Multidetector CT imaging of the head and cervical spine was performed following the standard protocol without intravenous contrast. Multiplanar CT image reconstructions of the cervical spine were also generated. COMPARISON:  Head  CT 04/09/2015 FINDINGS: CT HEAD FINDINGS No intracranial hemorrhage. No parenchymal contusion. No midline shift or mass effect. Basilar cisterns are patent. No skull base fracture. No fluid in the paranasal sinuses or mastoid air cells. Orbits are normal. There are periventricular and subcortical white matter hypodensities. Generalized cortical atrophy. CT CERVICAL SPINE FINDINGS No prevertebral soft tissue swelling. Normal alignment of cervical vertebral bodies. No loss of vertebral body height. Normal facet articulation. Normal craniocervical junction. Mild joint space narrowing in the lower cervical spine with endplate sclerosis No evidence epidural or paraspinal hematoma. There is fine airspace opacities in the RIGHT upper lobe. IMPRESSION: 1. No intracranial trauma. 2. No cervical spine fracture. 3. Fine airspace disease in the RIGHT upper lobe with differential including mild edema versus less likely infection. Electronically Signed   By: Suzy Bouchard M.D.   On: 11/04/2015 08:12   Ct Cervical Spine Wo Contrast  11/04/2015  CLINICAL DATA:  Fall this  morning, Per Dr. Keturah Shavers notes: History of Parkinson's disease, dementia, COPD, She presents from [facility] screen this morning after being found on the floor at around 6 AM. According to staff they had seen her in bed at 5 a.m. EXAM: CT HEAD WITHOUT CONTRAST CT CERVICAL SPINE WITHOUT CONTRAST TECHNIQUE: Multidetector CT imaging of the head and cervical spine was performed following the standard protocol without intravenous contrast. Multiplanar CT image reconstructions of the cervical spine were also generated. COMPARISON:  Head CT 04/09/2015 FINDINGS: CT HEAD FINDINGS No intracranial hemorrhage. No parenchymal contusion. No midline shift or mass effect. Basilar cisterns are patent. No skull base fracture. No fluid in the paranasal sinuses or mastoid air cells. Orbits are normal. There are periventricular and subcortical white matter hypodensities. Generalized cortical atrophy. CT CERVICAL SPINE FINDINGS No prevertebral soft tissue swelling. Normal alignment of cervical vertebral bodies. No loss of vertebral body height. Normal facet articulation. Normal craniocervical junction. Mild joint space narrowing in the lower cervical spine with endplate sclerosis No evidence epidural or paraspinal hematoma. There is fine airspace opacities in the RIGHT upper lobe. IMPRESSION: 1. No intracranial trauma. 2. No cervical spine fracture. 3. Fine airspace disease in the RIGHT upper lobe with differential including mild edema versus less likely infection. Electronically Signed   By: Suzy Bouchard M.D.   On: 11/04/2015 08:12   Dg Hip Unilat With Pelvis 2-3 Views Left  11/04/2015  CLINICAL DATA:  Fall at nursing home today with left hip pain EXAM: DG HIP (WITH OR WITHOUT PELVIS) 2-3V LEFT COMPARISON:  None. FINDINGS: Mildly to moderately impacted fracture left femoral neck. Pelvic bones intact. IMPRESSION: Acute left femur fracture Electronically Signed   By: Skipper Cliche M.D.   On: 11/04/2015 07:22   I have personally  reviewed and evaluated these images and lab results as part of my medical decision-making.   EKG Interpretation   Date/Time:  Thursday November 04 2015 08:25:08 EDT Ventricular Rate:  95 PR Interval:  148 QRS Duration: 86 QT Interval:  373 QTC Calculation: 469 R Axis:   -45 Text Interpretation:  Sinus rhythm LAD, consider left anterior fascicular  block Abnormal R-wave progression, late transition Borderline  repolarization abnormality No significant change since last tracing  Confirmed by Radek Carnero MD, Hinton Dyer [id number]) on 11/04/2015 8:28:23 AM Also confirmed  by Oleta Mouse MD, Kingstyn Deruiter [phone]), editor St[place] CT, Leda Gauze [id number])  on 11/04/2015  9:03:06 AM      MDM   Final diagnoses:  Femoral neck fracture, left, closed, initial encounter    82 year old female with history  of dementia and Parkinson's disease who presents with left hip pain after mechanical fall. X-ray with left femoral neck fracture. Extremity is neurovascularly intact. No other injuries noted on exam. We'll obtain CT head and cervical spine and CXR. These imaging studies are visualized and negative for any further injuries.  Dr. Tamera Punt from orthopedic surgery consulted. Recommending transfer to [facility] for definitive operative management this afternoon. Discussed with Dr. Daryll Drown from hospitalist service who will admit and arrange transfer to [facility].    Forde Dandy, MD 11/04/15 [phone]

## 2015-11-04 NOTE — Transfer of Care (Signed)
Immediate Anesthesia Transfer of Care Note  Patient: Shannon Dunn  Procedure(s) Performed: Procedure(s): CANNULATED HIP PINNING (Left) ARTHROPLASTY BIPOLAR HIP (HEMIARTHROPLASTY) (Left)  Patient Location: PACU  Anesthesia Type:General  Level of Consciousness: lethargic and responds to stimulation  Airway & Oxygen Therapy: Patient Spontanous Breathing and Patient connected to nasal cannula oxygen  Post-op Assessment: Report given to RN  Post vital signs: Reviewed and stable  Last Vitals:  Filed Vitals:   11/04/15 1145 11/04/15 1326  BP: 132/75 130/53  Pulse: 100 103  Temp:  37.1 C  Resp: 24 20    Complications: No apparent anesthesia complications

## 2015-11-04 NOTE — ED Notes (Signed)
Patient's room air stats were noted to be dropping into upper 80's.  Patient in no respiratory distress, no increased WOB.  Patient is alert and oriented per baseline and lung sounds are clear.  Patient placed on 2 L oxygen [place].

## 2015-11-04 NOTE — ED Notes (Signed)
Bed: WA04 Expected date:  Expected time:  Means of arrival:  Comments: EMS 82 yo female from [facility] Care/fall hip pain 200/110

## 2015-11-05 ENCOUNTER — Inpatient Hospital Stay (HOSPITAL_COMMUNITY): Payer: Medicare Other

## 2015-11-05 DIAGNOSIS — E43 Unspecified severe protein-calorie malnutrition: Secondary | ICD-10-CM | POA: Insufficient documentation

## 2015-11-05 DIAGNOSIS — S72145A Nondisplaced intertrochanteric fracture of left femur, initial encounter for closed fracture: Secondary | ICD-10-CM

## 2015-11-05 LAB — BASIC METABOLIC PANEL
ANION GAP: 9 (ref 5–15)
BUN: 14 mg/dL (ref 6–20)
CALCIUM: 8.1 mg/dL — AB (ref 8.9–10.3)
CO2: 28 mmol/L (ref 22–32)
Chloride: 98 mmol/L — ABNORMAL LOW (ref 101–111)
Creatinine, Ser: 0.95 mg/dL (ref 0.44–1.00)
GFR, EST AFRICAN AMERICAN: 60 mL/min — AB (ref >60)
GFR, EST NON AFRICAN AMERICAN: 52 mL/min — AB (ref >60)
Glucose, Bld: 100 mg/dL — ABNORMAL HIGH (ref 65–99)
Potassium: 4.2 mmol/L (ref 3.5–5.1)
Sodium: 135 mmol/L (ref 135–145)

## 2015-11-05 LAB — BLOOD GAS, ARTERIAL
ACID-BASE EXCESS: 0.6 mmol/L (ref 0.0–2.0)
BICARBONATE: 24.3 meq/L — AB (ref 20.0–24.0)
O2 CONTENT: 6 L/min
O2 SAT: 82.2 %
PO2 ART: 51.1 mmHg — AB (ref 80.0–100.0)
Patient temperature: 100.3
TCO2: 25.4 mmol/L (ref 0–100)
pCO2 arterial: 38.5 mmHg (ref 35.0–45.0)
pH, Arterial: 7.421 (ref 7.350–7.450)

## 2015-11-05 LAB — CBC
HEMATOCRIT: 33.8 % — AB (ref 36.0–46.0)
Hemoglobin: 10.5 g/dL — ABNORMAL LOW (ref 12.0–15.0)
MCH: 28.9 pg (ref 26.0–34.0)
MCHC: 31.1 g/dL (ref 30.0–36.0)
MCV: 93.1 fL (ref 78.0–100.0)
Platelets: 246 10*3/uL (ref 150–400)
RBC: 3.63 MIL/uL — ABNORMAL LOW (ref 3.87–5.11)
RDW: 14.8 % (ref 11.5–15.5)
WBC: 7.2 10*3/uL (ref 4.0–10.5)

## 2015-11-05 MED ORDER — SODIUM CHLORIDE 0.9 % IV SOLN
INTRAVENOUS | Status: DC
  Administered 2015-11-06 – 2015-11-07 (×2): via INTRAVENOUS

## 2015-11-05 MED ORDER — ENSURE ENLIVE PO LIQD
237.0000 mL | Freq: Three times a day (TID) | ORAL | Status: DC
  Administered 2015-11-08: 237 mL via ORAL

## 2015-11-05 MED ORDER — ASPIRIN 325 MG PO TBEC: 325.0000 mg | DELAYED_RELEASE_TABLET | Freq: Two times a day (BID) | ORAL | Status: DC

## 2015-11-05 MED ORDER — HYDROCODONE-ACETAMINOPHEN 5-325 MG PO TABS: 1.0000 | ORAL_TABLET | Freq: Four times a day (QID) | ORAL | Status: DC | PRN

## 2015-11-05 MED ORDER — PIPERACILLIN-TAZOBACTAM IN DEX 2-0.25 GM/50ML IV SOLN
2.2500 g | Freq: Three times a day (TID) | INTRAVENOUS | Status: DC
  Administered 2015-11-05 – 2015-11-08 (×8): 2.25 g via INTRAVENOUS
  Filled 2015-11-05 (×11): qty 50

## 2015-11-05 MED ORDER — RIVASTIGMINE 13.3 MG/24HR TD PT24
13.3000 mg | MEDICATED_PATCH | TRANSDERMAL | Status: DC
  Administered 2015-11-05 – 2015-11-07 (×3): 13.3 mg via TRANSDERMAL

## 2015-11-05 MED ORDER — HYDROCODONE-ACETAMINOPHEN 5-325 MG PO TABS: 1.0000 | ORAL_TABLET | Freq: Three times a day (TID) | ORAL | Status: DC | PRN

## 2015-11-05 MED ORDER — CARBIDOPA-LEVODOPA 25-100 MG PO TABS
2.0000 | ORAL_TABLET | Freq: Three times a day (TID) | ORAL | Status: DC
  Administered 2015-11-05 – 2015-11-06 (×3): 2 via ORAL
  Filled 2015-11-05: qty 2

## 2015-11-05 NOTE — Progress Notes (Signed)
Utilization review completed.  

## 2015-11-05 NOTE — Progress Notes (Signed)
   PATIENT ID: Shannon Dunn   1 Day Post-Op Procedure(s) (LRB): CANNULATED HIP PINNING (Left) ARTHROPLASTY BIPOLAR HIP (HEMIARTHROPLASTY) (Left)  Subjective: Resting comfortably in bed. Denies pain at rest, some hip pain with mvmt.   Objective:  Filed Vitals:   11/05/15 0016 11/05/15 0451  BP: 112/57 142/70  Pulse: 100 102  Temp: 99 F (37.2 C) 98.6 F (37 C)  Resp: 18 18     L hip dressing c/d/i Wiggles toes, distally NVI  Labs:   Recent Labs  11/04/15 0758 11/04/15 1404 11/05/15 0455  HGB 12.6 10.8* 10.5*   Recent Labs  11/04/15 1404 11/05/15 0455  WBC 8.8 7.2  RBC 3.69* 3.63*  HCT 33.9* 33.8*  PLT 278 246   Recent Labs  11/04/15 0758 11/04/15 1404 11/05/15 0455  NA 139  --  135  K 3.8  --  4.2  CL 104  --  98*  CO2 28  --  28  BUN 20  --  14  CREATININE 0.89 0.92 0.95  GLUCOSE 99  --  100*  CALCIUM 9.3  --  8.1*    Assessment and Plan: 1 day s/p left him pinning for fracture WBAT norco for pain control, scripts in chart ready for d/c when appropriate Fu with Dr. Tamera Punt in 2 weeks Dr. Berenice Primas to round this weekend  VTE proph: ASA 325mg  BID, SCDs

## 2015-11-05 NOTE — Progress Notes (Signed)
Initial Nutrition Assessment  DOCUMENTATION CODES:   Severe malnutrition in context of chronic illness  INTERVENTION:  Provide Ensure Enlive po TID, each supplement provides 350 kcal and 20 grams of protein.  Encourage adequate PO intake.   NUTRITION DIAGNOSIS:   Malnutrition related to chronic illness as evidenced by severe depletion of body fat, severe depletion of muscle mass.  GOAL:   Patient will meet greater than or equal to 90% of their needs  MONITOR:   PO intake, Supplement acceptance, Weight trends, Labs, I & O's  REASON FOR ASSESSMENT:   Consult Hip fracture protocol  ASSESSMENT:   82 y.o. 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 Report from nursing home is that she was seen in bed at 5am, but then was found down at [place] fall. She was complaining of hip pain in the ED and imaging revealed a hip fracture.  Procedure(4/20): CANNULATED HIP PINNING (Left) ARTHROPLASTY BIPOLAR HIP (HEMIARTHROPLASTY) (Left)  Pt was asleep during time of visit and would not wake. Family at bedside. They reports pt with no po intake yet today. PO intake 25% at dinner last night. Family reports pt usually consumes 3 meals a day with Ensure 2-3 times daily. Usual body weight reported to be 82-86 lbs. No new weight recorded, however weight has been stable. Per RN, pt may need swallow evaluation done as pt lethargic. RD to increase Ensure to TID to aid in caloric and protein needs.   Nutrition-Focused physical exam completed. Findings are severe fat depletion, severe muscle depletion, and mild edema.   Labs and medications reviewed.   Diet Order:  Diet regular Room service appropriate?: Yes; Fluid consistency:: Thin  Skin:   (Incision on L thigh)  Last BM:  4/20  Height:   Ht Readings from Last 1 Encounters:  09/13/15 4\' 9"  (1.448 m)    Weight:    Wt Readings from Last 1 Encounters:  09/13/15 82 lb 12.8 oz (37.558 kg)    Ideal Body Weight:  43 kg  BMI:  There is no weight on file to calculate BMI.  Estimated Nutritional Needs:   Kcal:  1300-1500  Protein:  55-65 grams  Fluid:  >/= 1.5 L/day  EDUCATION NEEDS:   No education needs identified at this time  Corrin Parker, MS, RD, LDN Pager # [phone] After hours/ weekend pager # [phone]

## 2015-11-05 NOTE — Progress Notes (Signed)
PROGRESS NOTE    Shannon Dunn  [id number] DOB: 1923/10/28 DOA: 11/04/2015 PCP: Renata Caprice, DO (Confirm with patient/family/NH records and if not entered, this HAS to be entered at [facility] point of entry. "No PCP" if truly none.) Outpatient Specialists: (Specify speciality and name if known)    Brief Narrative: Shannon Dunn is a 82 y.o. 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 Report from nursing home is that she was seen in bed at 5am, but then was found down at [place] fall. She was complaining of hip pain in the ED and imaging revealed a hip fracture. She has dementia, history was attempted, but difficult to obtain.  ED Course: Imaging including head CT and neck CT were normal. Hip xray revealed a hip fracture. Orthopedics, Dr. Tamera Punt was consulted and recommended hospitalist admission and transfer to [facility] for surgery.   Assessment & Plan:   Active Problems:   Bronchiectasis without acute exacerbation (HCC)   Dementia   Vitamin D deficiency   Osteoporosis   Parkinson's disease ([place])   Femur fracture ([place])   Hip fracture, left (HCC)   Protein-calorie malnutrition, severe   Femur fracture  - On the left - Pain control with morphine - Underwent pinning of the left hip   Altered mental status Metabolic encephalopathy from medication Hold all sedative medication Keep nothing by mouth.  Bronchiectasis without acute exacerbation  - Continue home inhalers which include albuterol nebulizer and pulmicort.  - She is breathing comfortably, not requiring O2 when I saw her   Dementia - Continue rivastigmine patch - Avoid delerium with day/night awareness, avoiding sedating medications as possible, family support   Vitamin D deficiency - Consider checking Vitamin D level with AML - Continue Vitamin D supplementation, 3000 IU daily    Osteoporosis - She is on denosumab, calcium and vitamin D. Continue supplementation after surgery. No need for denosumab today   Parkinson's disease  - Rotigotine, Rytary, rasagiline will be continued.     DVT prophylaxis: (Lovenox/Heparin/SCD's/anticoagulated/None (if comfort care) Code Status: (Full/Partial - specify details) Family Communication: (Specify name, relationship & date discussed. NO "discussed with patient") Disposition Plan: (specify when and where you expect patient to be discharged)   Consultants:   Orthopedic surgery   Procedures:   X-ray of the left hip: Acute left femur fracture    Subjective: Patient is quite somnolent. Unable to follow the commands Objective: Filed Vitals:   11/05/15 0858 11/05/15 1121 11/05/15 1141 11/05/15 1425  BP:  134/54  122/62  Pulse:  93  103  Temp:  99.1 F (37.3 C)  98.3 F (36.8 C)  TempSrc:  Oral  Axillary  Resp:  18  18  SpO2: 91% 100% 95% 99%    Intake/Output Summary (Last 24 hours) at 11/05/15 1613 Last data filed at 11/04/15 2033  Gross per 24 hour  Intake    860 ml  Output    251 ml  Net    609 ml   There were no vitals filed for this visit.  Examination:  General exam: Appears calm and comfortable, somnolent  Respiratory system: Clear to auscultation. Respiratory effort normal. Cardiovascular system: S1 & S2 heard, RRR. No JVD, murmurs, rubs, gallops or clicks. No pedal edema. Gastrointestinal system: Abdomen is nondistended, soft and nontender. No organomegaly or masses felt. Normal bowel sounds heard. Central nervous system: Alert and oriented. No focal neurological  deficits. Extremities: Symmetric 5 x 5 power. Skin: No rashes, lesions or ulcers Psychiatry: Judgement and insight appear normal. Mood & affect appropriate.     Data Reviewed: I have personally reviewed following labs and imaging studies  CBC:  Recent Labs Lab 11/04/15 0758 11/04/15 1404 11/05/15 0455  WBC 7.1 8.8 7.2   NEUTROABS 5.3  --   --   HGB 12.6 10.8* 10.5*  HCT 37.6 33.9* 33.8*  MCV 90.2 91.9 93.1  PLT 309 278 0000000   Basic Metabolic Panel:  Recent Labs Lab 11/04/15 0758 11/04/15 1404 11/05/15 0455  NA 139  --  135  K 3.8  --  4.2  CL 104  --  98*  CO2 28  --  28  GLUCOSE 99  --  100*  BUN 20  --  14  CREATININE 0.89 0.92 0.95  CALCIUM 9.3  --  8.1*   GFR: CrCl cannot be calculated (Unknown ideal weight.). Liver Function Tests: No results for input(s): AST, ALT, ALKPHOS, BILITOT, PROT, ALBUMIN in the last 168 hours. No results for input(s): LIPASE, AMYLASE in the last 168 hours. No results for input(s): AMMONIA in the last 168 hours. Coagulation Profile:  Recent Labs Lab 11/04/15 0758  INR 1.15   Cardiac Enzymes: No results for input(s): CKTOTAL, CKMB, CKMBINDEX, TROPONINI in the last 168 hours. BNP (last 3 results) No results for input(s): PROBNP in the last 8760 hours. HbA1C: No results for input(s): HGBA1C in the last 72 hours. CBG: No results for input(s): GLUCAP in the last 168 hours. Lipid Profile: No results for input(s): CHOL, HDL, LDLCALC, TRIG, CHOLHDL, LDLDIRECT in the last 72 hours. Thyroid Function Tests: No results for input(s): TSH, T4TOTAL, FREET4, T3FREE, THYROIDAB in the last 72 hours. Anemia Panel: No results for input(s): VITAMINB12, FOLATE, FERRITIN, TIBC, IRON, RETICCTPCT in the last 72 hours. Urine analysis:    Component Value Date/Time   COLORURINE YELLOW 04/09/2015 [place] 04/09/2015 1355   LABSPEC 1.019 04/09/2015 1355   PHURINE 6.5 04/09/2015 1355   GLUCOSEU NEGATIVE 04/09/2015 1355   HGBUR NEGATIVE 04/09/2015 1355   [place] 04/09/2015 1355   KETONESUR NEGATIVE 04/09/2015 1355   PROTEINUR NEGATIVE 04/09/2015 1355   UROBILINOGEN 0.2 04/09/2015 1355   NITRITE NEGATIVE 04/09/2015 1355   LEUKOCYTESUR NEGATIVE 04/09/2015 1355   Sepsis Labs: @LABRCNTIP (procalcitonin:4,lacticidven:4)  ) Recent Results  (from the past 240 hour(s))  Surgical pcr screen     Status: None   Collection Time: 11/04/15  2:15 PM  Result Value Ref Range Status   MRSA, PCR NEGATIVE NEGATIVE Final   Staphylococcus aureus NEGATIVE NEGATIVE Final    Comment:        The Xpert SA Assay (FDA approved for NASAL specimens in patients over 79 years of age), is one component of a comprehensive surveillance program.  Test performance has been validated by [facility] for patients greater than or equal to 69 year old. It is not intended to diagnose infection nor to guide or monitor treatment.          Radiology Studies: Dg Chest 2 View  11/04/2015  CLINICAL DATA:  Preop.  Hip fracture. EXAM: CHEST  2 VIEW COMPARISON:  08/13/2015 FINDINGS: Coarse parenchymal changes in the left lower lung zone are stable. Mild patchy density has developed at the right lung base likely related to volume loss. Lungs remain hyperaerated. No pneumothorax. Chronic appearing deformity of the right humeral head is noted. IMPRESSION: Chronic changes in the left lung.  Atelectasis at the right base. Electronically Signed   By: Marybelle Killings M.D.   On: 11/04/2015 08:07   Ct Head Wo Contrast  11/04/2015  CLINICAL DATA:  Fall this morning, Per Dr. Keturah Shavers notes: History of Parkinson's disease, dementia, COPD, She presents from [facility] screen this morning after being found on the floor at around 6 AM. According to staff they had seen her in bed at 5 a.m. EXAM: CT HEAD WITHOUT CONTRAST CT CERVICAL SPINE WITHOUT CONTRAST TECHNIQUE: Multidetector CT imaging of the head and cervical spine was performed following the standard protocol without intravenous contrast. Multiplanar CT image reconstructions of the cervical spine were also generated. COMPARISON:  Head CT 04/09/2015 FINDINGS: CT HEAD FINDINGS No intracranial hemorrhage. No parenchymal contusion. No midline shift or mass effect. Basilar cisterns are patent. No skull base fracture. No fluid in the  paranasal sinuses or mastoid air cells. Orbits are normal. There are periventricular and subcortical white matter hypodensities. Generalized cortical atrophy. CT CERVICAL SPINE FINDINGS No prevertebral soft tissue swelling. Normal alignment of cervical vertebral bodies. No loss of vertebral body height. Normal facet articulation. Normal craniocervical junction. Mild joint space narrowing in the lower cervical spine with endplate sclerosis No evidence epidural or paraspinal hematoma. There is fine airspace opacities in the RIGHT upper lobe. IMPRESSION: 1. No intracranial trauma. 2. No cervical spine fracture. 3. Fine airspace disease in the RIGHT upper lobe with differential including mild edema versus less likely infection. Electronically Signed   By: Suzy Bouchard M.D.   On: 11/04/2015 08:12   Ct Cervical Spine Wo Contrast  11/04/2015  CLINICAL DATA:  Fall this morning, Per Dr. Keturah Shavers notes: History of Parkinson's disease, dementia, COPD, She presents from [facility] screen this morning after being found on the floor at around 6 AM. According to staff they had seen her in bed at 5 a.m. EXAM: CT HEAD WITHOUT CONTRAST CT CERVICAL SPINE WITHOUT CONTRAST TECHNIQUE: Multidetector CT imaging of the head and cervical spine was performed following the standard protocol without intravenous contrast. Multiplanar CT image reconstructions of the cervical spine were also generated. COMPARISON:  Head CT 04/09/2015 FINDINGS: CT HEAD FINDINGS No intracranial hemorrhage. No parenchymal contusion. No midline shift or mass effect. Basilar cisterns are patent. No skull base fracture. No fluid in the paranasal sinuses or mastoid air cells. Orbits are normal. There are periventricular and subcortical white matter hypodensities. Generalized cortical atrophy. CT CERVICAL SPINE FINDINGS No prevertebral soft tissue swelling. Normal alignment of cervical vertebral bodies. No loss of vertebral body height. Normal facet articulation. Normal  craniocervical junction. Mild joint space narrowing in the lower cervical spine with endplate sclerosis No evidence epidural or paraspinal hematoma. There is fine airspace opacities in the RIGHT upper lobe. IMPRESSION: 1. No intracranial trauma. 2. No cervical spine fracture. 3. Fine airspace disease in the RIGHT upper lobe with differential including mild edema versus less likely infection. Electronically Signed   By: Suzy Bouchard M.D.   On: 11/04/2015 08:12   Dg Hip Operative Unilat With Pelvis Left  11/04/2015  CLINICAL DATA:  Left hip pinning, subcapital femoral neck fracture EXAM: OPERATIVE left HIP (WITH PELVIS IF PERFORMED) 1 VIEWS TECHNIQUE: Fluoroscopic spot image(s) were submitted for interpretation post-operatively. COMPARISON:  Film from earlier in the same day FINDINGS: Three fixation screws are noted traversing the left femoral neck into the femoral head. The overall appearance is stable from the prior exam. IMPRESSION: Status post left hip pinning. Please see the operative report for further details regarding fluoroscopy  time Electronically Signed   By: Inez Catalina M.D.   On: 11/04/2015 17:13   Dg Hip Unilat With Pelvis 2-3 Views Left  11/04/2015  CLINICAL DATA:  Fall at nursing home today with left hip pain EXAM: DG HIP (WITH OR WITHOUT PELVIS) 2-3V LEFT COMPARISON:  None. FINDINGS: Mildly to moderately impacted fracture left femoral neck. Pelvic bones intact. IMPRESSION: Acute left femur fracture Electronically Signed   By: Skipper Cliche M.D.   On: 11/04/2015 07:22        Scheduled Meds: . acidophilus  1 capsule Oral Daily  . albuterol  2.5 mg Nebulization BID  . aspirin EC  325 mg Oral BID  . budesonide  0.25 mg Nebulization Daily  . carbidopa-levodopa  1 tablet Oral TID  . enoxaparin (LOVENOX) injection  40 mg Subcutaneous Q24H  . famotidine  20 mg Oral BID  . feeding supplement (ENSURE ENLIVE)  237 mL Oral TID BM  . fluticasone  1 spray Each Nare Daily  .  levothyroxine  25 mcg Oral QAC breakfast  . rasagiline  1 mg Oral Daily  . Rivastigmine  13.3 mg Transdermal Q24H  . rotigotine  1 patch Transdermal Q24H   Continuous Infusions: . lactated ringers       LOS: 1 day    Time spent: 23min   Dasiah Hooley, MD [facility] Pager [phone]  If 7PM-7AM, please contact night-coverage [email or web address] Password [facility] 11/05/2015, 4:13 PM

## 2015-11-05 NOTE — Clinical Social Work Note (Addendum)
CSW spoke to Shannon Dunn ALF nurse and she will contact supervisor to make decision if patient is able to return for therapy at SNF.  [facility] Dunn said there will not be anyone over the weekend who can admit patient, at the earliest it would be Monday and they will have to review patient's clinicals.  CSW to continue to follow patient's progress.  3:15pm  CSW received phone call from nurse at [facility], who said they could potentially take patient back on Monday or Tuesday once updated clinicals are faxed to ALF.  ALF said patient is already active with [facility].  Shannon Dunn. [place], MSW, [place] 11/05/2015 3:04 PM

## 2015-11-05 NOTE — Care Management Note (Signed)
Case Management Note  Patient Details  Name: Shannon Dunn MRN: [id number] Date of Birth: 03-13-1924  Subjective/Objective:            Admitted with left femur fracture, s/p left cannulated screw fixation        Action/Plan: PT recommended SNF. Referral made to CSW. Will continue to follow.  Expected Discharge Date:   (UNKNOWN)               Expected Discharge Plan:  Skilled Nursing Facility  In-House Referral:  Clinical Social Work  Discharge planning Services  CM Consult  Post Acute Care Choice:    Choice offered to:     DME Arranged:    DME Agency:     HH Arranged:    [place] Agency:     Status of Service:  In process, will continue to follow  Medicare Important Message Given:    Date Medicare IM Given:    Medicare IM give by:    Date Additional Medicare IM Given:    Additional Medicare Important Message give by:     If discussed at [place] of Stay Meetings, dates discussed:    Additional Comments:  Nila Nephew, RN 11/05/2015, 11:58 AM

## 2015-11-05 NOTE — Significant Event (Signed)
Rapid Response Event Note  Overview: Time Called: 1845 Arrival Time: [id number] Event Type: Respiratory  Initial Focused Assessment: Per RN patient desat with turning. BP 155/73  HR 110  RR 32  O2 sat 89% on 5L [place]  Rectal Temp 100.3 Lung sounds with rhochi and regular heart tones, increased WOB Patient unable to cough on command Patient with history of dementia MD notified, orders received.   Interventions: NT suctioned , small amount of thick secretions Oral care done PCXR done ABG done Placed on 50% venti  O2 sats 100%,  RR 24, Patient is less labored. Will continue to monitor, RN to call if assistance needed.   Event Summary: Name of Physician Notified: VASIREDDY at 1850    at    Outcome: Stayed in room and stabalized  Event End Time: 1930  Raliegh Ip

## 2015-11-05 NOTE — Evaluation (Signed)
Physical Therapy Evaluation Patient Details Name: Shannon Dunn MRN: [id number] DOB: 1924-02-01 Today's Date: 11/05/2015   History of Present Illness  82 y.o. female s/p cannulated hip screw fixation 4/20 after experiencing fall, with PMHx of PD, dementia,   Clinical Impression  Pt lethargic on arrival with family present and agreeable to therapy session. Pt per family has had decreased arousal all day due to narcotics and anaesthesia. Pt able to open eyes and respond to questions EOB although unable to state family present. She required max assist for hand over hand assist to control and drink one sip from coffee cup. Per family pt normally at memory care, wandering, and moving around without DME but unable to follow commands for HEP, mobility or function at this time. Pt with decreased cognition, balance, strength and function who will benefit from acute therapy to maximize mobility and independence to decrease burden of care and improve quality of life. Family educated for bil LE PROM exercises as well as progression and plan. Recommend SNF but family very interested in maintaining pt at ALF for consistency of environment with baseline dementia.     Follow Up Recommendations SNF;Supervision/Assistance - 24 hour    Equipment Recommendations  None recommended by PT    Recommendations for Other Services       Precautions / Restrictions Precautions Precautions: Fall Restrictions Weight Bearing Restrictions: Yes LLE Weight Bearing: Weight bearing as tolerated      Mobility  Bed Mobility Overal bed mobility: Needs Assistance;+2 for physical assistance Bed Mobility: Supine to Sit;Sit to Supine     Supine to sit: Total assist;HOB elevated Sit to supine: +2 for physical assistance;Total assist   General bed mobility comments: total assist with HOB 40degrees to pivot to EOB, for return to bed total +2 with bil LE and trunk supported to pivot back onto bed surface. Total assist with pad  for reciprocal scooting to EOB  Transfers                 General transfer comment: pt unable due to lethargy  Ambulation/Gait                Stairs            Wheelchair Mobility    Modified Rankin (Stroke Patients Only)       Balance Overall balance assessment: Needs assistance Sitting-balance support: Feet supported;Feet unsupported Sitting balance-Leahy Scale: Poor Sitting balance - Comments: pt initially max assist for sitting balance but able to maintain upright posture 3 min with min assist before fatiguing with return to max assist for sitting balance Postural control: Posterior lean                                   Pertinent Vitals/Pain Pain Assessment: Faces (PAINAD= 2) Faces Pain Scale: No hurt    Home Living                        Prior Function                 Hand Dominance        Extremity/Trunk Assessment   Upper Extremity Assessment: Generalized weakness;Difficult to assess due to impaired cognition           Lower Extremity Assessment: Generalized weakness;Difficult to assess due to impaired cognition      Cervical / Trunk Assessment: Kyphotic  Communication  Cognition Arousal/Alertness: Lethargic;Suspect due to medications Behavior During Therapy: Flat affect Overall Cognitive Status: Impaired/Different from baseline Area of Impairment: Attention   Current Attention Level: Focused Memory: Decreased short-term memory         General Comments: Pt with baseline dementia and memory deficits but limited by lethargy throughout, unaware of family present    General Comments      Exercises General Exercises - Lower Extremity Long Arc Quad: PROM;Left;5 reps;Seated Hip ABduction/ADduction: PROM;Left;5 reps;Supine Hip Flexion/Marching: PROM;Left;5 reps;Seated      Assessment/Plan    PT Assessment Patient needs continued PT services  PT Diagnosis Difficulty  walking;Generalized weakness;Altered mental status   PT Problem List Decreased strength;Decreased cognition;Decreased activity tolerance;Decreased safety awareness;Decreased skin integrity;Decreased balance;Decreased mobility;Pain  PT Treatment Interventions Gait training;DME instruction;Functional mobility training;Therapeutic activities;Therapeutic exercise;Balance training;Patient/family education;Cognitive remediation;Neuromuscular re-education   PT Goals (Current goals can be found in the Care Plan section) Acute Rehab PT Goals Patient Stated Goal: return to prior function PT Goal Formulation: With family Time For Goal Achievement: 11/19/15 Potential to Achieve Goals: Fair    Frequency Min 3X/week   Barriers to discharge Decreased caregiver support      Co-evaluation               End of Session   Activity Tolerance: Patient limited by lethargy Patient left: in bed;with call bell/phone within reach;with nursing/sitter in room;with family/visitor present Nurse Communication: Mobility status;Precautions         Time: [id number] PT Time Calculation (min) (ACUTE ONLY): 33 min   Charges:   PT Evaluation $PT Eval Moderate Complexity: 1 Procedure PT Treatments $Therapeutic Activity: 8-22 mins   PT G CodesMelford Aase 11/05/2015, 2:13 PM Elwyn Reach, [place]

## 2015-11-05 NOTE — Evaluation (Signed)
Clinical/Bedside Swallow Evaluation Patient Details  Name: ADALEN DILL MRN: [id number] Date of Birth: 05/08/1924  Today's Date: 11/05/2015 Time: SLP Start Time (ACUTE ONLY): 48 SLP Stop Time (ACUTE ONLY): 1157 SLP Time Calculation (min) (ACUTE ONLY): 27 min  Past Medical History:  Past Medical History  Diagnosis Date  . Colon cancer ([place])   . Parkinson's disease ([place])   . Osteoporosis   . Vitamin D deficiency   . Dementia   . Ventral hernia   . Pernicious anemia   . COPD (chronic obstructive pulmonary disease) ([place])   . Pagets disease, breast ([place])   . Renal disorder     Renal insufficiency  . Paget's bone disease    Past Surgical History:  Past Surgical History  Procedure Laterality Date  . Colon surgery      bowel resection  . Breast surgery Left    HPI:  Clarissa Olle is a 82 yo F with medical history significant for Parkinson's Disease, Alzheimer's Dementia, bronchiectasis, colon cancer and now left hip fx due to unwitnessed all at nursing home. Pt underwent cannulated hip screw fixation of left hip on 4/20. CXR shows atelectasis at the right lung base. No prior hx of dysphagia in the chart.    Assessment / Plan / Recommendation Clinical Impression  SLP had planned to defer assessment due to pt lethargy, but pts dtr explained the pt needed to take her parkinson's medications so SLP assisted in administering that medication with RN present. Completed oral care to facilitate intake and provided head support and verbal/tactile cues to increase pt awareness of and participation in PO trials. She demosntrated ability to purse lips to the cup for small sips and triggered swallow with moderate oral discoordination and holdgin and probable delay in swallow. Pt did have one instance of premature spill that resulted in coughing.  With pill capsule emptied into teaspoon of puree pt was able to consume puree and meds with moderate cueing.  Explained risk of aspiration due to history  of Parkinsons with new weakness and AMS due to pain medication to family and RN and encouraged NPO status (except for Parkinson's meds) until SLP could return to reasses pt in am. MD, RN and family in agreement.     Aspiration Risk  Severe aspiration risk    Diet Recommendation NPO except meds   Medication Administration: Crushed with puree Postural Changes: Seated upright at 90 degrees    Other  Recommendations Oral Care Recommendations: Oral care QID;Oral care before and after PO Other Recommendations: Have oral suction available   Follow up Recommendations  Skilled Nursing facility    Frequency and Duration min 2x/week  2 weeks       Prognosis Prognosis for Safe Diet Advancement: Good Barriers to Reach Goals: Cognitive deficits      Swallow Study   General HPI: Teres Kyles is a 82 yo F with medical history significant for Parkinson's Disease, Alzheimer's Dementia, bronchiectasis, colon cancer and now left hip fx due to unwitnessed all at nursing home. Pt underwent cannulated hip screw fixation of left hip on 4/20. CXR shows atelectasis at the right lung base. No prior hx of dysphagia in the chart.  Type of Study: Bedside Swallow Evaluation Previous Swallow Assessment: none Diet Prior to this Study: Regular;Thin liquids Temperature Spikes Noted: No Respiratory Status: Nasal cannula History of Recent Intubation: No Behavior/Cognition: Lethargic/Drowsy;Requires cueing Oral Cavity Assessment: Dry;Dried secretions Oral Care Completed by SLP: Yes Oral Cavity - Dentition: Adequate natural dentition Vision: Impaired  for self-feeding Self-Feeding Abilities: Total assist Patient Positioning: Upright in bed Baseline Vocal Quality: Low vocal intensity Volitional Cough: Cognitively unable to elicit Volitional Swallow: Unable to elicit    Oral/Motor/Sensory Function Overall Oral Motor/Sensory Function: Generalized oral weakness   Ice Chips     Thin Liquid Thin Liquid:  Impaired Presentation: Cup Oral Phase Impairments: Poor awareness of bolus;Reduced lingual movement/coordination Oral Phase Functional Implications: Oral holding;Prolonged oral transit Pharyngeal  Phase Impairments: Cough - Immediate    Nectar Thick Nectar Thick Liquid: Not tested   Honey Thick Honey Thick Liquid: Not tested   Puree Puree: Impaired Presentation: Spoon Oral Phase Impairments: Poor awareness of bolus Oral Phase Functional Implications: Prolonged oral transit   Solid   GO   Solid: Not tested       Herbie [place], MA CCC-SLP [phone]  Lynann Beaver 11/05/2015,2:22 PM

## 2015-11-05 NOTE — Anesthesia Postprocedure Evaluation (Signed)
Anesthesia Post Note  Patient: Shannon Dunn  Procedure(s) Performed: Procedure(s) (LRB): CANNULATED HIP PINNING (Left) ARTHROPLASTY BIPOLAR HIP (HEMIARTHROPLASTY) (Left)  Patient location during evaluation: PACU Anesthesia Type: General Level of consciousness: awake and alert Pain management: pain level controlled Vital Signs Assessment: post-procedure vital signs reviewed and stable Respiratory status: spontaneous breathing, nonlabored ventilation, respiratory function stable and patient connected to nasal cannula oxygen Cardiovascular status: blood pressure returned to baseline and stable Postop Assessment: no signs of nausea or vomiting Anesthetic complications: no    Last Vitals:  Filed Vitals:   11/05/15 1425 11/05/15 1845  BP: 122/62 155/73  Pulse: 103 111  Temp: 36.8 C 37.9 C  Resp: 18 32    Last Pain:  Filed Vitals:   11/05/15 1911  PainSc: 4                  Bary Limbach,W. EDMOND

## 2015-11-06 LAB — BASIC METABOLIC PANEL
Anion gap: 11 (ref 5–15)
BUN: 15 mg/dL (ref 6–20)
CO2: 22 mmol/L (ref 22–32)
CREATININE: 0.98 mg/dL (ref 0.44–1.00)
Calcium: 7.4 mg/dL — ABNORMAL LOW (ref 8.9–10.3)
Chloride: 104 mmol/L (ref 101–111)
GFR calc Af Amer: 58 mL/min — ABNORMAL LOW (ref >60)
GFR, EST NON AFRICAN AMERICAN: 50 mL/min — AB (ref >60)
Glucose, Bld: 74 mg/dL (ref 65–99)
Potassium: 3.8 mmol/L (ref 3.5–5.1)
SODIUM: 137 mmol/L (ref 135–145)

## 2015-11-06 LAB — CBC
HCT: 32.8 % — ABNORMAL LOW (ref 36.0–46.0)
Hemoglobin: 10.1 g/dL — ABNORMAL LOW (ref 12.0–15.0)
MCH: 29 pg (ref 26.0–34.0)
MCHC: 30.8 g/dL (ref 30.0–36.0)
MCV: 94.3 fL (ref 78.0–100.0)
PLATELETS: 221 10*3/uL (ref 150–400)
RBC: 3.48 MIL/uL — ABNORMAL LOW (ref 3.87–5.11)
RDW: 15 % (ref 11.5–15.5)
WBC: 11.1 10*3/uL — ABNORMAL HIGH (ref 4.0–10.5)

## 2015-11-06 MED ORDER — ASPIRIN 81 MG PO CHEW
324.0000 mg | CHEWABLE_TABLET | Freq: Two times a day (BID) | ORAL | Status: DC
  Administered 2015-11-06: 81 mg via ORAL
  Filled 2015-11-06: qty 4

## 2015-11-06 MED ORDER — CARBIDOPA-LEVODOPA 25-100 MG PO TABS
2.0000 | ORAL_TABLET | Freq: Three times a day (TID) | ORAL | Status: DC
  Administered 2015-11-06: 1 via ORAL
  Administered 2015-11-07 – 2015-11-08 (×4): 2 via ORAL
  Filled 2015-11-06 (×3): qty 2

## 2015-11-06 MED ORDER — FAMOTIDINE 40 MG/5ML PO SUSR
20.0000 mg | Freq: Two times a day (BID) | ORAL | Status: DC
  Administered 2015-11-06 – 2015-11-08 (×3): 20 mg via ORAL
  Filled 2015-11-06 (×5): qty 2.5

## 2015-11-06 MED ORDER — CARBIDOPA-LEVODOPA 25-100 MG PO TABS: 1.0000 | ORAL_TABLET | Freq: Three times a day (TID) | ORAL | Status: DC

## 2015-11-06 NOTE — Clinical Social Work Note (Signed)
Clinical Social Work Assessment  Patient Details  Name: Shannon Dunn MRN: [id number] Date of Birth: Mar 19, 1924  Date of referral:  11/06/15               Reason for consult:  Facility Placement                Permission sought to share information with:  Facility Sport and exercise psychologist, Family Supports Permission granted to share information::  Yes, Verbal Permission Granted  Name::     [place]::  SNF admissions  Relationship::     Contact Information:     Housing/Transportation Living arrangements for the past 2 months:  [place] of Information:  Adult Children, Spouse Patient Interpreter Needed:  None Criminal Activity/Legal Involvement Pertinent to Current Situation/Hospitalization:  No - Comment as needed Significant Relationships:  Adult Children, Spouse Lives with:  Facility Resident Do you feel safe going back to the place where you live?  No (Patient needs some short term rehab before returning to Memory Care ALF) Need for family participation in patient care:  Yes (Comment) (Patient has dementia)  Care giving concerns:  Patient has dementia and weakness, patient's family would like her to go to SNF for short term rehab in order to return back to memory care at [place].     Social Worker assessment / plan: Patient is a 82 year old female who is alert only x1.  Patient is a resident at [facility] unit.  Patient has dementia so assessment was not completed with patient but with patient's family member.  Patient's family member states she has not been to SNF for rehab before.  CSW explained to patient and her family what to expect with going to a SNF and also what to expect day of discharge to facility.  CSW explained to patient's family how insurance will pay for stay.  Patient was laying in her bed sleeping when CSW was speaking to patient's family in regards to going to SNF for short term  rehab.  Patient's family did not express any other questions.  Employment status:  Retired Nurse, adult PT Recommendations:  [place] / Referral to community resources:  [place]  Patient/Family's Response to care:  Patient and family agreeable to   Patient/Family's Understanding of and Emotional Response to Diagnosis, Current Treatment, and Prognosis:  Patient's family aware of prognosis and what patient will need in order to return back to ALF  Emotional Assessment Appearance:  Appears stated age Attitude/Demeanor/Rapport:    Affect (typically observed):  Appropriate, Calm Orientation:  Oriented to Self Alcohol / Substance use:  Not Applicable Psych involvement (Current and /or in the community):  No (Comment)  Discharge Needs  Concerns to be addressed:  Cognitive Concerns, Adjustment to Illness Readmission within the last 30 days:  No Current discharge risk:  Lack of support system, Cognitively Impaired Barriers to Discharge:  No Barriers Identified   Ross Ludwig, LCSWA 11/06/2015, 4:30pm

## 2015-11-06 NOTE — Clinical Social Work Placement (Addendum)
   CLINICAL SOCIAL WORK PLACEMENT  NOTE  Date:  11/06/2015  Patient Details  Name: Shannon Dunn MRN: [id number] Date of Birth: [birth year]  Clinical Social Work is seeking post-discharge placement for this patient at the [place] level of care (*CSW will initial, date and re-position this form in  chart as items are completed):  Yes   Patient/family provided with [place] Work Department's list of facilities offering this level of care within the geographic area requested by the patient (or if unable, by the patient's family).  Yes   Patient/family informed of their freedom to choose among providers that offer the needed level of care, that participate in Medicare, Medicaid or managed care program needed by the patient, have an available bed and are willing to accept the patient.  Yes   Patient/family informed of [facility]'s ownership interest in [facility] and [facility], as well as of the fact that they are under no obligation to receive care at these facilities.  PASRR submitted to EDS on 11/06/15     PASRR number received on       Existing PASRR number confirmed on       FL2 transmitted to all facilities in geographic area requested by pt/family on       FL2 transmitted to all facilities within larger geographic area on       Patient informed that his/her managed care company has contracts with or will negotiate with certain facilities, including the following:         11/08/15   Patient/family informed of bed offers received. (Updated 11/08/15 Evette Cristal, MSW, [place])  Patient chooses bed at  [facility] (Updated 11/08/15 Evette Cristal, MSW, [facility])     Physician recommends and patient chooses bed at      Patient to be transferred to  [facility] on   (Updated 11/08/15 Evette Cristal, MSW, LCSWA).  Patient to be transferred to facility by  [facility] (Updated 11/08/15 Evette Cristal, MSW, [place])     Patient family notified on  11/08/15 of transfer. (Updated 11/08/15 Evette Cristal, MSW, [place])  Name of family member notified:   Dolores Patty (Updated 11/08/15 Evette Cristal, MSW, LCSWA)     PHYSICIAN Please sign FL2 and DNR     Additional Comment:    _______________________________________________ Ross Ludwig, LCSWA 11/06/2015, 4:45 pm

## 2015-11-06 NOTE — NC FL2 (Signed)
[place] LEVEL OF CARE SCREENING TOOL     IDENTIFICATION  Patient Name: Shannon Dunn Birthdate: 1923/09/05 Sex: female Admission Date (Current Location): 11/04/2015  [facility] and [place] Number:  Herbalist and Address:  [facility], [place] [address], [place], [place] [address]      Provider Number: [id number]  Attending Physician Name and Address:  Tania Ade, MD  Relative Name and Phone Number:  [facility] Daughter [phone]    Current Level of Care: Hospital Recommended Level of Care: [place] Prior Approval Number:    Date Approved/Denied:   PASRR Number:    Discharge Plan: SNF    Current Diagnoses: Patient Active Problem List   Diagnosis Date Noted  . Protein-calorie malnutrition, severe 11/05/2015  . Femur fracture ([place]) 11/04/2015  . Hip fracture, left ([place]) 11/04/2015  . Chronic respiratory failure with hypoxia ([place]) 09/13/2015  . Physical deconditioning 07/19/2015  . Acute respiratory failure with hypoxia ([place]) 07/18/2015  . HCAP (healthcare-associated pneumonia) 07/16/2015  . Obstructive bronchiectasis ([place]) 04/29/2015  . H/O malignant neoplasm of skin 12/24/2014  . Cancer ([place]) 04/16/2014  . Mammary Pagets disease ([place]) 04/16/2014  . Cancer of nipple and areola of female breast ([place]) 04/16/2014  . Bronchiectasis without acute exacerbation ([place]) 03/24/2013  . Pernicious anemia   . Dementia   . Vitamin D deficiency   . Osteoporosis   . Parkinson's disease ([place])   . Hernia of anterior abdominal wall 03/02/2013  . H/O nutritional disorder 01/15/2013  . Personal history of nutritional deficiency 01/15/2013    Orientation RESPIRATION BLADDER Height & Weight     Self  O2 (1.5 L) Incontinent Weight: 82 lb 14.3 oz (37.6 kg) Height:     BEHAVIORAL SYMPTOMS/MOOD NEUROLOGICAL BOWEL NUTRITION STATUS      Continent Diet (regular)  AMBULATORY STATUS COMMUNICATION OF NEEDS Skin    Limited Assist Verbally Surgical wounds                       Personal Care Assistance Level of Assistance  Feeding, Bathing, Dressing Bathing Assistance: Limited assistance Feeding assistance: Limited assistance Dressing Assistance: Limited assistance     Functional Limitations Info             SPECIAL CARE FACTORS FREQUENCY  PT (By licensed PT), OT (By licensed OT)     PT Frequency: 5x a week OT Frequency: 5x a week            Contractures      Additional Factors Info  Allergies    Code Status   Allergies Info: ZITHROMAX, IODINE, IVP DYE, SHELLFISH ALLERGY  Patient is DNR         Current Medications (11/06/2015):  This is the current hospital active medication list Current Facility-Administered Medications  Medication Dose Route Frequency Provider Last Rate Last Dose  . 0.9 %  sodium chloride infusion   Intravenous Continuous Ritta Slot, NP 50 mL/hr at 11/06/15 2219    . acetaminophen (TYLENOL) tablet 650 mg  650 mg Oral Q6H PRN Grier Mitts, PA-C       Or  . acetaminophen (TYLENOL) suppository 650 mg  650 mg Rectal Q6H PRN Grier Mitts, PA-C   650 mg at 11/06/15 2120  . acidophilus (RISAQUAD) capsule 1 capsule  1 capsule Oral Daily Sid Falcon, MD   Stopped at 11/05/15 1000  . albuterol (PROVENTIL) (2.5 MG/3ML) 0.083% nebulizer solution 2.5 mg  2.5 mg  Nebulization BID Sid Falcon, MD   2.5 mg at 11/06/15 2054  . aspirin chewable tablet 324 mg  324 mg Oral BID Tania Ade, MD   81 mg at 11/06/15 2219  . budesonide (PULMICORT) nebulizer solution 0.25 mg  0.25 mg Nebulization Daily Waldemar Dickens, MD   0.25 mg at 11/06/15 0802  . carbidopa-levodopa (SINEMET IR) 25-100 MG per tablet immediate release 2 tablet  2 tablet Oral TID Tania Ade, MD   1 tablet at 11/06/15 2048  . enoxaparin (LOVENOX) injection 40 mg  40 mg Subcutaneous Q24H Sid Falcon, MD   40 mg at 11/06/15 2229  . famotidine (PEPCID) 40 MG/5ML suspension 20 mg   20 mg Oral BID Tania Ade, MD   20 mg at 11/06/15 2221  . feeding supplement (ENSURE ENLIVE) (ENSURE ENLIVE) liquid 237 mL  237 mL Oral TID BM Tania Ade, MD   237 mL at 11/05/15 1400  . fluticasone (FLONASE) 50 MCG/ACT nasal spray 1 spray  1 spray Each Nare Daily Sid Falcon, MD      . HYDROcodone-acetaminophen (NORCO/VICODIN) 5-325 MG per tablet 1 tablet  1 tablet Oral Q8H PRN Monica Becton, MD      . lactated ringers infusion   Intravenous Continuous Roderic Palau, MD      . levothyroxine (SYNTHROID, LEVOTHROID) tablet 25 mcg  25 mcg Oral QAC breakfast Sid Falcon, MD   25 mcg at 11/05/15 [phone]  . menthol-cetylpyridinium (CEPACOL) lozenge 3 mg  1 lozenge Oral PRN Grier Mitts, PA-C       Or  . phenol (CHLORASEPTIC) mouth spray 1 spray  1 spray Mouth/Throat PRN Grier Mitts, PA-C      . metoCLOPramide (REGLAN) tablet 5-10 mg  5-10 mg Oral Q8H PRN Grier Mitts, PA-C       Or  . metoCLOPramide (REGLAN) injection 5-10 mg  5-10 mg Intravenous Q8H PRN Grier Mitts, PA-C      . morphine 2 MG/ML injection 0.5 mg  0.5 mg Intravenous Q2H PRN Sid Falcon, MD      . ondansetron [facility]) tablet 4 mg  4 mg Oral Q6H PRN Grier Mitts, PA-C       Or  . ondansetron (ZOFRAN) injection 4 mg  4 mg Intravenous Q6H PRN Grier Mitts, PA-C      . piperacillin-tazobactam (ZOSYN) IVPB 2.25 g  2.25 g Intravenous Q8H Padmaja Vasireddy, MD   2.25 g at 11/06/15 2219  . rasagiline (AZILECT) tablet 1 mg  1 mg Oral Daily Sid Falcon, MD      . Rivastigmine PT24 13.3 mg  13.3 mg Transdermal Q24H Tania Ade, MD   13.3 mg at 11/06/15 1200  . rotigotine (NEUPRO) 4 MG/24HR 1 patch  1 patch Transdermal Q24H Sid Falcon, MD   1 patch at 11/06/15 1200  . senna-docusate (Senokot-S) tablet 1 tablet  1 tablet Oral QHS PRN Sid Falcon, MD         Discharge Medications: Please see discharge summary for a list of discharge medications.  Relevant Imaging  Results:  Relevant Lab Results:   Additional Information SSN: [id number]   Ross Ludwig, [place]

## 2015-11-06 NOTE — Progress Notes (Signed)
Occupational Therapy Evaluation Patient Details Name: Shannon Dunn MRN: [id number] DOB: Sep 21, 1923 Today's Date: 11/06/2015    History of Present Illness 82 y.o. female s/p cannulated hip screw fixation 4/20 after experiencing fall, with PMHx of PD, dementia,    Clinical Impression   PTA, pt lived in memory care unit at [facility] and was mod I with mobility and S for ADL. Pt lethargic on entry using face mask. Pt mobilized with +2 mod A to recliner. After sitting in chair, pt became alert and interactive with staff. Vitals stable during session. CNA present to observe mobility - recommend staff using [place] to assist with mobility. Recommend rehab at SNF prior to return to ALF. Will follow acutely to address established goals and facilitate D?C to SNF.     Follow Up Recommendations  SNF;Supervision/Assistance - 24 hour    Equipment Recommendations  None recommended by OT    Recommendations for Other Services       Precautions / Restrictions Precautions Precautions: Fall Precaution Comments: watch O2 Sats Restrictions Weight Bearing Restrictions: Yes LLE Weight Bearing: Weight bearing as tolerated      Mobility Bed Mobility Overal bed mobility: Needs Assistance;+2 for physical assistance Bed Mobility: Supine to Sit     Supine to sit: +2 for physical assistance;Total assist        Transfers Overall transfer level: Needs assistance Equipment used: 2 person hand held assist Transfers: Sit to/from [employer] Transfers Sit to Stand: Mod assist;+2 physical assistance Stand pivot transfers: Mod assist;+2 physical assistance  Pt attempting to step            Balance Overall balance assessment: Needs assistance   Sitting balance-Leahy Scale: Poor       Standing balance-Leahy Scale: Poor                              ADL Overall ADL's : Needs assistance/impaired Eating/Feeding: NPO   Grooming: Wash/dry hands;Wash/dry face;Set  up;Sitting   Upper Body Bathing: Moderate assistance;Sitting   Lower Body Bathing: Maximal assistance;Sit to/from stand   Upper Body Dressing : Maximal assistance;Sitting   Lower Body Dressing: Total assistance;Sit to/from stand   Toilet Transfer: +2 for physical assistance;Moderate assistance Toilet Transfer Details (indicate cue type and reason): simulated Toileting- Clothing Manipulation and Hygiene: Total assistance (foley)       Functional mobility during ADLs: +2 for physical assistance;Moderate assistance (HHA) General ADL Comments: B HHA with use of gait belt and bed pan for support. Once in chair, pt with eyes open and talking     Vision     Perception     Praxis      Pertinent Vitals/Pain Pain Assessment: Faces Faces Pain Scale: Hurts little more Pain Location: L hip Pain Descriptors / Indicators: Grimacing Pain Intervention(s): Limited activity within patient's tolerance     Hand Dominance Right   Extremity/Trunk Assessment Upper Extremity Assessment Upper Extremity Assessment: Generalized weakness   Lower Extremity Assessment Lower Extremity Assessment: Generalized weakness   Cervical / Trunk Assessment Cervical / Trunk Assessment: Kyphotic   Communication Communication Communication: No difficulties   Cognition Arousal/Alertness: Lethargic;Suspect due to medications (initially; then awake once sitting in chair) Behavior During Therapy: Flat affect Overall Cognitive Status: Impaired/Different from baseline                 General Comments: pt with baseline dementia. more lethargic and confused than [place]. Mostlikely related to meds/bedrest.   General  Comments       Exercises       Shoulder Instructions      Home Living Family/patient expects to be discharged to:: Assisted living                                 Additional Comments: Pt in memory care unit at [facility]. Funcitonal ambulator without AD.       Prior Functioning/Environment Level of Independence: Needs assistance  Gait / Transfers Assistance Needed: no DME for mobility, wanders facility and moves furniture ADL's / Homemaking Assistance Needed: staff assists with ADLs , pt self feeds; able to complete simple ADL, but needs assist for completing tasks        OT Diagnosis: Generalized weakness;Cognitive deficits;Acute pain   OT Problem List: Decreased strength;Decreased range of motion;Decreased activity tolerance;Impaired balance (sitting and/or standing);Decreased cognition;Decreased safety awareness;Decreased knowledge of use of DME or AE;Pain   OT Treatment/Interventions: Self-care/ADL training;Therapeutic exercise;DME and/or AE instruction;Therapeutic activities;Cognitive remediation/compensation;Patient/family education;Balance training    OT Goals(Current goals can be found in the care plan section) Acute Rehab OT Goals Patient Stated Goal: return to prior function OT Goal Formulation: With patient/family Time For Goal Achievement: 11/20/15 Potential to Achieve Goals: Good ADL Goals Pt Will Perform Grooming: with set-up;with supervision;sitting Pt Will Perform Upper Body Bathing: with set-up;with supervision;sitting Pt Will Transfer to Toilet: with min assist;with +2 assist;stand pivot transfer;bedside commode Additional ADL Goal #1: Bed mobility with min A in preparation for ADL  OT Frequency: Min 2X/week   Barriers to D/C:            Co-evaluation              End of Session Equipment Utilized During Treatment: Gait belt;Oxygen (face mask) Nurse Communication: Mobility status;Need for lift equipment;Weight bearing status;Precautions  Activity Tolerance: Patient tolerated treatment well Patient left: in chair;with call bell/phone within reach;with chair alarm set;with family/visitor present   Time: [id number] OT Time Calculation (min): 25 min Charges:  OT General Charges $OT Visit: 1 Procedure OT  Evaluation $OT Eval Moderate Complexity: 1 Procedure OT Treatments $Self Care/Home Management : 8-22 mins G-Codes:    Dan Scearce,H[place] 07-Nov-2015, 1:45 PM   [facility], OTR/L  [phone] 2015/11/07

## 2015-11-06 NOTE — Evaluation (Signed)
Clinical/Bedside Swallow Evaluation Patient Details  Name: Shannon Dunn MRN: [id number] Date of Birth: 09/25/1923  Today's Date: 11/06/2015 Time: SLP Start Time (ACUTE ONLY): 0808 SLP Stop Time (ACUTE ONLY): 0842 SLP Time Calculation (min) (ACUTE ONLY): 34 min  Past Medical History:  Past Medical History  Diagnosis Date  . Colon cancer ([place])   . Parkinson's disease ([place])   . Osteoporosis   . Vitamin D deficiency   . Dementia   . Ventral hernia   . Pernicious anemia   . COPD (chronic obstructive pulmonary disease) ([place])   . 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 Renal disorder     Renal insufficiency  . Paget's bone disease    Past Surgical History:  Past Surgical History  Procedure Laterality Date  . Colon surgery      bowel resection  . Breast surgery Left    HPI:  Shannon Dunn is an 82 yo F with medical history significant for Parkinson's Disease, Alzheimer's Dementia, bronchiectasis, colon cancer and now left hip fx due to unwitnessed fall at nursing home. Pt underwent cannulated hip screw fixation of left hip on 4/20. Most current CXR showingWorsening bilateral lower lobe infiltrates, suspicious for   Assessment / Plan / Recommendation Clinical Impression  Repeat clinical swallowing evaluation was completed.  The patient was more alert and interacitve today and followed some directions.  Of note, a rapid response was called on the patient last night due to desaturation of her oxygen while bed mobility was being performed.  She was initially on a non rebreather but has since been weaned to a venti mask with 50% oxygen.  Oral care was provided and spontaneous swallows were not observed.  However, the patient was able to volitionally swallow upon command.  Limited oral mechanism exam revealed good lingual range of motion.  PO's were trialed and the patient was noted to have oropharyngeal dysphagia characterized by decreased labial seal with anterior escape given liquids, delayed oral  transit, decreased awareness of the bolus and delayed swallow trigger.  Hyo-laryngeal excursion was judged to be mildly decreased.  Overt s/s of aspiration were not observed, however, the patient required mod-max cues to consistently swallow.  Recommend keeping the patient NPO except her Parkinson's medication crushed in a small amount of puree following oral care.  ST will follow up for PO readiness.      Aspiration Risk  Severe aspiration risk    Diet Recommendation   NPO except critical meds crushed in puree following oral care.  Medication Administration: Crushed with puree    Other  Recommendations Oral Care Recommendations: Oral care QID;Oral care before and after PO Other Recommendations: Have oral suction available   Follow up Recommendations  Skilled Nursing facility    Frequency and Duration min 2x/week  2 weeks       Prognosis Prognosis for Safe Diet Advancement: Good Barriers to Reach Goals: Cognitive deficits      Swallow Study   General Date of Onset: 11/04/15 HPI: Shannon Dunn is a 82 yo F with medical history significant for Parkinson's Disease, Alzheimer's Dementia, bronchiectasis, colon cancer and now left hip fx due to unwitnessed fall at nursing home. Pt underwent cannulated hip screw fixation of left hip on 4/20. Most current CXR showingWorsening bilateral lower lobe infiltrates, suspicious for Type of Study: Bedside Swallow Evaluation Previous Swallow Assessment: BSE yesterday Diet Prior to this Study: NPO Temperature Spikes Noted: Yes Respiratory Status: Venti-mask (50% weaned from non ebreather after rapid last night) History  of Recent Intubation: No Behavior/Cognition: Lethargic/Drowsy;Requires cueing Oral Cavity Assessment: Dry;Dried secretions Oral Care Completed by SLP: Yes Oral Cavity - Dentition: Adequate natural dentition;Missing dentition Vision: Impaired for self-feeding Self-Feeding Abilities: Total assist Patient Positioning: Partially  reclined Baseline Vocal Quality: Low vocal intensity Volitional Cough: Cognitively unable to elicit Volitional Swallow: Able to elicit    Oral/Motor/Sensory Function Overall Oral Motor/Sensory Function: Generalized oral weakness Facial Symmetry: Within Functional Limits Lingual ROM: Within Functional Limits Lingual Symmetry: Within Functional Limits Mandible: Within Functional Limits   Ice Chips Ice chips: Not tested   Thin Liquid Thin Liquid: Impaired Presentation: Spoon;Cup Oral Phase Impairments: Reduced labial seal;Poor awareness of bolus Oral Phase Functional Implications: Oral holding;Prolonged oral transit;Left anterior spillage Pharyngeal  Phase Impairments: Suspected delayed Swallow    Nectar Thick Nectar Thick Liquid: Not tested   Honey Thick Honey Thick Liquid: Not tested   Puree Puree: Impaired Presentation: Spoon Oral Phase Impairments: Poor awareness of bolus Oral Phase Functional Implications: Prolonged oral transit Pharyngeal Phase Impairments: Suspected delayed Swallow   Solid   GO   Solid: Not tested       Shelly Flatten, MA, CCC-SLP Acute Rehab SLP [phone] Shelly Flatten N 11/06/2015,9:00 AM

## 2015-11-06 NOTE — Progress Notes (Signed)
Pt's daughter is requesting to talk to CM or SW. Met with pt's daughter Shannon Dunn) at bedside. Shannon Dunn has POA. Pt resides at [facility] in the memory care unit. She is not sure if pt can return to facility. Her father also resides at the facility. Discussed with daughter SW notes. She would like to talk to [facility] [place]. Contacted Randall Hiss and made him aware of daughter's request. Sarah's cell phone # is [phone].

## 2015-11-06 NOTE — Progress Notes (Signed)
Subjective: 2 Days Post-Op Procedure(s) (LRB): CANNULATED HIP PINNING (Left) ARTHROPLASTY BIPOLAR HIP (HEMIARTHROPLASTY) (Left) Patient reports pain as mild. The patient's daughter is at the bedside. The patient became very confused yesterday after receiving a hydrocodone. Therefore her Foley catheter was left in place. She is a little more coherent this a.m. Hospitalists are managing her medical issues.   Objective: Vital signs in last 24 hours: Temp:  [98.3 F (36.8 C)-100.3 F (37.9 C)] 98.4 F (36.9 C) (04/22 0300) Pulse Rate:  [93-111] 98 (04/22 0300) Resp:  [18-32] 24 (04/21 2123) BP: (122-155)/(51-73) 129/51 mmHg (04/22 0300) SpO2:  [65 %-100 %] 100 % (04/22 0804) FiO2 (%):  [50 %] 50 % (04/22 0804)  Intake/Output from previous day: 04/21 0701 - 04/22 0700 In: 75 [P.O.:25; IV Piggyback:50] Out: -  Intake/Output this shift:     Recent Labs  11/04/15 0758 11/04/15 1404 11/05/15 0455 11/06/15 0642  HGB 12.6 10.8* 10.5* 10.1*    Recent Labs  11/05/15 0455 11/06/15 0642  WBC 7.2 11.1*  RBC 3.63* 3.48*  HCT 33.8* 32.8*  PLT 246 221    Recent Labs  11/05/15 0455 11/06/15 0642  NA 135 137  K 4.2 3.8  CL 98* 104  CO2 28 22  BUN 14 15  CREATININE 0.95 0.98  GLUCOSE 100* 74  CALCIUM 8.1* 7.4*    Recent Labs  11/04/15 0758  INR 1.15  Left hip exam: Small dressings are intact. She moves her left lower extremity without difficulty. Minimal pain through range of motion. Calves soft bilaterally.    Assessment/Plan: 2 Days Post-Op Procedure(s) (LRB): CANNULATED HIP PINNING (Left) ARTHROPLASTY BIPOLAR HIP (HEMIARTHROPLASTY) (Left)  Plan: Up with physical therapy WBAT on left. The patient had a cannulated hip pinning. She did not have a hemiarthroplasty. Aspirin 325 mg twice daily for DVT prophylaxis. Hold narcotics. Will need skilled nursing facility. Medical care per hospitalist. Should this patient be on hospitalist service? Foley is still in  place due to significant confusion yesterday. We need to be aggressive on getting this out soon. We will leave this up to the hospitalists. We will follow the patient.   Dynver Clemson G 11/06/2015, 8:51 AM

## 2015-11-06 NOTE — Progress Notes (Signed)
PROGRESS NOTE    JOREEN STINAR  [id number] DOB: May 13, 1924 DOA: 11/04/2015 PCP: Renata Caprice, DO (Confirm with patient/family/NH records and if not entered, this HAS to be entered at [facility] point of entry. "No PCP" if truly none.) Outpatient Specialists: (Specify speciality and name if known)    Brief Narrative: LAVERDA JOLIVETTE is a 82 y.o. 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 Report from nursing home is that she was seen in bed at 5am, but then was found down at [place] fall. She was complaining of hip pain in the ED and imaging revealed a hip fracture. She has dementia, history was attempted, but difficult to obtain.  ED Course: Imaging including head CT and neck CT were normal. Hip xray revealed a hip fracture.   Assessment & Plan:   Active Problems:   Bronchiectasis without acute exacerbation (HCC)   Dementia   Vitamin D deficiency   Osteoporosis   Parkinson's disease ([place])   Femur fracture ([place])   Hip fracture, left (HCC)   Protein-calorie malnutrition, severe   Femur fracture  - On the left - Pain control with morphine - Underwent pinning of the left hip  - Ortho recommended weightbearing as tolerated -ontinue the DVT prophylaxis with Lovenox  Altered mental status Metabolic encephalopathy from medication and a pneumonia Hold all sedative medication Keep nothing by mouth.  Pneumonia healthcare associated/aspiration Keep the patient on Zosyn and vancomycin duoNeb's Solu-Medrol  Bronchiectasis without acute exacerbation  - Continue home inhalers which include albuterol nebulizer and pulmicort.  - She is breathing comfortably, not requiring O2 when I saw her   Dementia - Continue rivastigmine patch - Avoid delerium with day/night awareness, avoiding sedating medications as possible, family support   Vitamin D  deficiency - Consider checking Vitamin D level with AML - Continue Vitamin D supplementation, 3000 IU daily   Osteoporosis - She is on denosumab, calcium and vitamin D. Continue supplementation after surgery. No need for denosumab today   Parkinson's disease  - Rotigotine, Rytary, rasagiline will be continued.     DVT prophylaxis: Lovenox Code Status: DO NOT RESUSCITATE Family Communication: Daughter at bedside Disposition Plan: skilled nursing facility  Consultants:   Orthopedic surgery   Procedures:   X-ray of the left hip: Acute left femur fracture    Subjective: Patient is quite somnolent. Followed simple commands. Last night rapid response was called for hypoxia. Chest x-ray showed bilateral infiltrates concerning about pneumonia Patient was started on oxygen nonrebreather and Zosyn  Objective: Filed Vitals:   11/05/15 1955 11/05/15 2123 11/06/15 0300 11/06/15 0804  BP:  137/61 129/51   Pulse:  99 98   Temp:  99.3 F (37.4 C) 98.4 F (36.9 C)   TempSrc:  Oral    Resp:  24    SpO2: 100% 100% 65% 100%    Intake/Output Summary (Last 24 hours) at 11/06/15 1232 Last data filed at 11/05/15 2041  Gross per 24 hour  Intake     50 ml  Output      0 ml  Net     50 ml   There were no vitals filed for this visit.  Examination:  General exam: Appears calm and comfortable, somnolent  Respiratory system: Bilateral quiet breath sounds diffusely Cardiovascular system: S1 & S2 heard, RRR. No JVD, murmurs, rubs, gallops or clicks. No pedal edema. Gastrointestinal system: Abdomen is nondistended, soft  and nontender. No organomegaly or masses felt. Normal bowel sounds heard. Central nervous system: somnolent. Could not examinemotor or sensory. Extremities: Symmetric 5 x 5 power. Skin: No rashes, lesions or ulcers Psychiatry: somnolent   Data Reviewed: I have personally reviewed following labs and imaging studies  CBC:  Recent Labs Lab 11/04/15 0758  11/04/15 1404 11/05/15 0455 11/06/15 0642  WBC 7.1 8.8 7.2 11.1*  NEUTROABS 5.3  --   --   --   HGB 12.6 10.8* 10.5* 10.1*  HCT 37.6 33.9* 33.8* 32.8*  MCV 90.2 91.9 93.1 94.3  PLT 309 278 246 [id number]   Basic Metabolic Panel:  Recent Labs Lab 11/04/15 0758 11/04/15 1404 11/05/15 0455 11/06/15 0642  NA 139  --  135 137  K 3.8  --  4.2 3.8  CL 104  --  98* 104  CO2 28  --  28 22  GLUCOSE 99  --  100* 74  BUN 20  --  14 15  CREATININE 0.89 0.92 0.95 0.98  CALCIUM 9.3  --  8.1* 7.4*   GFR: CrCl cannot be calculated (Unknown ideal weight.). Liver Function Tests: No results for input(s): AST, ALT, ALKPHOS, BILITOT, PROT, ALBUMIN in the last 168 hours. No results for input(s): LIPASE, AMYLASE in the last 168 hours. No results for input(s): AMMONIA in the last 168 hours. Coagulation Profile:  Recent Labs Lab 11/04/15 0758  INR 1.15   Cardiac Enzymes: No results for input(s): CKTOTAL, CKMB, CKMBINDEX, TROPONINI in the last 168 hours. BNP (last 3 results) No results for input(s): PROBNP in the last 8760 hours. HbA1C: No results for input(s): HGBA1C in the last 72 hours. CBG: No results for input(s): GLUCAP in the last 168 hours. Lipid Profile: No results for input(s): CHOL, HDL, LDLCALC, TRIG, CHOLHDL, LDLDIRECT in the last 72 hours. Thyroid Function Tests: No results for input(s): TSH, T4TOTAL, FREET4, T3FREE, THYROIDAB in the last 72 hours. Anemia Panel: No results for input(s): VITAMINB12, FOLATE, FERRITIN, TIBC, IRON, RETICCTPCT in the last 72 hours. Urine analysis:    Component Value Date/Time   COLORURINE YELLOW 04/09/2015 [place] 04/09/2015 1355   LABSPEC 1.019 04/09/2015 1355   PHURINE 6.5 04/09/2015 1355   GLUCOSEU NEGATIVE 04/09/2015 1355   HGBUR NEGATIVE 04/09/2015 1355   [place] 04/09/2015 1355   KETONESUR NEGATIVE 04/09/2015 1355   PROTEINUR NEGATIVE 04/09/2015 1355   UROBILINOGEN 0.2 04/09/2015 1355   NITRITE NEGATIVE  04/09/2015 1355   LEUKOCYTESUR NEGATIVE 04/09/2015 1355   Sepsis Labs: @LABRCNTIP (procalcitonin:4,lacticidven:4)  ) Recent Results (from the past 240 hour(s))  Surgical pcr screen     Status: None   Collection Time: 11/04/15  2:15 PM  Result Value Ref Range Status   MRSA, PCR NEGATIVE NEGATIVE Final   Staphylococcus aureus NEGATIVE NEGATIVE Final    Comment:        The Xpert SA Assay (FDA approved for NASAL specimens in patients over 48 years of age), is one component of a comprehensive surveillance program.  Test performance has been validated by [facility] for patients greater than or equal to 61 year old. It is not intended to diagnose infection nor to guide or monitor treatment.          Radiology Studies: Dg Chest Port 1 View  11/05/2015  CLINICAL DATA:  Wheezing and chest congestion. EXAM: PORTABLE CHEST 1 VIEW COMPARISON:  11/04/2015 and 08/13/2015 FINDINGS: Worsening infiltrates are seen in both lower lung zones, suspicious for pneumonia. Low lung volumes are  again noted. No evidence of pleural effusion. Heart size is within normal limits. No pneumothorax visualized. IMPRESSION: Worsening bilateral lower lobe infiltrates, suspicious for pneumonia. Electronically Signed   By: Earle Gell M.D.   On: 11/05/2015 19:23   Dg Hip Operative Unilat With Pelvis Left  11/04/2015  CLINICAL DATA:  Left hip pinning, subcapital femoral neck fracture EXAM: OPERATIVE left HIP (WITH PELVIS IF PERFORMED) 1 VIEWS TECHNIQUE: Fluoroscopic spot image(s) were submitted for interpretation post-operatively. COMPARISON:  Film from earlier in the same day FINDINGS: Three fixation screws are noted traversing the left femoral neck into the femoral head. The overall appearance is stable from the prior exam. IMPRESSION: Status post left hip pinning. Please see the operative report for further details regarding fluoroscopy time Electronically Signed   By: Inez Catalina M.D.   On: 11/04/2015 17:13         Scheduled Meds: . acidophilus  1 capsule Oral Daily  . albuterol  2.5 mg Nebulization BID  . aspirin EC  325 mg Oral BID  . budesonide  0.25 mg Nebulization Daily  . carbidopa-levodopa  2 tablet Oral TID  . enoxaparin (LOVENOX) injection  40 mg Subcutaneous Q24H  . famotidine  20 mg Oral BID  . feeding supplement (ENSURE ENLIVE)  237 mL Oral TID BM  . fluticasone  1 spray Each Nare Daily  . levothyroxine  25 mcg Oral QAC breakfast  . piperacillin-tazobactam (ZOSYN)  IV  2.25 g Intravenous Q8H  . rasagiline  1 mg Oral Daily  . Rivastigmine  13.3 mg Transdermal Q24H  . rotigotine  1 patch Transdermal Q24H   Continuous Infusions: . sodium chloride 50 mL/hr at 11/06/15 0442  . lactated ringers       LOS: 2 days    Time spent: 28min   Aliyah Abeyta, MD [facility] Pager [phone]  If 7PM-7AM, please contact night-coverage [email or web address] Password [facility] 11/06/2015, 12:32 PM

## 2015-11-06 NOTE — Clinical Social Work Note (Signed)
CSW met with patient and her family to discuss SNF process and options.  Patient's family feels she needs to go to a SNF before she can return back to [facility].  Patient's family would like to go to [facility], CSW contacted [facility] who will review patient's information and decide if they can take her or not.  CSW to work patient up for SNF placement.  Jones Broom. [place], MSW, [place] 11/06/2015 5:38 PM

## 2015-11-07 LAB — CBC
HCT: 30.9 % — ABNORMAL LOW (ref 36.0–46.0)
HEMOGLOBIN: 9.5 g/dL — AB (ref 12.0–15.0)
MCH: 28.9 pg (ref 26.0–34.0)
MCHC: 30.7 g/dL (ref 30.0–36.0)
MCV: 93.9 fL (ref 78.0–100.0)
Platelets: 239 10*3/uL (ref 150–400)
RBC: 3.29 MIL/uL — ABNORMAL LOW (ref 3.87–5.11)
RDW: 14.9 % (ref 11.5–15.5)
WBC: 7.1 10*3/uL (ref 4.0–10.5)

## 2015-11-07 MED ORDER — RESOURCE THICKENUP CLEAR PO POWD
ORAL | Status: DC | PRN
  Filled 2015-11-07: qty 125

## 2015-11-07 NOTE — Progress Notes (Signed)
Speech Language Pathology Treatment: Dysphagia  Patient Details Name: Shannon Dunn MRN: [id number] DOB: 21-Apr-1924 Today's Date: 11/07/2015 Time: [id number] SLP Time Calculation (min) (ACUTE ONLY): 17 min  Assessment / Plan / Recommendation Clinical Impression  Pt seen today for dysphagia treatment/ PO trials. Pt alert and conversant today. No overt s/s of aspiration noted with puree consistencies. No overt s/s noted with thin liquid trials; however, did notice a soft crackly breathing sound shortly after this trial. Across consistencies, suspect mildly delayed initiation in swallow, multiple swallows, and reduced hyolaryngeal excursion. Aspiration risk appears high with regular thin liquids. Recommend initiating dysphagia 1 diet, pudding-thick liquids via teaspoon, meds crushed in puree, full supervision during meals. Also recommend proceeding with MBS on next date in order to objectively evaluate swallow function.   HPI HPI: Shannon Dunn is a 82 yo F with medical history significant for Parkinson's Disease, Alzheimer's Dementia, bronchiectasis, colon cancer and now left hip fx due to unwitnessed fall at nursing home. Pt underwent cannulated hip screw fixation of left hip on 4/20. Most current CXR showingWorsening bilateral lower lobe infiltrates, suspicious for      SLP Plan  Continue with current plan of care     Recommendations  Diet recommendations: Dysphagia 1 (puree);Pudding-thick liquid Liquids provided via: Teaspoon Medication Administration: Crushed with puree Supervision: Patient able to self feed;Full supervision/cueing for compensatory strategies Compensations: Slow rate;Small sips/bites Postural Changes and/or Swallow Maneuvers: Seated upright 90 degrees             Oral Care Recommendations: Oral care BID Follow up Recommendations: Skilled Nursing facility Plan: Continue with current plan of care     [place], Amy K, [place], CCC-SLP 11/07/2015,  4:06 PM [phone]

## 2015-11-07 NOTE — Progress Notes (Signed)
PROGRESS NOTE    Shannon Dunn  [id number] DOB: 06-19-1924 DOA: 11/04/2015 PCP: Renata Caprice, DO ( Outpatient Specialists: [facility] speciality and name if known)    Brief Narrative: Shannon Dunn is a 82 y.o. 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 Report from nursing home is that she was seen in bed at 5am, but then was found down at [place] fall. She was complaining of hip pain in the ED and imaging revealed a hip fracture. She has dementia, history was attempted, but difficult to obtain.  ED Course: Imaging including head CT and neck CT were normal. Hip xray revealed a hip fracture.  Post surgical period Patient developed pneumonia most likely aspiration. Currently on Zosyn, breathing treatments and steroids with a significant improvement.  Assessment & Plan:   Active Problems:   Bronchiectasis without acute exacerbation (HCC)   Dementia   Vitamin D deficiency   Osteoporosis   Parkinson's disease ([place])   Femur fracture ([place])   Hip fracture, left (HCC)   Protein-calorie malnutrition, severe   Femur fracture  - On the left - Underwent pinning of the left hip  - Ortho recommended weightbearing as tolerated -continue the DVT prophylaxis with Lovenox -plan to discharge to skilled nursing facility  Altered mental status Metabolic encephalopathy from medication and a pneumonia Continues to have good improvement   Pneumonia healthcare associated/aspiration Keep the patient on Zosyn and vancomycin duoNeb's Solu-Medrol Patient's oxygen requirement significantly decreased to 1 L through nasal cannula Swallow evaluation is pending    Dementia - Continue rivastigmine patch - Avoid delerium with day/night awareness, avoiding sedating medications as possible, family support   Vitamin D deficiency - Consider checking Vitamin D  level with AML - Continue Vitamin D supplementation, 3000 IU daily   Osteoporosis - She is on denosumab, calcium and vitamin D. Continue supplementation after surgery. No need for denosumab today   Parkinson's disease  - Rotigotine, Rytary, rasagiline will be continued.     DVT prophylaxis: Lovenox Code Status: DO NOT RESUSCITATE Family Communication: Daughter at bedside Disposition Plan: skilled nursing facility  Consultants:   Orthopedic surgery   Procedures:   X-ray of the left hip: Acute left femur fracture    Subjective: Patient is better oriented today Denied any shortness of breath. Wants to eat  Objective: Filed Vitals:   11/06/15 1456 11/06/15 2030 11/06/15 2054 11/07/15 0812  BP: 129/49 136/63    Pulse: 96 101    Temp: 99 F (37.2 C) 99 F (37.2 C)    TempSrc:  Oral    Resp: 19 18    Weight:      SpO2: 99% 96% 97% 99%    Intake/Output Summary (Last 24 hours) at 11/07/15 1424 Last data filed at 11/07/15 0700  Gross per 24 hour  Intake 1667.5 ml  Output      0 ml  Net 1667.5 ml   Filed Weights   11/06/15 1300  Weight: 37.6 kg (82 lb 14.3 oz)    Examination:  General exam: Appears calm and comfortable, somnolent  Respiratory system: bilateral good air entry with improved wheezing Cardiovascular system: S1 & S2 heard, RRR. No JVD, murmurs, rubs, gallops or clicks. No pedal edema. Gastrointestinal system: Abdomen is nondistended, soft and nontender. No organomegaly or masses felt. Normal bowel sounds heard. Central nervous system: somnolent. Could not examinemotor or sensory. Extremities: Symmetric 5 x 5  power. Skin: No rashes, lesions or ulcers Psychiatry: somnolent   Data Reviewed: I have personally reviewed following labs and imaging studies  CBC:  Recent Labs Lab 11/04/15 0758 11/04/15 1404 11/05/15 0455 11/06/15 0642 11/07/15 0324  WBC 7.1 8.8 7.2 11.1* 7.1  NEUTROABS 5.3  --   --   --   --   HGB 12.6 10.8* 10.5* 10.1*  9.5*  HCT 37.6 33.9* 33.8* 32.8* 30.9*  MCV 90.2 91.9 93.1 94.3 93.9  PLT 309 278 246 221 [id number]   Basic Metabolic Panel:  Recent Labs Lab 11/04/15 0758 11/04/15 1404 11/05/15 0455 11/06/15 0642  NA 139  --  135 137  K 3.8  --  4.2 3.8  CL 104  --  98* 104  CO2 28  --  28 22  GLUCOSE 99  --  100* 74  BUN 20  --  14 15  CREATININE 0.89 0.92 0.95 0.98  CALCIUM 9.3  --  8.1* 7.4*   GFR: Estimated Creatinine Clearance: 23.1 mL/min (by C-G formula based on Cr of 0.98). Liver Function Tests: No results for input(s): AST, ALT, ALKPHOS, BILITOT, PROT, ALBUMIN in the last 168 hours. No results for input(s): LIPASE, AMYLASE in the last 168 hours. No results for input(s): AMMONIA in the last 168 hours. Coagulation Profile:  Recent Labs Lab 11/04/15 0758  INR 1.15   Cardiac Enzymes: No results for input(s): CKTOTAL, CKMB, CKMBINDEX, TROPONINI in the last 168 hours. BNP (last 3 results) No results for input(s): PROBNP in the last 8760 hours. HbA1C: No results for input(s): HGBA1C in the last 72 hours. CBG: No results for input(s): GLUCAP in the last 168 hours. Lipid Profile: No results for input(s): CHOL, HDL, LDLCALC, TRIG, CHOLHDL, LDLDIRECT in the last 72 hours. Thyroid Function Tests: No results for input(s): TSH, T4TOTAL, FREET4, T3FREE, THYROIDAB in the last 72 hours. Anemia Panel: No results for input(s): VITAMINB12, FOLATE, FERRITIN, TIBC, IRON, RETICCTPCT in the last 72 hours. Urine analysis:    Component Value Date/Time   COLORURINE YELLOW 04/09/2015 [place] 04/09/2015 1355   LABSPEC 1.019 04/09/2015 1355   PHURINE 6.5 04/09/2015 1355   GLUCOSEU NEGATIVE 04/09/2015 1355   HGBUR NEGATIVE 04/09/2015 1355   [place] 04/09/2015 1355   KETONESUR NEGATIVE 04/09/2015 1355   PROTEINUR NEGATIVE 04/09/2015 1355   UROBILINOGEN 0.2 04/09/2015 1355   NITRITE NEGATIVE 04/09/2015 1355   LEUKOCYTESUR NEGATIVE 04/09/2015 1355   Sepsis  Labs: @LABRCNTIP (procalcitonin:4,lacticidven:4)  ) Recent Results (from the past 240 hour(s))  Surgical pcr screen     Status: None   Collection Time: 11/04/15  2:15 PM  Result Value Ref Range Status   MRSA, PCR NEGATIVE NEGATIVE Final   Staphylococcus aureus NEGATIVE NEGATIVE Final    Comment:        The Xpert SA Assay (FDA approved for NASAL specimens in patients over 42 years of age), is one component of a comprehensive surveillance program.  Test performance has been validated by [facility] for patients greater than or equal to 64 year old. It is not intended to diagnose infection nor to guide or monitor treatment.          Radiology Studies: Dg Chest Port 1 View  11/05/2015  CLINICAL DATA:  Wheezing and chest congestion. EXAM: PORTABLE CHEST 1 VIEW COMPARISON:  11/04/2015 and 08/13/2015 FINDINGS: Worsening infiltrates are seen in both lower lung zones, suspicious for pneumonia. Low lung volumes are again noted. No evidence of pleural effusion. Heart size  is within normal limits. No pneumothorax visualized. IMPRESSION: Worsening bilateral lower lobe infiltrates, suspicious for pneumonia. Electronically Signed   By: Earle Gell M.D.   On: 11/05/2015 19:23        Scheduled Meds: . acidophilus  1 capsule Oral Daily  . albuterol  2.5 mg Nebulization BID  . aspirin  324 mg Oral BID  . budesonide  0.25 mg Nebulization Daily  . carbidopa-levodopa  2 tablet Oral TID  . enoxaparin (LOVENOX) injection  40 mg Subcutaneous Q24H  . famotidine  20 mg Oral BID  . feeding supplement (ENSURE ENLIVE)  237 mL Oral TID BM  . fluticasone  1 spray Each Nare Daily  . levothyroxine  25 mcg Oral QAC breakfast  . piperacillin-tazobactam (ZOSYN)  IV  2.25 g Intravenous Q8H  . rasagiline  1 mg Oral Daily  . Rivastigmine  13.3 mg Transdermal Q24H  . rotigotine  1 patch Transdermal Q24H   Continuous Infusions: . sodium chloride 50 mL/hr at 11/06/15 2300  . lactated ringers        LOS: 3 days    Time spent: 30min   Kaydan Wilhoite, MD [facility] Pager [phone]  If 7PM-7AM, please contact night-coverage [email or web address] Password [facility] 11/07/2015, 2:24 PM

## 2015-11-07 NOTE — Progress Notes (Signed)
Subjective: 3 Days Post-Op Procedure(s) (LRB): CANNULATED HIP PINNING (Left) ARTHROPLASTY BIPOLAR HIP (HEMIARTHROPLASTY) (Left) Patient reports pain as mild. The patient is doing much better today. Her daughter reports that she has been sitting up in the chair.   Objective: Vital signs in last 24 hours: Temp:  [99 F (37.2 C)] 99 F (37.2 C) (04/22 2030) Pulse Rate:  [101] 101 (04/22 2030) Resp:  [18] 18 (04/22 2030) BP: (136)/(63) 136/63 mmHg (04/22 2030) SpO2:  [96 %-99 %] 99 % (04/23 0812)  Intake/Output from previous day: 04/22 0701 - 04/23 0700 In: 1667.5 [I.V.:1667.5] Out: -  Intake/Output this shift: Total I/O In: -  Out: 300 [Urine:300]   Recent Labs  11/05/15 0455 11/06/15 0642 11/07/15 0324  HGB 10.5* 10.1* 9.5*    Recent Labs  11/06/15 0642 11/07/15 0324  WBC 11.1* 7.1  RBC 3.48* 3.29*  HCT 32.8* 30.9*  PLT 221 239    Recent Labs  11/05/15 0455 11/06/15 0642  NA 135 137  K 4.2 3.8  CL 98* 104  CO2 28 22  BUN 14 15  CREATININE 0.95 0.98  GLUCOSE 100* 74  CALCIUM 8.1* 7.4*   No results for input(s): LABPT, INR in the last 72 hours. Left hip exam: Small dressing is benign. Minimal pain with range of motion of left hip. No significant swelling. Left calf is soft and nontender. SCDs are in place. The patient's Foley catheter is still in place.  Assessment/Plan: 3 Days Post-Op status post pinning of nondisplaced left femoral neck fracture with cannulated screws 3. Plan: Discontinue Foley today. Will probably be ready for discharge to skilled nursing facility tomorrow. Continue physical therapy. WBAT on left. VTE prophylaxis: Currently on Lovenox in hospital per hospitalist. At discharge we would prefer aspirin 325 mg enteric-coated twice a day 1 month postop. She will need a follow-up with Dr. Tamera Punt in 2 weeks.  [place] G 11/07/2015, 5:16 PM

## 2015-11-08 ENCOUNTER — Inpatient Hospital Stay (HOSPITAL_COMMUNITY): Payer: Medicare Other

## 2015-11-08 DIAGNOSIS — S72002S Fracture of unspecified part of neck of left femur, sequela: Secondary | ICD-10-CM

## 2015-11-08 DIAGNOSIS — J690 Pneumonitis due to inhalation of food and vomit: Secondary | ICD-10-CM

## 2015-11-08 DIAGNOSIS — F0280 Dementia in other diseases classified elsewhere without behavioral disturbance: Secondary | ICD-10-CM

## 2015-11-08 DIAGNOSIS — F0390 Unspecified dementia without behavioral disturbance: Secondary | ICD-10-CM

## 2015-11-08 DIAGNOSIS — G20 Parkinson's disease: Secondary | ICD-10-CM

## 2015-11-08 MED ORDER — LEVOTHYROXINE SODIUM 25 MCG PO TABS: 25.0000 ug | ORAL_TABLET | Freq: Every day | ORAL | Status: DC

## 2015-11-08 MED ORDER — FAMOTIDINE 40 MG/5ML PO SUSR: 20.0000 mg | Freq: Two times a day (BID) | ORAL | Status: DC

## 2015-11-08 MED ORDER — AMOXICILLIN-POT CLAVULANATE 875-125 MG PO TABS
1.0000 | ORAL_TABLET | Freq: Two times a day (BID) | ORAL | Status: DC
  Administered 2015-11-08: 1 via ORAL
  Filled 2015-11-08: qty 1

## 2015-11-08 MED ORDER — RISAQUAD PO CAPS: 1.0000 | ORAL_CAPSULE | Freq: Every day | ORAL | Status: DC

## 2015-11-08 MED ORDER — SENNOSIDES-DOCUSATE SODIUM 8.6-50 MG PO TABS: 1.0000 | ORAL_TABLET | Freq: Every evening | ORAL | Status: DC | PRN

## 2015-11-08 MED ORDER — RESOURCE THICKENUP CLEAR PO POWD: 1.0000 | ORAL | Status: DC | PRN

## 2015-11-08 MED ORDER — AMOXICILLIN-POT CLAVULANATE 875-125 MG PO TABS: 1.0000 | ORAL_TABLET | Freq: Two times a day (BID) | ORAL | Status: DC

## 2015-11-08 NOTE — Care Management Note (Signed)
Case Management Note  Patient Details  Name: Shannon Dunn MRN: [id number] Date of Birth: June 22, 1924                  Action/Plan: Discharge Planning: AVS reviewed: Scheduled dc to SNF. CSW following for SNF placement.   Expected Discharge Date:  11/08/2015               Expected Discharge Plan:  Skilled Nursing Facility  In-House Referral:  Clinical Social Work  Discharge planning Services  CM Consult  Post Acute Care Choice:  NA Choice offered to:  NA  DME Arranged:  N/A DME Agency:  NA  HH Arranged:  NA HH Agency:  NA  Status of Service:  Completed, signed off  Medicare Important Message Given:  Yes Date Medicare IM Given:    Medicare IM give by:    Date Additional Medicare IM Given:    Additional Medicare Important Message give by:     If discussed at [place] of Stay Meetings, dates discussed:    Additional Comments:  Erenest Rasher, RN 11/08/2015, 5:06 PM

## 2015-11-08 NOTE — Progress Notes (Signed)
Occupational Therapy Treatment Patient Details Name: PAMILA URBAIN MRN: [id number] DOB: August 25, 1923 Today's Date: 11/08/2015    History of present illness 82 y.o. female s/p cannulated hip screw fixation 4/20 after experiencing fall, with PMHx of PD, dementia,    OT comments  Pt required +2 Mod A to step to [facility] and took @ 8 steps back to chair with +2 Mod A. Posterior lean throughout. Grooming with set up.  Husband present. Continue to recommend SNF for rehab.  Follow Up Recommendations  SNF;Supervision/Assistance - 24 hour    Equipment Recommendations  None recommended by OT    Recommendations for Other Services      Precautions / Restrictions Precautions Precautions: Fall Precaution Comments: watch O2 Sats Restrictions LLE Weight Bearing: Weight bearing as tolerated       Mobility Bed Mobility     General bed mobility comments: OOB in chair  Transfers Overall transfer level: Needs assistance   Transfers: Sit to/from Stand;Stand Pivot Transfers Sit to Stand: Mod assist;+2 physical assistance Stand pivot transfers: Mod assist;+2 physical assistance Squat pivot transfers: Mod assist;+2 safety/equipment     General transfer comment: Pt initiates stand from [place] Overall balance assessment: Needs assistance   Sitting balance-Leahy Scale: Fair       Standing balance-Leahy Scale: Zero                     ADL Overall ADL's : Needs assistance/impaired     Grooming: Wash/dry hands;Wash/dry face;Set up;Sitting                   Toilet Transfer: +2 for physical assistance;Moderate assistance;BSC   Toileting- Clothing Manipulation and Hygiene: Total assistance    Verbalized she needed to have a BM     General ADL Comments: Pt took 3 steps to [facility]. Returned to chair taking @ 8 steps with +2 Mod A      Vision                     Perception     Praxis      Cognition   Behavior During Therapy: WFL for tasks  assessed/performed Overall Cognitive Status: Impaired/Different from baseline Area of Impairment: Orientation;Memory;Following commands;Safety/judgement;Problem solving Orientation Level: Disoriented to;Time;Situation;Place Current Attention Level: Focused Memory: Decreased short-term memory  Following Commands: Follows one step commands inconsistently;Follows one step commands with increased time     Problem Solving: Slow processing General Comments: Pt alert and cooperative. Remembered therapists name initially. Pt distracted at times by O2 Sat monitor on hand. Tangential conversation at times    Extremity/Trunk Assessment   BUE generalized weakness            Exercises     Shoulder Instructions       General Comments      Pertinent Vitals/ Pain       Pain Assessment: Faces Faces Pain Scale: Hurts a little bit Pain Location: L hip Pain Descriptors / Indicators: Discomfort Pain Intervention(s): Limited activity within patient's tolerance  Home Living Family/patient expects to be discharged to:: [place], OTR/L  [id number] 4/24/2017Prior Functioning/Environment              Frequency Min 2X/week  Progress Toward Goals  OT Goals(current goals can now be found in the care plan section)  Progress towards OT goals: Progressing toward goals  Acute Rehab OT Goals Patient Stated Goal: return to prior function OT Goal Formulation: With patient/family Time For Goal Achievement: 11/20/15 Potential to Achieve Goals: Good ADL Goals Pt Will Perform Grooming: with set-up;with supervision;sitting Pt Will Perform Upper Body Bathing: with set-up;with supervision;sitting Pt Will Transfer to Toilet: with min assist;with +2 assist;stand pivot transfer;bedside commode Additional ADL Goal #1: Bed mobility with min A in preparation for ADL  Plan Discharge plan remains appropriate     Co-evaluation                 End of Session Equipment Utilized During Treatment: Gait belt   Activity Tolerance Patient tolerated treatment well   Patient Left in chair;with call bell/phone within reach;with chair alarm set;with family/visitor present   Nurse Communication Mobility status        Time: 1350-1415 OT Time Calculation (min): 25 min  Charges: OT General Charges $OT Visit: 1 Procedure OT Treatments $Self Care/Home Management : 23-37 mins  Klayton Monie,[place] 11/08/2015, 3:32 PM

## 2015-11-08 NOTE — Progress Notes (Signed)
   PATIENT ID: Shannon Dunn   4 Days Post-Op Procedure(s) (LRB): CANNULATED HIP PINNING (Left) ARTHROPLASTY BIPOLAR HIP (HEMIARTHROPLASTY) (Left)  Subjective: Doing okay this morning. PT transferred to a chair over the weekend and nurse noted they are coming by again today. Swallow eval just performed and limited to liquids. Denies hip pain.  Objective:  Filed Vitals:   11/07/15 2117 11/08/15 0533  BP: 148/60 150/63  Pulse: 103 93  Temp: 98.2 F (36.8 C) 98 F (36.7 C)  Resp: 18 18     L hip dressing c/d/i Wiggles toes, calf soft, nontender  Labs:   Recent Labs  11/06/15 0642 11/07/15 0324  HGB 10.1* 9.5*   Recent Labs  11/06/15 0642 11/07/15 0324  WBC 11.1* 7.1  RBC 3.48* 3.29*  HCT 32.8* 30.9*  PLT 221 239   Recent Labs  11/06/15 0642  NA 137  K 3.8  CL 104  CO2 22  BUN 15  CREATININE 0.98  GLUCOSE 74  CALCIUM 7.4*    Assessment and Plan: 4 Days Post-Op status post pinning of nondisplaced left femoral neck fracture with cannulated screws 3. She did not get a hemiarthroplasty discharge to skilled nursing facility when medically stable per hospitalist team Continue physical therapy. WBAT on left. VTE prophylaxis: Currently on Lovenox in hospital per hospitalist.  At discharge we would prefer aspirin 325 mg enteric-coated twice a day 1 month postop, unless lovenox medically indicated She will need a follow-up with Dr. Tamera Punt in 2 weeks.

## 2015-11-08 NOTE — Progress Notes (Signed)
OT Cancellation Note  Patient Details Name: CERENITI DETEMPLE MRN: [id number] DOB: 04/24/1924   Cancelled Treatment:    Reason Eval/Treat Not Completed: Patient at procedure or test/ unavailable (at [facility]. Will attempt to see later.)  [facility] 11/08/2015, 11:46 AM

## 2015-11-08 NOTE — Progress Notes (Signed)
Physical Therapy Treatment Patient Details Name: Shannon Dunn MRN: [id number] DOB: 07/16/1924 Today's Date: 11/08/2015    History of Present Illness 82 y.o. female s/p cannulated hip screw fixation 4/20 after experiencing fall, with PMHx of PD, dementia,     PT Comments    Pt awake and alert on arrival. Pt with hallucinations throughout the day per spouse as she was looking for book not present on arrival. With initiation of mobility noted pt to be incontinent of stool. Rolled with assist for pericare in bed prior to standing with continued incontinence throughout transfers and pivots with increased time and total assist for pericare and linen change multiple times throughout session. Pt with sats 88-90 % on RA with return to 1.5L at end of session with sats 94%. Pt with improved function and mobility from eval with spouse present and educated throughout. Will continue to follow with continued recommendation for SNF.   Follow Up Recommendations  SNF;Supervision/Assistance - 24 hour     Equipment Recommendations       Recommendations for Other Services       Precautions / Restrictions Precautions Precautions: Fall Precaution Comments: watch O2 Sats Restrictions LLE Weight Bearing: Weight bearing as tolerated    Mobility  Bed Mobility Overal bed mobility: Needs Assistance Bed Mobility: Rolling;Sidelying to Sit Rolling: Mod assist Sidelying to sit: Max assist       General bed mobility comments: multimodal cues with hand over hand assist to reach for rail with LUE, assist to bend and move LLE and rotate pelvis to roll. Assist to bring legs fully to EOB and elevate trunk   Transfers Overall transfer level: Needs assistance   Transfers: Sit to/from Stand;Squat Pivot Transfers Sit to Stand: Max assist;+2 safety/equipment Stand pivot transfers: +2 safety/equipment Squat pivot transfers: Max assist;+2 safety/equipment     General transfer comment: pt able to stand from  bed/BSC and chair with max assist for anterior translation and rise from surface. hand over hand placement for hand position. Pt stood from bed x 2 with pt incontinent of bowel and bladder on second stand with pivot to chair, pivot to [facility] with increased time in standing for repeated pericare before final pivot back to chair. Pt stood grossly 6 times with assist.   Ambulation/Gait                 Stairs            Wheelchair Mobility    Modified Rankin (Stroke Patients Only)       Balance Overall balance assessment: Needs assistance   Sitting balance-Leahy Scale: Poor       Standing balance-Leahy Scale: Zero                      Cognition Arousal/Alertness: Awake/alert Behavior During Therapy: WFL for tasks assessed/performed Overall Cognitive Status: Impaired/Different from baseline Area of Impairment: Orientation;Memory;Following commands;Safety/judgement;Problem solving Orientation Level: Disoriented to;Time;Situation;Place Current Attention Level: Focused Memory: Decreased short-term memory Following Commands: Follows one step commands inconsistently;Follows one step commands with increased time     Problem Solving: Slow processing General Comments: pt with baseline dementia with pt awake and alert, conversant throughout but non-sensical most of the time    Exercises      General Comments        Pertinent Vitals/Pain Pain Assessment:  (PAINAD= 1)    Home Living  Prior Function            PT Goals (current goals can now be found in the care plan section) Progress towards PT goals: Progressing toward goals    Frequency       PT Plan Current plan remains appropriate    Co-evaluation             End of Session Equipment Utilized During Treatment: Gait belt Activity Tolerance: Patient tolerated treatment well Patient left: in chair;with call bell/phone within reach;with chair alarm set;with  family/visitor present     Time: [id number] PT Time Calculation (min) (ACUTE ONLY): 44 min  Charges:  $Therapeutic Activity: 8-22 mins $Self Care/Home Management: 23-37                    G Codes:      Melford Aase Nov 24, 2015, 2:35 PM Elwyn Reach, [place]

## 2015-11-08 NOTE — Progress Notes (Signed)
MBSS complete. Full report located under chart review in imaging section. Pam Vanalstine, MA CCC-SLP [phone]  

## 2015-11-08 NOTE — Care Management Important Message (Signed)
Important Message  Patient Details  Name: Shannon Dunn MRN: [id number] Date of Birth: 09/30/1923   Medicare Important Message Given:  Yes     [place] P [place] 11/08/2015, 1:23 PM

## 2015-11-08 NOTE — Clinical Social Work Note (Signed)
CSW received phone call from patient's family who were presented bed offers and have agreed to have patient go to [facility] for short term rehab.    Patient to be d/c'ed today to [facility].  Patient and family agreeable to plans will transport via ems RN to call report. Evette Cristal, MSW, [place]

## 2015-11-08 NOTE — Discharge Summary (Signed)
Physician Discharge Summary  Shannon Dunn [id number] DOB: [birth year] DOA: 11/04/2015  PCP: Renata Caprice, DO  Admit date: 11/04/2015 Discharge date: 11/08/2015  Recommendations for Outpatient Follow-up: SLP Diet Recommendations Dysphagia 2 (Fine chop) solids;Thin liquid Liquid Administration via Cup;Straw Medication Administration Crushed with puree Compensations Multiple dry swallows after each bite/sip Postural Changes Seated upright at 90 degrees  Continue Augmentin for 10 days on discharge for pneumonia. Continue aspirin 325 mg twice daily for 1 month (through 12/08/2015) then may continue once a day regimen 81 mg daily.   Discharge Diagnoses:  Active Problems:   Bronchiectasis without acute exacerbation (HCC)   Dementia   Vitamin D deficiency   Osteoporosis   Parkinson's disease ([place])   Femur fracture ([place])   Hip fracture, left ([place])   Protein-calorie malnutrition, severe    Discharge Condition: stable   Diet recommendation: dysphagia 2 as indicated above   History of present illness:   Per brief narrative 11/07/2015  "82 y.o. 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 Report from nursing home is that she was seen in bed at 5am, but then was found down at [place] fall. She was complaining of hip pain in the ED and imaging revealed a hip fracture. She has dementia, history was attempted, but difficult to obtain.  ED Course: Imaging including head CT and neck CT were normal. Hip xray revealed a hip fracture.  Post surgical period Patient developed pneumonia most likely aspiration. Currently on Zosyn, breathing treatments and steroids with a significant improvement."  Hospital Course:   Left femoral neck fracture / Mechanical fall  - Underwent left hip pinning - Okay for discharge to SNF per ortho - She will continue  aspirin 325 mg BID for 1 month postoperatively - PT to continue in SNF - Continue pain management efforts   Acute metabolic encephalopathy / Parkinson dementia  - At baseline mental status - Stable   HCAP / Aspiration pneumonitis - She will continue Augmentin on discharge for 10 days - Dysphagia 2 per SLP evaluation - She was on vanco and zosyn in hospital  - No need for steroids on discharge   Osteoporosis - She is on denosumab, calcium and vitamin D.    Parkinson's disease  - Rytary, rivastigmine on discharge    DVT prophylaxis: Lovenox Code Status: DNR/DNI Family Communication: Husband at the bedside this am   Consultants:   Orthopedic surgery  Procedures:  Left hip pinning   Signed:  Leisa Lenz, MD  [facility] 11/08/2015, 1:44 PM  Pager #: [phone]  Time spent in minutes: more than 30 minutes   Discharge Exam: Filed Vitals:   11/07/15 2117 11/08/15 0533  BP: 148/60 150/63  Pulse: 103 93  Temp: 98.2 F (36.8 C) 98 F (36.7 C)  Resp: 18 18   Filed Vitals:   11/08/15 0100 11/08/15 0533 11/08/15 0742 11/08/15 0743  BP:  150/63    Pulse:  93    Temp:  98 F (36.7 C)    TempSrc:  Oral    Resp:  18    Weight:      SpO2: 99% 100% 100% 100%    General: Pt is alert, follows commands appropriately, not in acute distress Cardiovascular: Regular rate and rhythm, S1/S2 + Respiratory: Clear to auscultation bilaterally, no wheezing, no crackles, no rhonchi Abdominal: Soft, non tender, non distended, bowel sounds +, no guarding Extremities:  no edema, no cyanosis, pulses palpable bilaterally DP and PT Neuro: Grossly nonfocal  Discharge Instructions  Discharge Instructions    Call MD for:  difficulty breathing, headache or visual disturbances    Complete by:  As directed      Call MD for:  persistant dizziness or light-headedness    Complete by:  As directed      Call MD for:  persistant nausea and vomiting    Complete by:  As  directed      Call MD for:  severe uncontrolled pain    Complete by:  As directed      Diet general    Complete by:  As directed   SLP Diet Recommendations Dysphagia 2 (Fine chop) solids;Thin liquid Liquid Administration via Cup;Straw Medication Administration Crushed with puree Compensations Multiple dry swallows after each bite/sip Postural Changes Seated upright at 90 degrees     Discharge instructions    Complete by:  As directed   Continue Augmentin for 10 days on discharge for pneumonia. Continue aspirin 325 mg twice daily for 1 month (through 12/08/2015) then may continue once a day regimen 81 mg daily.     Increase activity slowly    Complete by:  As directed      Weight bearing as tolerated    Complete by:  As directed             Medication List    STOP taking these medications        ibuprofen 400 MG tablet  Commonly known as:  ADVIL,MOTRIN      TAKE these medications        acidophilus Caps capsule  Take 1 capsule by mouth daily.     ACIDOPHILUS PLUS PECTIN Tabs  Take 1 tablet by mouth daily.     albuterol (5 MG/ML) 0.5% nebulizer solution  Commonly known as:  PROVENTIL  Take 0.5 mLs (2.5 mg total) by nebulization 2 (two) times daily.     amoxicillin-clavulanate 875-125 MG tablet  Commonly known as:  AUGMENTIN  Take 1 tablet by mouth every 12 (twelve) hours.     aspirin 325 MG EC tablet  Take 1 tablet (325 mg total) by mouth 2 (two) times daily.     AZILECT 1 MG Tabs tablet  Generic drug:  rasagiline  Take 1 mg by mouth daily. 1400.     BENEFIBER PO  Take 1 scoop by mouth daily.     budesonide 0.25 MG/2ML nebulizer solution  Commonly known as:  PULMICORT  Take 0.25 mg by nebulization daily.     CALTRATE 600 PLUS-VIT D PO  Take 1 tablet by mouth 2 (two) times daily.     cyanocobalamin 1000 MCG/ML injection  Commonly known as:  (VITAMIN B-12)  Inject 1,000 mcg into the muscle every 30 (thirty) days.     denosumab 60 MG/ML Soln  injection  Commonly known as:  PROLIA  Inject 60 mg into the skin every 6 (six) months. Administer in upper arm, thigh, or abdomen     DIABETIC TUSSIN EX PO  Take 5 mLs by mouth every 6 (six) hours as needed (congestion/cough.).     ENSURE PLUS Liqd  Take 237 mLs by mouth 2 (two) times daily between meals.     EXELON 13.3 MG/24HR Pt24  Generic drug:  Rivastigmine  Place 1 patch onto the skin daily.     famotidine 40 MG/5ML suspension  Commonly known as:  PEPCID  Take 2.5 mLs (20 mg  total) by mouth 2 (two) times daily.     fluticasone 50 MCG/ACT nasal spray  Commonly known as:  FLONASE  Place 1 spray into both nostrils daily.     FLUTTER Devi  Use at least 4 times a day     HYDROcodone-acetaminophen 5-325 MG tablet  Commonly known as:  NORCO/VICODIN  Take 1-2 tablets by mouth every 6 (six) hours as needed for moderate pain.     levothyroxine 25 MCG tablet  Commonly known as:  SYNTHROID, LEVOTHROID  Take 1 tablet (25 mcg total) by mouth daily before breakfast.     loperamide 2 MG capsule  Commonly known as:  IMODIUM  Take 2 mg by mouth daily as needed for diarrhea or loose stools.     NEUPRO 4 MG/24HR  Generic drug:  rotigotine  Place 1 patch onto the skin daily.     ranitidine 75 MG tablet  Commonly known as:  ZANTAC  Take 75 mg by mouth 2 (two) times daily.     RESOURCE THICKENUP CLEAR Powd  Take 120 g by mouth as needed.     RYTARY 48.75-195 MG Cpcr  Generic drug:  Carbidopa-Levodopa ER  Take 1 tablet by mouth 3 (three) times daily.     senna-docusate 8.6-50 MG tablet  Commonly known as:  Senokot-S  Take 1 tablet by mouth at bedtime as needed for mild constipation.           Follow-up Information    Call [facility].   Why:  You are active with [facility] for physical therapy - Call them to restart services   Contact information:   [place] [address] [phone]       Follow up with Nita Sells, MD.  Schedule an appointment as soon as possible for a visit in 2 weeks.   Specialty:  Orthopedic Surgery   Contact information:   [address] [place] [address] [phone]       Follow up with Renata Caprice, DO. Schedule an appointment as soon as possible for a visit in 1 week.   Specialty:  Family Medicine   Why:  Follow up appt after recent hospitalization   Contact information:   [address] [place] [address] [phone]        The results of significant diagnostics from this hospitalization (including imaging, microbiology, ancillary and laboratory) are listed below for reference.    Significant Diagnostic Studies: Dg Chest 1 View  11/08/2015  CLINICAL DATA:  SOB, congestion for several days. History of pneumonia. EXAM: CHEST  1 VIEW COMPARISON:  11/05/2015 FINDINGS: Progressive coarse airspace consolidation at the left lung base. Little change in the milder coarse interstitial and airspace opacities at the right lung base. Heart size difficult to assess due to adjacent opacities. Low lung volumes. Blunting of the left lateral costophrenic angle suggesting small effusion. Coarse perihilar interstitial markings right worse than left. Atheromatous aorta. No pneumothorax. Residual oral contrast material in the nondistended stomach. Mild spondylitic changes in the thoracolumbar spine. IMPRESSION: 1. Asymmetric airspace disease, progressive in the left lower lung since previous exam Electronically Signed   By: Lucrezia Europe M.D.   On: 11/08/2015 13:04   Dg Chest 2 View  11/04/2015  CLINICAL DATA:  Preop.  Hip fracture. EXAM: CHEST  2 VIEW COMPARISON:  08/13/2015 FINDINGS: Coarse parenchymal changes in the left lower lung zone are stable. Mild patchy density has developed at the right lung base  likely related to volume loss. Lungs remain hyperaerated. No pneumothorax. Chronic appearing deformity of the right humeral head is noted. IMPRESSION: Chronic changes  in the left lung. Atelectasis at the right base. Electronically Signed   By: Marybelle Killings M.D.   On: 11/04/2015 08:07   Ct Head Wo Contrast  11/04/2015  CLINICAL DATA:  Fall this morning, Per Dr. Keturah Shavers notes: History of Parkinson's disease, dementia, COPD, She presents from [facility] screen this morning after being found on the floor at around 6 AM. According to staff they had seen her in bed at 5 a.m. EXAM: CT HEAD WITHOUT CONTRAST CT CERVICAL SPINE WITHOUT CONTRAST TECHNIQUE: Multidetector CT imaging of the head and cervical spine was performed following the standard protocol without intravenous contrast. Multiplanar CT image reconstructions of the cervical spine were also generated. COMPARISON:  Head CT 04/09/2015 FINDINGS: CT HEAD FINDINGS No intracranial hemorrhage. No parenchymal contusion. No midline shift or mass effect. Basilar cisterns are patent. No skull base fracture. No fluid in the paranasal sinuses or mastoid air cells. Orbits are normal. There are periventricular and subcortical white matter hypodensities. Generalized cortical atrophy. CT CERVICAL SPINE FINDINGS No prevertebral soft tissue swelling. Normal alignment of cervical vertebral bodies. No loss of vertebral body height. Normal facet articulation. Normal craniocervical junction. Mild joint space narrowing in the lower cervical spine with endplate sclerosis No evidence epidural or paraspinal hematoma. There is fine airspace opacities in the RIGHT upper lobe. IMPRESSION: 1. No intracranial trauma. 2. No cervical spine fracture. 3. Fine airspace disease in the RIGHT upper lobe with differential including mild edema versus less likely infection. Electronically Signed   By: Suzy Bouchard M.D.   On: 11/04/2015 08:12   Ct Cervical Spine Wo Contrast  11/04/2015  CLINICAL DATA:  Fall this morning, Per Dr. Keturah Shavers notes: History of Parkinson's disease, dementia, COPD, She presents from [facility] screen this morning after being found on the floor  at around 6 AM. According to staff they had seen her in bed at 5 a.m. EXAM: CT HEAD WITHOUT CONTRAST CT CERVICAL SPINE WITHOUT CONTRAST TECHNIQUE: Multidetector CT imaging of the head and cervical spine was performed following the standard protocol without intravenous contrast. Multiplanar CT image reconstructions of the cervical spine were also generated. COMPARISON:  Head CT 04/09/2015 FINDINGS: CT HEAD FINDINGS No intracranial hemorrhage. No parenchymal contusion. No midline shift or mass effect. Basilar cisterns are patent. No skull base fracture. No fluid in the paranasal sinuses or mastoid air cells. Orbits are normal. There are periventricular and subcortical white matter hypodensities. Generalized cortical atrophy. CT CERVICAL SPINE FINDINGS No prevertebral soft tissue swelling. Normal alignment of cervical vertebral bodies. No loss of vertebral body height. Normal facet articulation. Normal craniocervical junction. Mild joint space narrowing in the lower cervical spine with endplate sclerosis No evidence epidural or paraspinal hematoma. There is fine airspace opacities in the RIGHT upper lobe. IMPRESSION: 1. No intracranial trauma. 2. No cervical spine fracture. 3. Fine airspace disease in the RIGHT upper lobe with differential including mild edema versus less likely infection. Electronically Signed   By: Suzy Bouchard M.D.   On: 11/04/2015 08:12   Dg Chest Port 1 View  11/05/2015  CLINICAL DATA:  Wheezing and chest congestion. EXAM: PORTABLE CHEST 1 VIEW COMPARISON:  11/04/2015 and 08/13/2015 FINDINGS: Worsening infiltrates are seen in both lower lung zones, suspicious for pneumonia. Low lung volumes are again noted. No evidence of pleural effusion. Heart size is within normal limits. No pneumothorax visualized. IMPRESSION: Worsening bilateral  lower lobe infiltrates, suspicious for pneumonia. Electronically Signed   By: Earle Gell M.D.   On: 11/05/2015 19:23   Dg Swallowing Func-speech  Pathology  11/08/2015  Objective Swallowing Evaluation: Type of Study: MBS-Modified Barium Swallow Study Patient Details Name: Shannon Dunn MRN: [id number] Date of Birth: [birth year] Today's Date: 11/08/2015 Time: SLP Start Time (ACUTE ONLY): 1050-SLP Stop Time (ACUTE ONLY): 1120 SLP Time Calculation (min) (ACUTE ONLY): 30 min Past Medical History: Past Medical History Diagnosis Date . Colon cancer ([place])  . Parkinson's disease ([place])  . Osteoporosis  . Vitamin D deficiency  . Dementia  . Ventral hernia  . Pernicious anemia  . COPD (chronic obstructive pulmonary disease) ([place])  . 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 Renal disorder    Renal insufficiency . Paget's bone disease  Past Surgical History: Past Surgical History Procedure Laterality Date . Colon surgery     bowel resection . Breast surgery Left  HPI: Willo Damon is a 82 yo F with medical history significant for Parkinson's Disease, Alzheimer's Dementia, bronchiectasis, colon cancer and now left hip fx due to unwitnessed fall at nursing home. Pt underwent cannulated hip screw fixation of left hip on 4/20. Most current CXR showingWorsening bilateral lower lobe infiltrates, suspicious for Subjective: The patient was seent sitting partially reclined in bed with her daughter present who is a Software engineer.  Assessment / Plan / Recommendation CHL IP CLINICAL IMPRESSIONS 11/08/2015 Therapy Diagnosis Mild oral phase dysphagia;Mild pharyngeal phase dysphagia Clinical Impression Pt demonstrates mild oral deficits likely associated with Parkinson's, exisitng at baseline per family including prolonged mastication and brief oral holding. More significant are mild oral residuals that fall to the pharynx post swallow and pose a risk fo aspiraiton after the swallow. A cued second swallow clear this and otherwise oropahryngeal strength is WNL. Pt did have one instance of sensed aspiration before the swallow on the initial sip, due to impaired timing. Futher trials and consecutive  swallows with a straw did not result in any further aspriation events. Given pts decondiitoning and baseline Parkinsons, pt is at increased risk of aspiration.  Recommend strict aspiration precautions with Dys 2/thin liquid diet including up to chair for meals, full supervision to swallow twice and crushed meds due to appearance of difficulty transiting barium tablet through the GE junction. Discussed precautions with pts husband. Will plan to f/u for tolerance.  Impact on safety and function --   CHL IP TREATMENT RECOMMENDATION 11/08/2015 Treatment Recommendations Therapy as outlined in treatment plan below   Prognosis 11/08/2015 Prognosis for Safe Diet Advancement Good Barriers to Reach Goals Cognitive deficits Barriers/Prognosis Comment -- CHL IP DIET RECOMMENDATION 11/08/2015 SLP Diet Recommendations Dysphagia 2 (Fine chop) solids;Thin liquid Liquid Administration via Cup;Straw Medication Administration Crushed with puree Compensations Multiple dry swallows after each bite/sip Postural Changes Seated upright at 90 degrees   CHL IP OTHER RECOMMENDATIONS 11/08/2015 Recommended Consults -- Oral Care Recommendations Oral care BID Other Recommendations --   CHL IP FOLLOW UP RECOMMENDATIONS 11/08/2015 Follow up Recommendations Skilled Nursing facility   [facility] IP FREQUENCY AND DURATION 11/08/2015 Speech Therapy Frequency (ACUTE ONLY) min 2x/week Treatment Duration 2 weeks      CHL IP ORAL PHASE 11/08/2015 Oral Phase Impaired Oral - Pudding Teaspoon -- Oral - Pudding Cup -- Oral - Honey Teaspoon -- Oral - Honey Cup -- Oral - Nectar Teaspoon -- Oral - Nectar Cup -- Oral - Nectar Straw -- Oral - Thin Teaspoon -- Oral - Thin Cup Decreased bolus cohesion;Lingual/palatal residue;Delayed oral transit  Oral - Thin Straw Lingual/palatal residue;Decreased bolus cohesion;Delayed oral transit Oral - Puree Lingual/palatal residue;Decreased bolus cohesion Oral - Mech Soft Lingual/palatal residue;Decreased bolus cohesion;Delayed oral transit  Oral - Regular -- Oral - Multi-Consistency -- Oral - Pill -- Oral Phase - Comment --  CHL IP PHARYNGEAL PHASE 11/08/2015 Pharyngeal Phase Impaired Pharyngeal- Pudding Teaspoon -- Pharyngeal -- Pharyngeal- Pudding Cup -- Pharyngeal -- Pharyngeal- Honey Teaspoon -- Pharyngeal -- Pharyngeal- Honey Cup -- Pharyngeal -- Pharyngeal- Nectar Teaspoon -- Pharyngeal -- Pharyngeal- Nectar Cup -- Pharyngeal -- Pharyngeal- Nectar Straw -- Pharyngeal -- Pharyngeal- Thin Teaspoon -- Pharyngeal -- Pharyngeal- Thin Cup Delayed swallow initiation-pyriform sinuses;Penetration/Aspiration before swallow;WFL Pharyngeal Material enters airway, passes BELOW cords and not ejected out despite cough attempt by patient Pharyngeal- Thin Straw Pharyngeal residue - valleculae;Pharyngeal residue - pyriform Pharyngeal -- Pharyngeal- Puree Pharyngeal residue - valleculae;Pharyngeal residue - pyriform Pharyngeal -- Pharyngeal- Mechanical Soft -- Pharyngeal -- Pharyngeal- Regular WFL Pharyngeal -- Pharyngeal- Multi-consistency -- Pharyngeal -- Pharyngeal- Pill WFL Pharyngeal -- Pharyngeal Comment --  No flowsheet data found. No flowsheet data found. Herbie Baltimore, MA CCC-SLP [phone] Othelia Pulling Katherene Ponto 11/08/2015, 12:31 PM              Dg Hip Operative Unilat With Pelvis Left  11/04/2015  CLINICAL DATA:  Left hip pinning, subcapital femoral neck fracture EXAM: OPERATIVE left HIP (WITH PELVIS IF PERFORMED) 1 VIEWS TECHNIQUE: Fluoroscopic spot image(s) were submitted for interpretation post-operatively. COMPARISON:  Film from earlier in the same day FINDINGS: Three fixation screws are noted traversing the left femoral neck into the femoral head. The overall appearance is stable from the prior exam. IMPRESSION: Status post left hip pinning. Please see the operative report for further details regarding fluoroscopy time Electronically Signed   By: Inez Catalina M.D.   On: 11/04/2015 17:13   Dg Hip Unilat With Pelvis 2-3 Views Left  11/04/2015   CLINICAL DATA:  Fall at nursing home today with left hip pain EXAM: DG HIP (WITH OR WITHOUT PELVIS) 2-3V LEFT COMPARISON:  None. FINDINGS: Mildly to moderately impacted fracture left femoral neck. Pelvic bones intact. IMPRESSION: Acute left femur fracture Electronically Signed   By: Skipper Cliche M.D.   On: 11/04/2015 07:22    Microbiology: Recent Results (from the past 240 hour(s))  Surgical pcr screen     Status: None   Collection Time: 11/04/15  2:15 PM  Result Value Ref Range Status   MRSA, PCR NEGATIVE NEGATIVE Final   Staphylococcus aureus NEGATIVE NEGATIVE Final    Comment:        The Xpert SA Assay (FDA approved for NASAL specimens in patients over 61 years of age), is one component of a comprehensive surveillance program.  Test performance has been validated by [facility] for patients greater than or equal to 37 year old. It is not intended to diagnose infection nor to guide or monitor treatment.      Labs: Basic Metabolic Panel:  Recent Labs Lab 11/04/15 0758 11/04/15 1404 11/05/15 0455 11/06/15 0642  NA 139  --  135 137  K 3.8  --  4.2 3.8  CL 104  --  98* 104  CO2 28  --  28 22  GLUCOSE 99  --  100* 74  BUN 20  --  14 15  CREATININE 0.89 0.92 0.95 0.98  CALCIUM 9.3  --  8.1* 7.4*   Liver Function Tests: No results for input(s): AST, ALT, ALKPHOS, BILITOT, PROT, ALBUMIN in the last 168 hours. No results  for input(s): LIPASE, AMYLASE in the last 168 hours. No results for input(s): AMMONIA in the last 168 hours. CBC:  Recent Labs Lab 11/04/15 0758 11/04/15 1404 11/05/15 0455 11/06/15 0642 11/07/15 0324  WBC 7.1 8.8 7.2 11.1* 7.1  NEUTROABS 5.3  --   --   --   --   HGB 12.6 10.8* 10.5* 10.1* 9.5*  HCT 37.6 33.9* 33.8* 32.8* 30.9*  MCV 90.2 91.9 93.1 94.3 93.9  PLT 309 278 246 221 239   Cardiac Enzymes: No results for input(s): CKTOTAL, CKMB, CKMBINDEX, TROPONINI in the last 168 hours. BNP: BNP (last 3 results)  Recent Labs   04/09/15 0953  BNP 98.0    ProBNP (last 3 results) No results for input(s): PROBNP in the last 8760 hours.  CBG: No results for input(s): GLUCAP in the last 168 hours.

## 2015-11-08 NOTE — Discharge Instructions (Signed)
Discharge Instructions after Hip Surgery ° ° °You can bear weight and ambulate as tolerated on both legs °Use ice on the hip intermittently over the first 48 hours after surgery.  °Pain medicine has been prescribed for you.  °Use your medicine liberally over the first 48 hours, and then you can begin to taper your use. You may take Extra Strength Tylenol or Tylenol only in place of the pain pills. DO NOT take ANY nonsteroidal anti-inflammatory pain medications: Advil, Motrin, Ibuprofen, Aleve, Naproxen or Naprosyn.  °Take aspirin or anticoagulant medication as prescribed for 2 weeks after surgery. Please notify if allergic or sensitivity to aspirin. °You may remove your dressing after two days.  °You may shower 5 days after surgery. The incisions CANNOT get wet prior to 5 days. Simply allow the water to wash over the site and then pat dry. Do not rub the incisions. ° ° ° °Please call [phone] during normal business hours or [phone] after hours for any problems. Including the following: ° °- excessive redness of the incisions °- drainage for more than 4 days °- fever of more than 101.5 F ° °*Please note that pain medications will not be refilled after hours or on weekends. ° °

## 2015-11-09 ENCOUNTER — Encounter (HOSPITAL_COMMUNITY): Payer: Self-pay | Admitting: Orthopedic Surgery

## 2015-11-09 NOTE — Progress Notes (Signed)
MRN: [id number] Name: Shannon Dunn  Sex: female Age: 82 y.o. DOB: [birth year]  [place] #: [facility] Facility/Room:108 Level Of Care: SNF Provider: Inocencio Homes MD Emergency Contacts: Extended Emergency Contact Information Primary Emergency Contact: Gregor Hams, [place] [address] Johnnette Litter of [place] Phone: [phone] Relation: Daughter Secondary Emergency Contact: Clapper,Paul R Address: [place] Johnnette Litter of [place] Phone: [phone] Relation: Spouse  Code Status: FullCode  Allergies: Zithromax; Iodine; Ivp dye; and Shellfish allergy  Chief Complaint  Patient presents with  . New Admit To SNF    HPI: Patient is 82 y.o. 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 Report from nursing home is that she was seen in bed at 5am, but then was found down at [place] fall. She was complaining of hip pain in the ED.  Imaging including head CT and neck CT were normal. Hip xray revealed a hip fracture.Pt was admitted to [facility] from 4/20-24 where she underwent surgical repair of hip. Hospital course was complicated by  pneumonia most likely aspiration and acute encephalopathy. Pt is admitted to [facility] for OT/PT. While at SNF pt will be followed  For parkinson's dx, tx with rytary and rivastigmine, osteoporosis tx with denosumab, calcium and Vit D and hypothyroidism,tx with synthroid.  Past Medical History  Diagnosis Date  . Colon cancer ([place])   . Parkinson's disease ([place])   . Osteoporosis   . Vitamin D deficiency   . Dementia   . Ventral hernia   . Pernicious anemia   . COPD (chronic obstructive pulmonary disease) ([place])   . Pagets disease, breast ([place])   . Renal disorder     Renal insufficiency  . Paget's bone disease     Past Surgical History  Procedure Laterality Date  . Colon surgery      bowel resection  . Breast  surgery Left   . Hip pinning,cannulated Left 11/04/2015    Procedure: CANNULATED HIP PINNING;  Surgeon: Tania Ade, MD;  Location: [place];  Service: Orthopedics;  Laterality: Left;  . Hip arthroplasty Left 11/04/2015    Procedure: ARTHROPLASTY BIPOLAR HIP (HEMIARTHROPLASTY);  Surgeon: Tania Ade, MD;  Location: [place];  Service: Orthopedics;  Laterality: Left;      Medication List       This list is accurate as of: 11/10/15 11:59 PM.  Always use your most recent med list.               acidophilus Caps capsule  Take 1 capsule by mouth daily.     ACIDOPHILUS PLUS PECTIN Tabs  Take 1 tablet by mouth daily.     albuterol (5 MG/ML) 0.5% nebulizer solution  Commonly known as:  PROVENTIL  Take 0.5 mLs (2.5 mg total) by nebulization 2 (two) times daily.     amoxicillin-clavulanate 875-125 MG tablet  Commonly known as:  AUGMENTIN  Take 1 tablet by mouth every 12 (twelve) hours.     aspirin 325 MG EC tablet  Take 1 tablet (325 mg total) by mouth 2 (two) times daily.     AZILECT 1 MG Tabs tablet  Generic drug:  rasagiline  Take 1 mg by mouth daily. 1400.     BENEFIBER PO  Take 1 scoop by mouth daily.     budesonide 0.25 MG/2ML nebulizer solution  Commonly known as:  PULMICORT  Take 0.25 mg by nebulization daily.     CALTRATE 600 PLUS-VIT D PO  Take 1 tablet by mouth 2 (two) times daily.     cyanocobalamin 1000 MCG/ML injection  Commonly known as:  (VITAMIN B-12)  Inject 1,000 mcg into the muscle every 30 (thirty) days.     denosumab 60 MG/ML Soln injection  Commonly known as:  PROLIA  Inject 60 mg into the skin every 6 (six) months. Administer in upper arm, thigh, or abdomen     DIABETIC TUSSIN EX PO  Take 5 mLs by mouth every 6 (six) hours as needed (congestion/cough.).     ENSURE PLUS Liqd  Take 237 mLs by mouth 2 (two) times daily between meals.     EXELON 13.3 MG/24HR Pt24  Generic drug:  Rivastigmine  Place 1 patch onto the skin daily.     famotidine  40 MG/5ML suspension  Commonly known as:  PEPCID  Take 2.5 mLs (20 mg total) by mouth 2 (two) times daily.     fluticasone 50 MCG/ACT nasal spray  Commonly known as:  FLONASE  Place 1 spray into both nostrils daily.     FLUTTER Devi  Use at least 4 times a day     HYDROcodone-acetaminophen 5-325 MG tablet  Commonly known as:  NORCO/VICODIN  Take 1-2 tablets by mouth every 6 (six) hours as needed for moderate pain.     levothyroxine 25 MCG tablet  Commonly known as:  SYNTHROID, LEVOTHROID  Take 1 tablet (25 mcg total) by mouth daily before breakfast.     loperamide 2 MG capsule  Commonly known as:  IMODIUM  Take 2 mg by mouth daily as needed for diarrhea or loose stools.     NEUPRO 4 MG/24HR  Generic drug:  rotigotine  Place 1 patch onto the skin daily.     ranitidine 75 MG tablet  Commonly known as:  ZANTAC  Take 75 mg by mouth 2 (two) times daily.     RESOURCE THICKENUP CLEAR Powd  Take 120 g by mouth as needed.     RYTARY 48.75-195 MG Cpcr  Generic drug:  Carbidopa-Levodopa ER  Take 1 tablet by mouth 3 (three) times daily.     senna-docusate 8.6-50 MG tablet  Commonly known as:  Senokot-S  Take 1 tablet by mouth at bedtime as needed for mild constipation.        No orders of the defined types were placed in this encounter.    Immunization History  Administered Date(s) Administered  . Influenza,inj,Quad PF,36+ Mos 08/13/2015    Social History  Substance Use Topics  . Smoking status: Never Smoker   . Smokeless tobacco: Never Used  . Alcohol Use: Yes     Comment: glass of wine maybe once a week    Family history is + HD  Review of Systems  DATA OBTAINED: from patient, nurse GENERAL:  no fevers, fatigue, appetite changes SKIN: No itching, rash or wounds EYES: No eye pain, redness, discharge EARS: No earache, tinnitus, change in hearing NOSE: No congestion, drainage or bleeding  MOUTH/THROAT: No mouth or tooth pain, No sore throat RESPIRATORY: No  cough, wheezing, SOB CARDIAC: No chest pain, palpitations, lower extremity edema  GI: No abdominal pain, No N/V/D or constipation, No heartburn or reflux  GU: No dysuria, frequency or urgency, or incontinence  MUSCULOSKELETAL: No unrelieved bone/joint pain NEUROLOGIC: No headache, dizziness or focal weakness PSYCHIATRIC: No c/o anxiety or sadness  Filed Vitals:   11/14/15 1522  BP: 150/63  Pulse: 93  Temp: 98 F (36.7 C)  Resp: 18    SpO2 Readings from Last 1 Encounters:  11/08/15 99%        Physical Exam  GENERAL APPEARANCE: Alert,   No acute distress.  SKIN: No diaphoresis rash HEAD: Normocephalic, atraumatic  EYES: Conjunctiva/lids clear. Pupils round, reactive. EOMs intact.  EARS: External exam WNL, canals clear. Hearing grossly normal.  NOSE: No deformity or discharge.  MOUTH/THROAT: Lips w/o lesions  RESPIRATORY: Breathing is even, unlabored. Lung sounds are clear   CARDIOVASCULAR: Heart RRR no murmurs, rubs or gallops. No peripheral edema.   GASTROINTESTINAL: Abdomen is soft, non-tender, not distended w/ normal bowel sounds. GENITOURINARY: Bladder non tender, not distended  MUSCULOSKELETAL: l leg contractures NEUROLOGIC:  Cranial nerves 2-12 grossly intact. Moves all extremities  PSYCHIATRIC: Mood and affect appropriate to situation with dementia, no behavioral issues  Patient Active Problem List   Diagnosis Date Noted  . Hypothyroidism 11/14/2015  . Protein-calorie malnutrition, severe 11/05/2015  . Femur fracture ([place]) 11/04/2015  . Hip fracture, left ([place]) 11/04/2015  . Chronic respiratory failure with hypoxia ([place]) 09/13/2015  . Physical deconditioning 07/19/2015  . Acute respiratory failure with hypoxia ([place]) 07/18/2015  . HCAP (healthcare-associated pneumonia) 07/16/2015  . Obstructive bronchiectasis ([place]) 04/29/2015  . H/O malignant neoplasm of skin 12/24/2014  . Cancer ([place]) 04/16/2014  . Mammary Pagets disease ([place]) 04/16/2014  . Cancer of  nipple and areola of female breast ([place]) 04/16/2014  . Bronchiectasis without acute exacerbation ([place]) 03/24/2013  . Pernicious anemia   . Dementia   . Vitamin D deficiency   . Osteoporosis   . Parkinson's disease ([place])   . Hernia of anterior abdominal wall 03/02/2013  . H/O nutritional disorder 01/15/2013  . Personal history of nutritional deficiency 01/15/2013    CBC    Component Value Date/Time   WBC 7.1 11/07/2015 0324   RBC 3.29* 11/07/2015 0324   HGB 9.5* 11/07/2015 0324   HCT 30.9* 11/07/2015 0324   PLT 239 11/07/2015 0324   MCV 93.9 11/07/2015 0324   LYMPHSABS 1.2 11/04/2015 0758   MONOABS 0.6 11/04/2015 0758   EOSABS 0.1 11/04/2015 0758   BASOSABS 0.0 11/04/2015 0758    CMP     Component Value Date/Time   NA 137 11/06/2015 0642   K 3.8 11/06/2015 0642   CL 104 11/06/2015 0642   CO2 22 11/06/2015 0642   GLUCOSE 74 11/06/2015 0642   BUN 15 11/06/2015 0642   CREATININE 0.98 11/06/2015 0642   CALCIUM 7.4* 11/06/2015 0642   PROT 7.0 04/09/2015 0953   ALBUMIN 2.7* 07/20/2015 0440   AST 16 04/09/2015 0953   ALT 5* 04/09/2015 0953   ALKPHOS 50 04/09/2015 0953   BILITOT 0.3 04/09/2015 0953   GFRNONAA 50* 11/06/2015 0642   GFRAA 58* 11/06/2015 0642    No results found for: HGBA1C   Dg Chest 2 View  11/04/2015  CLINICAL DATA:  Preop.  Hip fracture. EXAM: CHEST  2 VIEW COMPARISON:  08/13/2015 FINDINGS: Coarse parenchymal changes in the left lower lung zone are stable. Mild patchy density has developed at the right lung base likely related to volume loss. Lungs remain hyperaerated. No pneumothorax. Chronic appearing deformity of the right humeral head is noted. IMPRESSION: Chronic changes in the left lung. Atelectasis at the right base. Electronically Signed   By: Marybelle Killings M.D.   On: 11/04/2015 08:07   Ct Head Wo Contrast  11/04/2015  CLINICAL DATA:  Fall this morning, Per Dr. Keturah Shavers notes: History of Parkinson's disease, dementia, COPD, She presents from [facility]  screen this morning after being found on the floor at around 6 AM. According to staff they had seen her in bed at 5 a.m. EXAM: CT HEAD WITHOUT CONTRAST CT CERVICAL SPINE WITHOUT CONTRAST TECHNIQUE: Multidetector CT imaging of the head and cervical spine was performed following the standard protocol without intravenous contrast. Multiplanar CT image reconstructions of the cervical spine were also generated. COMPARISON:  Head CT 04/09/2015 FINDINGS: CT HEAD FINDINGS No intracranial hemorrhage. No parenchymal contusion. No midline shift or mass effect. Basilar cisterns are patent. No skull base fracture. No fluid in the paranasal sinuses or mastoid air cells. Orbits are normal. There are periventricular and subcortical white matter hypodensities. Generalized cortical atrophy. CT CERVICAL SPINE FINDINGS No prevertebral soft tissue swelling. Normal alignment of cervical vertebral bodies. No loss of vertebral body height. Normal facet articulation. Normal craniocervical junction. Mild joint space narrowing in the lower cervical spine with endplate sclerosis No evidence epidural or paraspinal hematoma. There is fine airspace opacities in the RIGHT upper lobe. IMPRESSION: 1. No intracranial trauma. 2. No cervical spine fracture. 3. Fine airspace disease in the RIGHT upper lobe with differential including mild edema versus less likely infection. Electronically Signed   By: Suzy Bouchard M.D.   On: 11/04/2015 08:12   Ct Cervical Spine Wo Contrast  11/04/2015  CLINICAL DATA:  Fall this morning, Per Dr. Keturah Shavers notes: History of Parkinson's disease, dementia, COPD, She presents from [facility] screen this morning after being found on the floor at around 6 AM. According to staff they had seen her in bed at 5 a.m. EXAM: CT HEAD WITHOUT CONTRAST CT CERVICAL SPINE WITHOUT CONTRAST TECHNIQUE: Multidetector CT imaging of the head and cervical spine was performed following the standard protocol without intravenous contrast.  Multiplanar CT image reconstructions of the cervical spine were also generated. COMPARISON:  Head CT 04/09/2015 FINDINGS: CT HEAD FINDINGS No intracranial hemorrhage. No parenchymal contusion. No midline shift or mass effect. Basilar cisterns are patent. No skull base fracture. No fluid in the paranasal sinuses or mastoid air cells. Orbits are normal. There are periventricular and subcortical white matter hypodensities. Generalized cortical atrophy. CT CERVICAL SPINE FINDINGS No prevertebral soft tissue swelling. Normal alignment of cervical vertebral bodies. No loss of vertebral body height. Normal facet articulation. Normal craniocervical junction. Mild joint space narrowing in the lower cervical spine with endplate sclerosis No evidence epidural or paraspinal hematoma. There is fine airspace opacities in the RIGHT upper lobe. IMPRESSION: 1. No intracranial trauma. 2. No cervical spine fracture. 3. Fine airspace disease in the RIGHT upper lobe with differential including mild edema versus less likely infection. Electronically Signed   By: Suzy Bouchard M.D.   On: 11/04/2015 08:12   Dg Chest Port 1 View  11/05/2015  CLINICAL DATA:  Wheezing and chest congestion. EXAM: PORTABLE CHEST 1 VIEW COMPARISON:  11/04/2015 and 08/13/2015 FINDINGS: Worsening infiltrates are seen in both lower lung zones, suspicious for pneumonia. Low lung volumes are again noted. No evidence of pleural effusion. Heart size is within normal limits. No pneumothorax visualized. IMPRESSION: Worsening bilateral lower lobe infiltrates, suspicious for pneumonia. Electronically Signed   By: Earle Gell M.D.   On: 11/05/2015 19:23   Dg Hip Operative Unilat With Pelvis Left  11/04/2015  CLINICAL DATA:  Left hip pinning, subcapital femoral neck fracture EXAM: OPERATIVE left HIP (WITH PELVIS IF PERFORMED) 1 VIEWS TECHNIQUE: Fluoroscopic spot  image(s) were submitted for interpretation post-operatively. COMPARISON:  Film from earlier in the same  day FINDINGS: Three fixation screws are noted traversing the left femoral neck into the femoral head. The overall appearance is stable from the prior exam. IMPRESSION: Status post left hip pinning. Please see the operative report for further details regarding fluoroscopy time Electronically Signed   By: Inez Catalina M.D.   On: 11/04/2015 17:13   Dg Hip Unilat With Pelvis 2-3 Views Left  11/04/2015  CLINICAL DATA:  Fall at nursing home today with left hip pain EXAM: DG HIP (WITH OR WITHOUT PELVIS) 2-3V LEFT COMPARISON:  None. FINDINGS: Mildly to moderately impacted fracture left femoral neck. Pelvic bones intact. IMPRESSION: Acute left femur fracture Electronically Signed   By: Skipper Cliche M.D.   On: 11/04/2015 07:22    Not all labs, radiology exams or other studies done during hospitalization come through on my EPIC note; however they are reviewed by me.    Assessment and Plan  Femur fracture ([place]) - Underwent left hip pinning - Okay for discharge to SNF per ortho - She will continue aspirin 325 mg BID for 1 month postoperatively SNF - admited for OT/PT  Dementia At baseline mental status SNF - cont exelon and neupro  HCAP (healthcare-associated pneumonia) She was on vanco and zosyn in hospital  SNF - She will continue Augmentin on discharge for 10 days - Dysphagia 2 per SLP evaluation   Osteoporosis SNF - cont  on denosumab, calcium and vitamin D.  Parkinson's disease ([place]) SNF - not stated as uncontrolled; cont Rytary and rivastigmine    Hypothyroidism SNF - not stated as uncontrolled ; cont synthroid 25 mcg daily   Time spent > 45 min;> 50% of time with patient was spent reviewing records, labs, tests and studies, counseling and developing plan of care  Inocencio Homes  MD

## 2015-11-10 ENCOUNTER — Non-Acute Institutional Stay (SKILLED_NURSING_FACILITY): Payer: Medicare Other | Admitting: Internal Medicine

## 2015-11-10 DIAGNOSIS — J189 Pneumonia, unspecified organism: Secondary | ICD-10-CM | POA: Diagnosis not present

## 2015-11-10 DIAGNOSIS — F0390 Unspecified dementia without behavioral disturbance: Secondary | ICD-10-CM

## 2015-11-10 DIAGNOSIS — G20 Parkinson's disease: Secondary | ICD-10-CM

## 2015-11-10 DIAGNOSIS — S72145A Nondisplaced intertrochanteric fracture of left femur, initial encounter for closed fracture: Secondary | ICD-10-CM | POA: Diagnosis not present

## 2015-11-10 DIAGNOSIS — E038 Other specified hypothyroidism: Secondary | ICD-10-CM

## 2015-11-10 DIAGNOSIS — E034 Atrophy of thyroid (acquired): Secondary | ICD-10-CM

## 2015-11-10 DIAGNOSIS — M810 Age-related osteoporosis without current pathological fracture: Secondary | ICD-10-CM | POA: Diagnosis not present

## 2015-11-10 LAB — TYPE AND SCREEN
ABO/RH(D): A NEG
ANTIBODY SCREEN: NEGATIVE
Unit division: 0
Unit division: 0

## 2015-11-14 ENCOUNTER — Encounter: Payer: Self-pay | Admitting: Internal Medicine

## 2015-11-14 DIAGNOSIS — E039 Hypothyroidism, unspecified: Secondary | ICD-10-CM | POA: Insufficient documentation

## 2015-11-14 NOTE — Assessment & Plan Note (Signed)
She was on vanco and zosyn in hospital  SNF - She will continue Augmentin on discharge for 10 days - Dysphagia 2 per SLP evaluation

## 2015-11-14 NOTE — Assessment & Plan Note (Signed)
At baseline mental status SNF - cont exelon and neupro

## 2015-11-14 NOTE — Assessment & Plan Note (Signed)
SNF - cont  on denosumab, calcium and vitamin D.

## 2015-11-14 NOTE — Assessment & Plan Note (Signed)
SNF - not stated as uncontrolled; cont Rytary and rivastigmine

## 2015-11-14 NOTE — Assessment & Plan Note (Signed)
SNF - not stated as uncontrolled ; cont synthroid 25 mcg daily

## 2015-11-14 NOTE — Assessment & Plan Note (Signed)
-   Underwent left hip pinning - Okay for discharge to SNF per ortho - She will continue aspirin 325 mg BID for 1 month postoperatively SNF - admited for OT/PT

## 2015-11-18 ENCOUNTER — Encounter (HOSPITAL_COMMUNITY): Payer: Self-pay | Admitting: Orthopedic Surgery

## 2015-11-22 ENCOUNTER — Non-Acute Institutional Stay (SKILLED_NURSING_FACILITY): Payer: Medicare Other | Admitting: Internal Medicine

## 2015-11-22 ENCOUNTER — Encounter: Payer: Self-pay | Admitting: Internal Medicine

## 2015-11-22 DIAGNOSIS — Z8701 Personal history of pneumonia (recurrent): Secondary | ICD-10-CM | POA: Diagnosis not present

## 2015-11-22 DIAGNOSIS — R58 Hemorrhage, not elsewhere classified: Secondary | ICD-10-CM

## 2015-11-22 NOTE — Progress Notes (Signed)
Location:  [place] Room Number: [place] of Service:  SNF [phone]) Provider:  Oscar La., Hasina Kreager  PA-C  Renata Caprice, DO  Patient Care Team: Renata Caprice, DO as PCP - General (Family Medicine) Elsie Stain, MD as Consulting Physician (Pulmonary Disease)  Extended Emergency Contact Information Primary Emergency Contact: [place], [place] [address] [address] [place] of [place] Phone: [phone] Relation: Daughter Secondary Emergency Contact: Baughman,Paul R Address: [address]          [place] of [place] Phone: [phone] Relation: Spouse  Code Status:  DNR Goals of care: Advanced Directive information Advanced Directives 11/22/2015  Does patient have an advance directive? Yes  Type of Advance Directive Out of facility DNR (pink MOST or yellow form)  Does patient want to make changes to advanced directive? No - Patient declined  Pre-existing out of facility DNR order (yellow form or pink MOST form) Pink MOST form placed in chart (order not valid for inpatient use)    Chief complaint acute visit secondary to increased ecchymosis lower legs bilaterally   HPI:  Pt is a 82 y.o. female seen today for an acute visit for increased ecchymosis on her lower legs.  Patient does have a history of recent left hip repair she is on aspirin 325 mg twice a day for prophylaxis   She denies any trauma or fall or nursing staff does not report any.  Apparently this has increased somewhat over the weekend.  There is no bleeding drainage or sign of infection here.  Per chart review I do not see any history of thrombocytopenia     Past Medical History  Diagnosis Date  . Colon cancer ([place])   . Parkinson's disease ([place])   . Osteoporosis   . Vitamin D deficiency   . Dementia   . Ventral hernia   . Pernicious anemia   . COPD (chronic obstructive pulmonary disease) ([place])   . Pagets disease, breast  ([place])   . Renal disorder     Renal insufficiency  . Paget's bone disease    Past Surgical History  Procedure Laterality Date  . Colon surgery      bowel resection  . Breast surgery Left   . Hip pinning,cannulated Left 11/04/2015    Procedure: CANNULATED HIP PINNING;  Surgeon: Tania Ade, MD;  Location: [facility];  Service: Orthopedics;  Laterality: Left;  . Hip arthroplasty Left 11/04/2015    Procedure: ARTHROPLASTY BIPOLAR HIP (HEMIARTHROPLASTY);  Surgeon: Tania Ade, MD;  Location: [facility];  Service: Orthopedics;  Laterality: Left;    Allergies  Allergen Reactions  . Zithromax [Azithromycin] Diarrhea  . Iodine Other (See Comments)    Unknown, might be rash/hives  . Ivp Dye [Iodinated Diagnostic Agents] Other (See Comments)    Unknown rash/hives maybe  . Shellfish Allergy Other (See Comments)    Unknown rash/hives, scallops       Medication List       This list is accurate as of: 11/22/15 12:26 PM.  Always use your most recent med list.               acidophilus Caps capsule  Take 1 capsule by mouth daily.     ACIDOPHILUS PLUS PECTIN Tabs  Take 1 tablet by mouth daily.     albuterol (5 MG/ML) 0.5% nebulizer solution  Commonly known as:  PROVENTIL  Take 0.5 mLs (2.5 mg total) by  nebulization 2 (two) times daily.     aspirin 325 MG EC tablet  Take 1 tablet (325 mg total) by mouth 2 (two) times daily.     AZILECT 1 MG Tabs tablet  Generic drug:  rasagiline  Take 1 mg by mouth daily. 1400.     BENEFIBER PO  Take 1 scoop by mouth daily.     budesonide 0.25 MG/2ML nebulizer solution  Commonly known as:  PULMICORT  Take 0.25 mg by nebulization daily.     CALTRATE 600 PLUS-VIT D PO  Take 1 tablet by mouth 2 (two) times daily.     cyanocobalamin 1000 MCG/ML injection  Commonly known as:  (VITAMIN B-12)  Inject 1,000 mcg into the muscle every 30 (thirty) days.     denosumab 60 MG/ML Soln injection  Commonly known as:  PROLIA  Inject 60 mg into the skin  every 6 (six) months. Reported on 11/22/2015     DIABETIC TUSSIN EX PO  Take 5 mLs by mouth every 6 (six) hours as needed (congestion/cough.).     ENSURE PLUS Liqd  Take 237 mLs by mouth 2 (two) times daily between meals.     EXELON 13.3 MG/24HR Pt24  Generic drug:  Rivastigmine  Place 1 patch onto the skin daily.     famotidine 40 MG/5ML suspension  Commonly known as:  PEPCID  Take 2.5 mLs (20 mg total) by mouth 2 (two) times daily.     fluticasone 50 MCG/ACT nasal spray  Commonly known as:  FLONASE  Place 1 spray into both nostrils daily.     FLUTTER Devi  Use at least 4 times a day     HYDROcodone-acetaminophen 5-325 MG tablet  Commonly known as:  NORCO/VICODIN  Take 1-2 tablets by mouth every 6 (six) hours as needed for moderate pain.     levothyroxine 25 MCG tablet  Commonly known as:  SYNTHROID, LEVOTHROID  Take 1 tablet (25 mcg total) by mouth daily before breakfast.     loperamide 2 MG capsule  Commonly known as:  IMODIUM  Take 2 mg by mouth daily as needed for diarrhea or loose stools.     NEUPRO 4 MG/24HR  Generic drug:  rotigotine  Place 1 patch onto the skin daily.     ranitidine 75 MG tablet  Commonly known as:  ZANTAC  Take 75 mg by mouth 2 (two) times daily.     RESOURCE THICKENUP CLEAR Powd  Take 120 g by mouth as needed.     RYTARY 48.75-195 MG Cpcr  Generic drug:  Carbidopa-Levodopa ER  Take 1 tablet by mouth 3 (three) times daily.     senna-docusate 8.6-50 MG tablet  Commonly known as:  Senokot-S  Take 1 tablet by mouth at bedtime as needed for mild constipation.        Review of Systems   Limited secondary to dementia obtain from nursing in patient.  In general no complaints of fever or chills.  Skin again increased ecchymosis as noted above and lower legs.  Head ears eyes nose mouth and throat no plates visual changes or sore throat nasal discharge.  Respiratory has completed antibiotics for a pneumonia this appears to be stable  with no complaints of cough or shortness of breath or congestion.  Cardiac no chest pain minimal lower extremity edema on the left surgical site.  GI no complaints of nausea vomiting diarrhea constipation or abdominal issues.  Muscle skeletal joint pain appears to be controlled currently on hydrocodone.  Neurologic is not complaining of dizziness headache or numbness.  Psych does have a history of dementia I do not note any inhalers   Immunization History  Administered Date(s) Administered  . Influenza,inj,Quad PF,36+ Mos 08/13/2015   Pertinent  Health Maintenance Due  Topic Date Due  . PNA vac Low Risk Adult (1 of 2 - PCV13) 02/24/1991  . INFLUENZA VACCINE  02/15/2016  . DEXA SCAN  Completed   No flowsheet data found. Functional Status Survey:    Filed Vitals:   11/22/15 1135  BP: 112/62  Pulse: 77  Temp: 97 F (36.1 C)  TempSrc: Oral  Resp: 24  Height: 4\' 9"  (1.448 m)  Weight: 81 lb (36.741 kg)   Body mass index is 17.52 kg/(m^2). Physical Exam   In general this is a pleasant frail elderly female in no distress.  Her skin is warm and dry she does have numerous erythematous ecchymoses noted on her lower legs bilaterally I do not see any bleeding drainage or sign of cellulitis apparently this has increased somewhat.  She also appears to have quite fragile skin of her extremities bilaterally with some bruising.  Eyes pupils appear equal round reactive to light sclera and conjunctiva are clear visual acuity appears grossly intact.  Oropharynx is clear mucous membranes moist.  Chest is clear to auscultation there is no labored breathing.  Heart is regular rate and rhythm without murmur gallop or rub she has trace left lower extremity edema pedal pulses are intact there is no tenderness to palpation of her legs.  Abdomen is soft nontender positive bowel sounds she does have a large umbilical hernia which is nontender.  Musculoskeletal is status post left hip  repair is able to move her other extremities at baseline with lower extremity weakness and some contractures of her lower extremities with weakness.  Neurologic is grossly intact her speech is clear no lateralizing findings.  Psych she is oriented to self only is pleasant and cooperative.    Labs reviewed:  Recent Labs  07/18/15 0528 07/19/15 0456 07/20/15 0440 11/04/15 0758 11/04/15 1404 11/05/15 0455 11/06/15 0642  NA 137 138 140 139  --  135 137  K 3.7 3.5 3.3* 3.8  --  4.2 3.8  CL 104 104 105 104  --  98* 104  CO2 27 26 27 28   --  28 22  GLUCOSE 78 77 90 99  --  100* 74  BUN 20 16 11 20   --  14 15  CREATININE 0.96 1.05* 0.90 0.89 0.92 0.95 0.98  CALCIUM 8.3* 8.3* 8.1* 9.3  --  8.1* 7.4*  MG 1.7 1.8 1.6*  --   --   --   --   PHOS 2.9 2.7 2.4*  --   --   --   --     Recent Labs  04/09/15 0953  07/18/15 0528 07/19/15 0456 07/20/15 0440  AST 16  --   --   --   --   ALT 5*  --   --   --   --   ALKPHOS 50  --   --   --   --   BILITOT 0.3  --   --   --   --   PROT 7.0  --   --   --   --   ALBUMIN 3.3*  < > 2.8* 2.7* 2.7*  < > = values in this interval not displayed.  Recent Labs  07/19/15 0456 07/20/15 0440 11/04/15 [id number]  11/05/15 0455 11/06/15 0642 11/07/15 0324  WBC 2.6* 2.4* 7.1  < > 7.2 11.1* 7.1  NEUTROABS 1.3* 1.4* 5.3  --   --   --   --   HGB 10.2* 10.9* 12.6  < > 10.5* 10.1* 9.5*  HCT 32.1* 34.3* 37.6  < > 33.8* 32.8* 30.9*  MCV 96.4 98.8 90.2  < > 93.1 94.3 93.9  PLT 172 166 309  < > 246 221 239  < > = values in this interval not displayed. No results found for: TSH No results found for: HGBA1C No results found for: CHOL, HDL, LDLCALC, LDLDIRECT, TRIG, CHOLHDL  Significant Diagnostic Results in last 30 days:  Dg Chest 1 View  11/08/2015  CLINICAL DATA:  SOB, congestion for several days. History of pneumonia. EXAM: CHEST  1 VIEW COMPARISON:  11/05/2015 FINDINGS: Progressive coarse airspace consolidation at the left lung base. Little change in the  milder coarse interstitial and airspace opacities at the right lung base. Heart size difficult to assess due to adjacent opacities. Low lung volumes. Blunting of the left lateral costophrenic angle suggesting small effusion. Coarse perihilar interstitial markings right worse than left. Atheromatous aorta. No pneumothorax. Residual oral contrast material in the nondistended stomach. Mild spondylitic changes in the thoracolumbar spine. IMPRESSION: 1. Asymmetric airspace disease, progressive in the left lower lung since previous exam Electronically Signed   By: Lucrezia Europe M.D.   On: 11/08/2015 13:04   Dg Chest 2 View  11/04/2015  CLINICAL DATA:  Preop.  Hip fracture. EXAM: CHEST  2 VIEW COMPARISON:  08/13/2015 FINDINGS: Coarse parenchymal changes in the left lower lung zone are stable. Mild patchy density has developed at the right lung base likely related to volume loss. Lungs remain hyperaerated. No pneumothorax. Chronic appearing deformity of the right humeral head is noted. IMPRESSION: Chronic changes in the left lung. Atelectasis at the right base. Electronically Signed   By: Marybelle Killings M.D.   On: 11/04/2015 08:07   Ct Head Wo Contrast  11/04/2015  CLINICAL DATA:  Fall this morning, Per Dr. Keturah Shavers notes: History of Parkinson's disease, dementia, COPD, She presents from [facility] screen this morning after being found on the floor at around 6 AM. According to staff they had seen her in bed at 5 a.m. EXAM: CT HEAD WITHOUT CONTRAST CT CERVICAL SPINE WITHOUT CONTRAST TECHNIQUE: Multidetector CT imaging of the head and cervical spine was performed following the standard protocol without intravenous contrast. Multiplanar CT image reconstructions of the cervical spine were also generated. COMPARISON:  Head CT 04/09/2015 FINDINGS: CT HEAD FINDINGS No intracranial hemorrhage. No parenchymal contusion. No midline shift or mass effect. Basilar cisterns are patent. No skull base fracture. No fluid in the paranasal  sinuses or mastoid air cells. Orbits are normal. There are periventricular and subcortical white matter hypodensities. Generalized cortical atrophy. CT CERVICAL SPINE FINDINGS No prevertebral soft tissue swelling. Normal alignment of cervical vertebral bodies. No loss of vertebral body height. Normal facet articulation. Normal craniocervical junction. Mild joint space narrowing in the lower cervical spine with endplate sclerosis No evidence epidural or paraspinal hematoma. There is fine airspace opacities in the RIGHT upper lobe. IMPRESSION: 1. No intracranial trauma. 2. No cervical spine fracture. 3. Fine airspace disease in the RIGHT upper lobe with differential including mild edema versus less likely infection. Electronically Signed   By: Suzy Bouchard M.D.   On: 11/04/2015 08:12   Ct Cervical Spine Wo Contrast  11/04/2015  CLINICAL DATA:  Fall this morning, Per  Dr. Keturah Shavers notes: History of Parkinson's disease, dementia, COPD, She presents from [facility] screen this morning after being found on the floor at around 6 AM. According to staff they had seen her in bed at 5 a.m. EXAM: CT HEAD WITHOUT CONTRAST CT CERVICAL SPINE WITHOUT CONTRAST TECHNIQUE: Multidetector CT imaging of the head and cervical spine was performed following the standard protocol without intravenous contrast. Multiplanar CT image reconstructions of the cervical spine were also generated. COMPARISON:  Head CT 04/09/2015 FINDINGS: CT HEAD FINDINGS No intracranial hemorrhage. No parenchymal contusion. No midline shift or mass effect. Basilar cisterns are patent. No skull base fracture. No fluid in the paranasal sinuses or mastoid air cells. Orbits are normal. There are periventricular and subcortical white matter hypodensities. Generalized cortical atrophy. CT CERVICAL SPINE FINDINGS No prevertebral soft tissue swelling. Normal alignment of cervical vertebral bodies. No loss of vertebral body height. Normal facet articulation. Normal  craniocervical junction. Mild joint space narrowing in the lower cervical spine with endplate sclerosis No evidence epidural or paraspinal hematoma. There is fine airspace opacities in the RIGHT upper lobe. IMPRESSION: 1. No intracranial trauma. 2. No cervical spine fracture. 3. Fine airspace disease in the RIGHT upper lobe with differential including mild edema versus less likely infection. Electronically Signed   By: Suzy Bouchard M.D.   On: 11/04/2015 08:12   Dg Chest Port 1 View  11/05/2015  CLINICAL DATA:  Wheezing and chest congestion. EXAM: PORTABLE CHEST 1 VIEW COMPARISON:  11/04/2015 and 08/13/2015 FINDINGS: Worsening infiltrates are seen in both lower lung zones, suspicious for pneumonia. Low lung volumes are again noted. No evidence of pleural effusion. Heart size is within normal limits. No pneumothorax visualized. IMPRESSION: Worsening bilateral lower lobe infiltrates, suspicious for pneumonia. Electronically Signed   By: Earle Gell M.D.   On: 11/05/2015 19:23   Dg Swallowing Func-speech Pathology  11/08/2015  Objective Swallowing Evaluation: Type of Study: MBS-Modified Barium Swallow Study Patient Details Name: AEVAH SODERMAN MRN: [id number] Date of Birth: 08/10/1923 Today's Date: 11/08/2015 Time: SLP Start Time (ACUTE ONLY): 1050-SLP Stop Time (ACUTE ONLY): 1120 SLP Time Calculation (min) (ACUTE ONLY): 30 min Past Medical History: Past Medical History Diagnosis Date . Colon cancer ([place])  . Parkinson's disease ([place])  . Osteoporosis  . Vitamin D deficiency  . Dementia  . Ventral hernia  . Pernicious anemia  . COPD (chronic obstructive pulmonary disease) ([place])  . Pagets disease, breast ([place])  . Renal disorder    Renal insufficiency . Paget's bone disease  Past Surgical History: Past Surgical History Procedure Laterality Date . Colon surgery     bowel resection . Breast surgery Left  HPI: Sherryl Ambrosi is a 82 yo F with medical history significant for Parkinson's Disease, Alzheimer's Dementia,  bronchiectasis, colon cancer and now left hip fx due to unwitnessed fall at nursing home. Pt underwent cannulated hip screw fixation of left hip on 4/20. Most current CXR showingWorsening bilateral lower lobe infiltrates, suspicious for Subjective: The patient was seent sitting partially reclined in bed with her daughter present who is a Software engineer.  Assessment / Plan / Recommendation CHL IP CLINICAL IMPRESSIONS 11/08/2015 Therapy Diagnosis Mild oral phase dysphagia;Mild pharyngeal phase dysphagia Clinical Impression Pt demonstrates mild oral deficits likely associated with Parkinson's, exisitng at baseline per family including prolonged mastication and brief oral holding. More significant are mild oral residuals that fall to the pharynx post swallow and pose a risk fo aspiraiton after the swallow. A cued second swallow clear this and otherwise oropahryngeal strength  is WNL. Pt did have one instance of sensed aspiration before the swallow on the initial sip, due to impaired timing. Futher trials and consecutive swallows with a straw did not result in any further aspriation events. Given pts decondiitoning and baseline Parkinsons, pt is at increased risk of aspiration.  Recommend strict aspiration precautions with Dys 2/thin liquid diet including up to chair for meals, full supervision to swallow twice and crushed meds due to appearance of difficulty transiting barium tablet through the GE junction. Discussed precautions with pts husband. Will plan to f/u for tolerance.  Impact on safety and function --   CHL IP TREATMENT RECOMMENDATION 11/08/2015 Treatment Recommendations Therapy as outlined in treatment plan below   Prognosis 11/08/2015 Prognosis for Safe Diet Advancement Good Barriers to Reach Goals Cognitive deficits Barriers/Prognosis Comment -- CHL IP DIET RECOMMENDATION 11/08/2015 SLP Diet Recommendations Dysphagia 2 (Fine chop) solids;Thin liquid Liquid Administration via Cup;Straw Medication Administration  Crushed with puree Compensations Multiple dry swallows after each bite/sip Postural Changes Seated upright at 90 degrees   CHL IP OTHER RECOMMENDATIONS 11/08/2015 Recommended Consults -- Oral Care Recommendations Oral care BID Other Recommendations --   CHL IP FOLLOW UP RECOMMENDATIONS 11/08/2015 Follow up Recommendations Skilled Nursing facility   [facility] IP FREQUENCY AND DURATION 11/08/2015 Speech Therapy Frequency (ACUTE ONLY) min 2x/week Treatment Duration 2 weeks      CHL IP ORAL PHASE 11/08/2015 Oral Phase Impaired Oral - Pudding Teaspoon -- Oral - Pudding Cup -- Oral - Honey Teaspoon -- Oral - Honey Cup -- Oral - Nectar Teaspoon -- Oral - Nectar Cup -- Oral - Nectar Straw -- Oral - Thin Teaspoon -- Oral - Thin Cup Decreased bolus cohesion;Lingual/palatal residue;Delayed oral transit Oral - Thin Straw Lingual/palatal residue;Decreased bolus cohesion;Delayed oral transit Oral - Puree Lingual/palatal residue;Decreased bolus cohesion Oral - Mech Soft Lingual/palatal residue;Decreased bolus cohesion;Delayed oral transit Oral - Regular -- Oral - Multi-Consistency -- Oral - Pill -- Oral Phase - Comment --  CHL IP PHARYNGEAL PHASE 11/08/2015 Pharyngeal Phase Impaired Pharyngeal- Pudding Teaspoon -- Pharyngeal -- Pharyngeal- Pudding Cup -- Pharyngeal -- Pharyngeal- Honey Teaspoon -- Pharyngeal -- Pharyngeal- Honey Cup -- Pharyngeal -- Pharyngeal- Nectar Teaspoon -- Pharyngeal -- Pharyngeal- Nectar Cup -- Pharyngeal -- Pharyngeal- Nectar Straw -- Pharyngeal -- Pharyngeal- Thin Teaspoon -- Pharyngeal -- Pharyngeal- Thin Cup Delayed swallow initiation-pyriform sinuses;Penetration/Aspiration before swallow;WFL Pharyngeal Material enters airway, passes BELOW cords and not ejected out despite cough attempt by patient Pharyngeal- Thin Straw Pharyngeal residue - valleculae;Pharyngeal residue - pyriform Pharyngeal -- Pharyngeal- Puree Pharyngeal residue - valleculae;Pharyngeal residue - pyriform Pharyngeal -- Pharyngeal- Mechanical  Soft -- Pharyngeal -- Pharyngeal- Regular WFL Pharyngeal -- Pharyngeal- Multi-consistency -- Pharyngeal -- Pharyngeal- Pill WFL Pharyngeal -- Pharyngeal Comment --  No flowsheet data found. No flowsheet data found. Herbie Baltimore, MA CCC-SLP [phone] Othelia Pulling Katherene Ponto 11/08/2015, 12:31 PM              Dg Hip Operative Unilat With Pelvis Left  11/04/2015  CLINICAL DATA:  Left hip pinning, subcapital femoral neck fracture EXAM: OPERATIVE left HIP (WITH PELVIS IF PERFORMED) 1 VIEWS TECHNIQUE: Fluoroscopic spot image(s) were submitted for interpretation post-operatively. COMPARISON:  Film from earlier in the same day FINDINGS: Three fixation screws are noted traversing the left femoral neck into the femoral head. The overall appearance is stable from the prior exam. IMPRESSION: Status post left hip pinning. Please see the operative report for further details regarding fluoroscopy time Electronically Signed   By: Inez Catalina M.D.   On:  11/04/2015 17:13   Dg Hip Unilat With Pelvis 2-3 Views Left  11/04/2015  CLINICAL DATA:  Fall at nursing home today with left hip pain EXAM: DG HIP (WITH OR WITHOUT PELVIS) 2-3V LEFT COMPARISON:  None. FINDINGS: Mildly to moderately impacted fracture left femoral neck. Pelvic bones intact. IMPRESSION: Acute left femur fracture Electronically Signed   By: Skipper Cliche M.D.   On: 11/04/2015 07:22    Assessment/Plan   #1 ecchymosis apparently per nursing this has increased although clinically patient appears stable I do not see a history of thrombocytopenia platelet level was within normal range during hospitalization per chart review will update a CBC tomorrow for follow-up here.  Also will update a basic metabolic panel.  I do note her hemoglobin has dropped slightly from hospital 9.5 had been in the low tens will wait update lab results I do not see any active bleeding.  I did discuss anticoagulation with aspirin 325 twice a day-at this point would be hesitant  to reduce this secondary to DVT risk with recent surgery at this point per discussion with Dr. Sheppard Coil feel risk of reducing aspirin outweighs any benefit of possibly reducing the ecchymosis  #2 history pneumonia this appears resolved clinically she appears to be doing well respiratory wise at this point will monitor she has completed her antibiotics.  [id number] note greater than 25 minutes spent assessing patient-discussing her status with nursing staff-as well as with Dr. Sheppard Coil via phone-and coordinating and formulating a plan of care-of note greater than 50% of time spent coordinating plan of care with nursing input as well as discussion with Dr. Sheppard Coil

## 2015-11-23 LAB — BASIC METABOLIC PANEL
BUN: 20 mg/dL (ref 4–21)
Creatinine: 0.8 mg/dL (ref 0.5–1.1)
GLUCOSE: 138 mg/dL
POTASSIUM: 4.8 mmol/L (ref 3.4–5.3)
Sodium: 136 mmol/L — AB (ref 137–147)

## 2015-11-23 LAB — CBC AND DIFFERENTIAL
HCT: 31 % — AB (ref 36–46)
HEMOGLOBIN: 9.7 g/dL — AB (ref 12.0–16.0)
PLATELETS: 436 10*3/uL — AB (ref 150–399)
WBC: 7.5 10*3/mL

## 2015-11-27 DIAGNOSIS — R58 Hemorrhage, not elsewhere classified: Secondary | ICD-10-CM | POA: Insufficient documentation

## 2015-12-03 ENCOUNTER — Encounter: Payer: Self-pay | Admitting: Internal Medicine

## 2015-12-03 ENCOUNTER — Non-Acute Institutional Stay (SKILLED_NURSING_FACILITY): Payer: Medicare Other | Admitting: Internal Medicine

## 2015-12-03 DIAGNOSIS — E034 Atrophy of thyroid (acquired): Secondary | ICD-10-CM

## 2015-12-03 DIAGNOSIS — E43 Unspecified severe protein-calorie malnutrition: Secondary | ICD-10-CM

## 2015-12-03 DIAGNOSIS — M810 Age-related osteoporosis without current pathological fracture: Secondary | ICD-10-CM

## 2015-12-03 DIAGNOSIS — J449 Chronic obstructive pulmonary disease, unspecified: Secondary | ICD-10-CM | POA: Insufficient documentation

## 2015-12-03 DIAGNOSIS — F0390 Unspecified dementia without behavioral disturbance: Secondary | ICD-10-CM | POA: Diagnosis not present

## 2015-12-03 DIAGNOSIS — G20 Parkinson's disease: Secondary | ICD-10-CM | POA: Diagnosis not present

## 2015-12-03 DIAGNOSIS — J9611 Chronic respiratory failure with hypoxia: Secondary | ICD-10-CM

## 2015-12-03 DIAGNOSIS — S72145D Nondisplaced intertrochanteric fracture of left femur, subsequent encounter for closed fracture with routine healing: Secondary | ICD-10-CM

## 2015-12-03 DIAGNOSIS — J189 Pneumonia, unspecified organism: Secondary | ICD-10-CM

## 2015-12-03 DIAGNOSIS — C50019 Malignant neoplasm of nipple and areola, unspecified female breast: Secondary | ICD-10-CM | POA: Diagnosis not present

## 2015-12-03 DIAGNOSIS — E038 Other specified hypothyroidism: Secondary | ICD-10-CM

## 2015-12-03 DIAGNOSIS — J439 Emphysema, unspecified: Secondary | ICD-10-CM | POA: Diagnosis not present

## 2015-12-03 NOTE — Progress Notes (Signed)
MRN: [id number] Name: Shannon Dunn  Sex: female Age: 83 y.o. DOB: [birth year]  [place] #: [facility] Facility/Room:108 P Level Of Care: SNF Provider: Noah Delaine. Sheppard Coil, MD Emergency Contacts: Extended Emergency Contact Information Primary Emergency Contact: [place], [place] [address] Johnnette Litter of [place] Phone: [phone] Relation: Daughter Secondary Emergency Contact: Pohle,Paul R Address: [address] Johnnette Litter of [place] Phone: [phone] Relation: Spouse  Code Status: DNR  Allergies: Zithromax; Iodine; Ivp dye; and Shellfish allergy  Chief Complaint  Patient presents with  . Discharge Note    Discharged from SNF    HPI: Patient is 82 y.o. 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 Report from nursing home is that she was seen in bed at 5am, but then was found down at [place] fall. She was complaining of hip pain in the ED. Imaging including head CT and neck CT were normal. Hip xray revealed a hip fracture.Pt was admitted to [facility] from 4/20-24 where she underwent surgical repair of hip. Hospital course was complicated by pneumonia most likely aspiration and acute encephalopathy. Pt was admitted to [facility] for OT/PT and is now ready to be d/c to home.  Past Medical History  Diagnosis Date  . Colon cancer ([place])   . Parkinson's disease ([place])   . Osteoporosis   . Vitamin D deficiency   . Dementia   . Ventral hernia   . Pernicious anemia   . COPD (chronic obstructive pulmonary disease) ([place])   . Pagets disease, breast ([place])   . Renal disorder     Renal insufficiency  . Paget's bone disease     Past Surgical History  Procedure Laterality Date  . Colon surgery      bowel resection  . Breast surgery Left   . Hip pinning,cannulated Left 11/04/2015    Procedure: CANNULATED HIP PINNING;  Surgeon: Tania Ade, MD;  Location: [place];  Service: Orthopedics;  Laterality: Left;  . Hip arthroplasty Left 11/04/2015    Procedure: ARTHROPLASTY BIPOLAR HIP (HEMIARTHROPLASTY);  Surgeon: Tania Ade, MD;  Location: [place];  Service: Orthopedics;  Laterality: Left;      Medication List       This list is accurate as of: 12/03/15  4:11 PM.  Always use your most recent med list.               ACIDOPHILUS PLUS PECTIN PO  Take 1 capsule by mouth daily.     albuterol (5 MG/ML) 0.5% nebulizer solution  Commonly known as:  PROVENTIL  Take 0.5 mLs (2.5 mg total) by nebulization 2 (two) times daily.     aspirin 325 MG EC tablet  Take 325 mg by mouth 2 (two) times daily. Stop Date 12/08/15 then continue once a day regimen 81 mg     AZILECT 1 MG Tabs tablet  Generic drug:  rasagiline  Take 1 mg by mouth daily. At 2 pm.     BENEFIBER PO  Take 1 scoop by mouth daily. In 120 ml fluid     budesonide 0.25 MG/2ML nebulizer solution  Commonly known as:  PULMICORT  Take 0.25 mg by nebulization daily.     CALTRATE 600 PLUS-VIT D PO  Take 1 tablet by mouth 2 (two) times daily.     cyanocobalamin 1000 MCG/ML  injection  Commonly known as:  (VITAMIN B-12)  Inject 1,000 mcg into the muscle every 30 (thirty) days. On the 4th of the month.     denosumab 60 MG/ML Soln injection  Commonly known as:  PROLIA  Inject 60 mg into the skin every 6 (six) months. Reported on 11/22/2015     DIABETIC TUSSIN EX PO  Take 5 mLs by mouth every 6 (six) hours as needed (congestion/cough.).     ENSURE PLUS Liqd  Take 237 ml by mouth daily between meals     EXELON 13.3 MG/24HR Pt24  Generic drug:  Rivastigmine  Place 1 patch onto the skin daily. Rotate sites     fluticasone 50 MCG/ACT nasal spray  Commonly known as:  FLONASE  Place 1 spray into both nostrils daily.     HYDROcodone-acetaminophen 5-325 MG tablet  Commonly known as:  NORCO/VICODIN  Take 1 tablet by mouth every 6 (six) hours as needed for moderate  pain.     levothyroxine 25 MCG tablet  Commonly known as:  SYNTHROID, LEVOTHROID  Take 1 tablet (25 mcg total) by mouth daily before breakfast.     loperamide 2 MG capsule  Commonly known as:  IMODIUM  Take 2 mg by mouth daily as needed for diarrhea or loose stools.     NEUPRO 4 MG/24HR  Generic drug:  rotigotine  Place 1 patch onto the skin daily. Remove old patch before applying new one.     ranitidine 75 MG tablet  Commonly known as:  ZANTAC  Take 75 mg by mouth 2 (two) times daily.     RESOURCE THICKENUP CLEAR Powd  Take 120 g by mouth as needed.     RYTARY 48.75-195 MG Cpcr  Generic drug:  Carbidopa-Levodopa ER  Take 1 tablet by mouth 3 (three) times daily.     senna-docusate 8.6-50 MG tablet  Commonly known as:  Senokot-S  Take 1 tablet by mouth at bedtime as needed for mild constipation.     UNABLE TO FIND  Med Name: Magic Cup by mouth 2 daily        Meds ordered this encounter  Medications  . aspirin 325 MG EC tablet    Sig: Take 325 mg by mouth 2 (two) times daily. Stop Date 12/08/15 then continue once a day regimen 81 mg  . UNABLE TO FIND    Sig: Med Name: Magic Cup by mouth 2 daily  . HYDROcodone-acetaminophen (NORCO/VICODIN) 5-325 MG tablet    Sig: Take 1 tablet by mouth every 6 (six) hours as needed for moderate pain.  . Lactobacillus Acid-Pectin (ACIDOPHILUS PLUS PECTIN PO)    Sig: Take 1 capsule by mouth daily.    Immunization History  Administered Date(s) Administered  . Influenza,inj,Quad PF,36+ Mos 08/13/2015  . PPD Test 11/08/2015, 11/22/2015    Social History  Substance Use Topics  . Smoking status: Never Smoker   . Smokeless tobacco: Never Used  . Alcohol Use: Yes     Comment: glass of wine maybe once a week    Filed Vitals:   12/03/15 1112  BP: 142/76  Pulse: 81  Temp: 97.4 F (36.3 C)  Resp: 17    Physical Exam  GENERAL APPEARANCE: Alert, conversant. No acute distress.  HEENT: Unremarkable. RESPIRATORY: Breathing is even,  unlabored. Lung sounds are clear   CARDIOVASCULAR: Heart RRR no murmurs, rubs or gallops. No peripheral edema.  GASTROINTESTINAL: Abdomen is soft, non-tender, not distended w/ normal bowel sounds.  NEUROLOGIC: Cranial nerves 2-12  grossly intact. Moves all extremities  Patient Active Problem List   Diagnosis Date Noted  . Ecchymosis 11/27/2015  . Hypothyroidism 11/14/2015  . Protein-calorie malnutrition, severe 11/05/2015  . Femur fracture ([place]) 11/04/2015  . Hip fracture, left ([place]) 11/04/2015  . Chronic respiratory failure with hypoxia ([place]) 09/13/2015  . Physical deconditioning 07/19/2015  . Acute respiratory failure with hypoxia ([place]) 07/18/2015  . HCAP (healthcare-associated pneumonia) 07/16/2015  . Obstructive bronchiectasis ([place]) 04/29/2015  . H/O malignant neoplasm of skin 12/24/2014  . Cancer ([place]) 04/16/2014  . Mammary Pagets disease ([place]) 04/16/2014  . Cancer of nipple and areola of female breast ([place]) 04/16/2014  . Bronchiectasis without acute exacerbation ([place]) 03/24/2013  . Pernicious anemia   . Dementia   . Vitamin D deficiency   . Osteoporosis   . Parkinson's disease ([place])   . Hernia of anterior abdominal wall 03/02/2013  . H/O nutritional disorder 01/15/2013  . Personal history of nutritional deficiency 01/15/2013    CBC    Component Value Date/Time   WBC 7.5 11/23/2015   WBC 7.1 11/07/2015 0324   RBC 3.29* 11/07/2015 0324   HGB 9.7* 11/23/2015   HCT 31* 11/23/2015   PLT 436* 11/23/2015   MCV 93.9 11/07/2015 0324   LYMPHSABS 1.2 11/04/2015 0758   MONOABS 0.6 11/04/2015 0758   EOSABS 0.1 11/04/2015 0758   BASOSABS 0.0 11/04/2015 0758    CMP     Component Value Date/Time   NA 136* 11/23/2015   NA 137 11/06/2015 0642   K 4.8 11/23/2015   CL 104 11/06/2015 0642   CO2 22 11/06/2015 0642   GLUCOSE 74 11/06/2015 0642   BUN 20 11/23/2015   BUN 15 11/06/2015 0642   CREATININE 0.8 11/23/2015   CREATININE 0.98 11/06/2015 0642   CALCIUM 7.4* 11/06/2015  0642   PROT 7.0 04/09/2015 0953   ALBUMIN 2.7* 07/20/2015 0440   AST 16 04/09/2015 0953   ALT 5* 04/09/2015 0953   ALKPHOS 50 04/09/2015 0953   BILITOT 0.3 04/09/2015 0953   GFRNONAA 50* 11/06/2015 0642   GFRAA 58* 11/06/2015 0642    Assessment and Plan  Pt is d/c to [facility] with HH/OT/PT/Nursing. Medications have been reconciled and rx's written.   Time spent > 30 min;> 50% of time with patient was spent reviewing records, labs, tests and studies, counseling and developing plan of care  Noah Delaine. Sheppard Coil, MD

## 2016-01-24 ENCOUNTER — Other Ambulatory Visit: Payer: Self-pay | Admitting: Internal Medicine

## 2016-02-18 ENCOUNTER — Other Ambulatory Visit: Payer: Self-pay | Admitting: Internal Medicine

## 2016-03-07 ENCOUNTER — Other Ambulatory Visit: Payer: Self-pay | Admitting: Internal Medicine

## 2016-03-09 ENCOUNTER — Other Ambulatory Visit: Payer: Self-pay

## 2016-03-09 ENCOUNTER — Telehealth: Payer: Self-pay | Admitting: Internal Medicine

## 2016-03-09 MED ORDER — ALBUTEROL SULFATE (5 MG/ML) 0.5% IN NEBU: 2.5000 mg | INHALATION_SOLUTION | Freq: Two times a day (BID) | RESPIRATORY_TRACT | 5 refills | Status: DC

## 2016-03-09 MED ORDER — ALBUTEROL SULFATE (2.5 MG/3ML) 0.083% IN NEBU: 2.5000 mg | INHALATION_SOLUTION | Freq: Four times a day (QID) | RESPIRATORY_TRACT | 3 refills | Status: DC | PRN

## 2016-03-09 NOTE — Telephone Encounter (Signed)
Rx was sent to [facility] from faxed request sent in from pharmacy. Pt has now spoken with pharmacy and requested lower dose albuterol be sent in. Rx sent. Pharmacy has been asked to inform pt that further refills need to come from PCP as per MW pt does not need pulmonary follow up.

## 2016-03-09 NOTE — Telephone Encounter (Signed)
error 

## 2016-03-09 NOTE — Addendum Note (Signed)
Addended by: Wynn Banker H on: 03/09/2016 11:10 AM   Modules accepted: Orders

## 2016-08-13 ENCOUNTER — Other Ambulatory Visit: Payer: Self-pay | Admitting: Internal Medicine

## 2016-08-15 ENCOUNTER — Other Ambulatory Visit: Payer: Self-pay | Admitting: Internal Medicine

## 2016-10-07 IMAGING — DX DG CHEST 2V
2 series · 2 of 2 positions shown · non-contrast
Comparison: 07/16/2015

CLINICAL DATA: Followup pneumonia.

EXAM:
CHEST  2 VIEW

[chest pa]
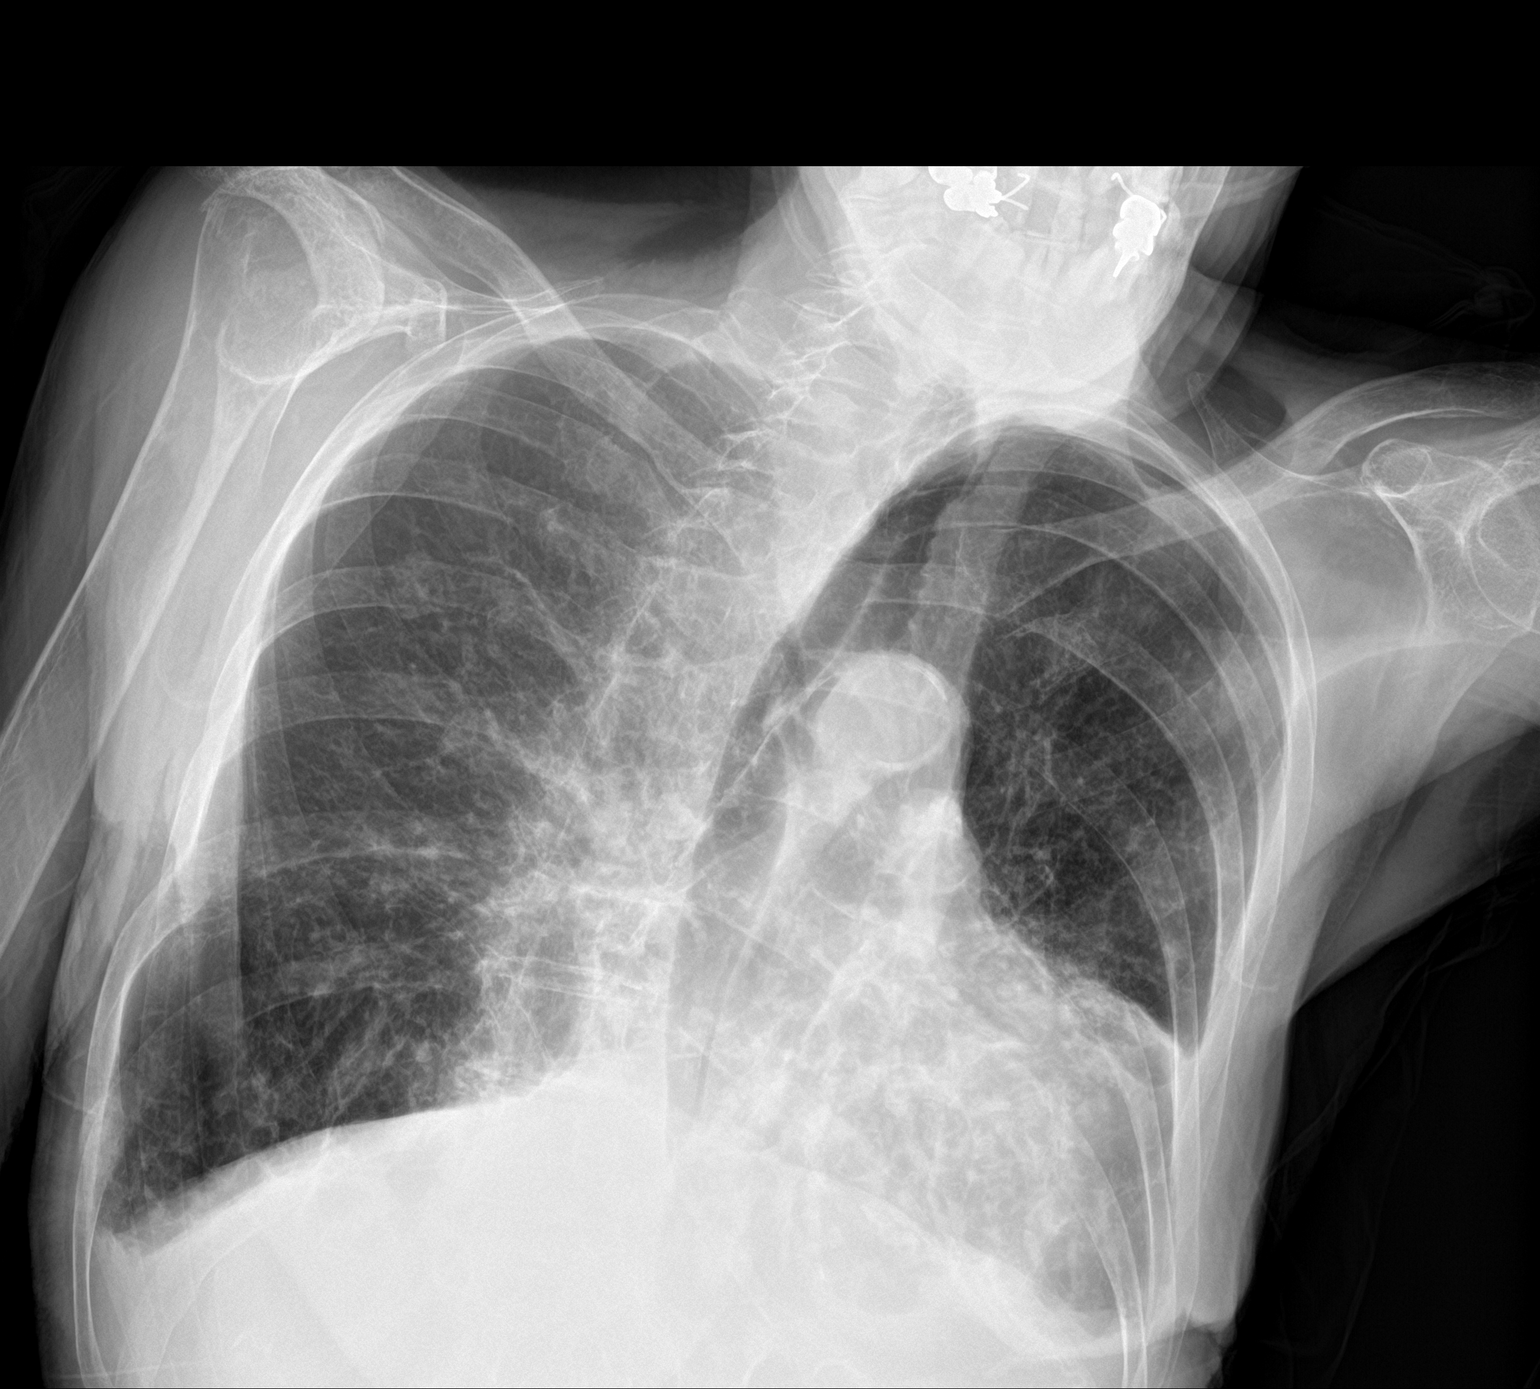

[chest lat]
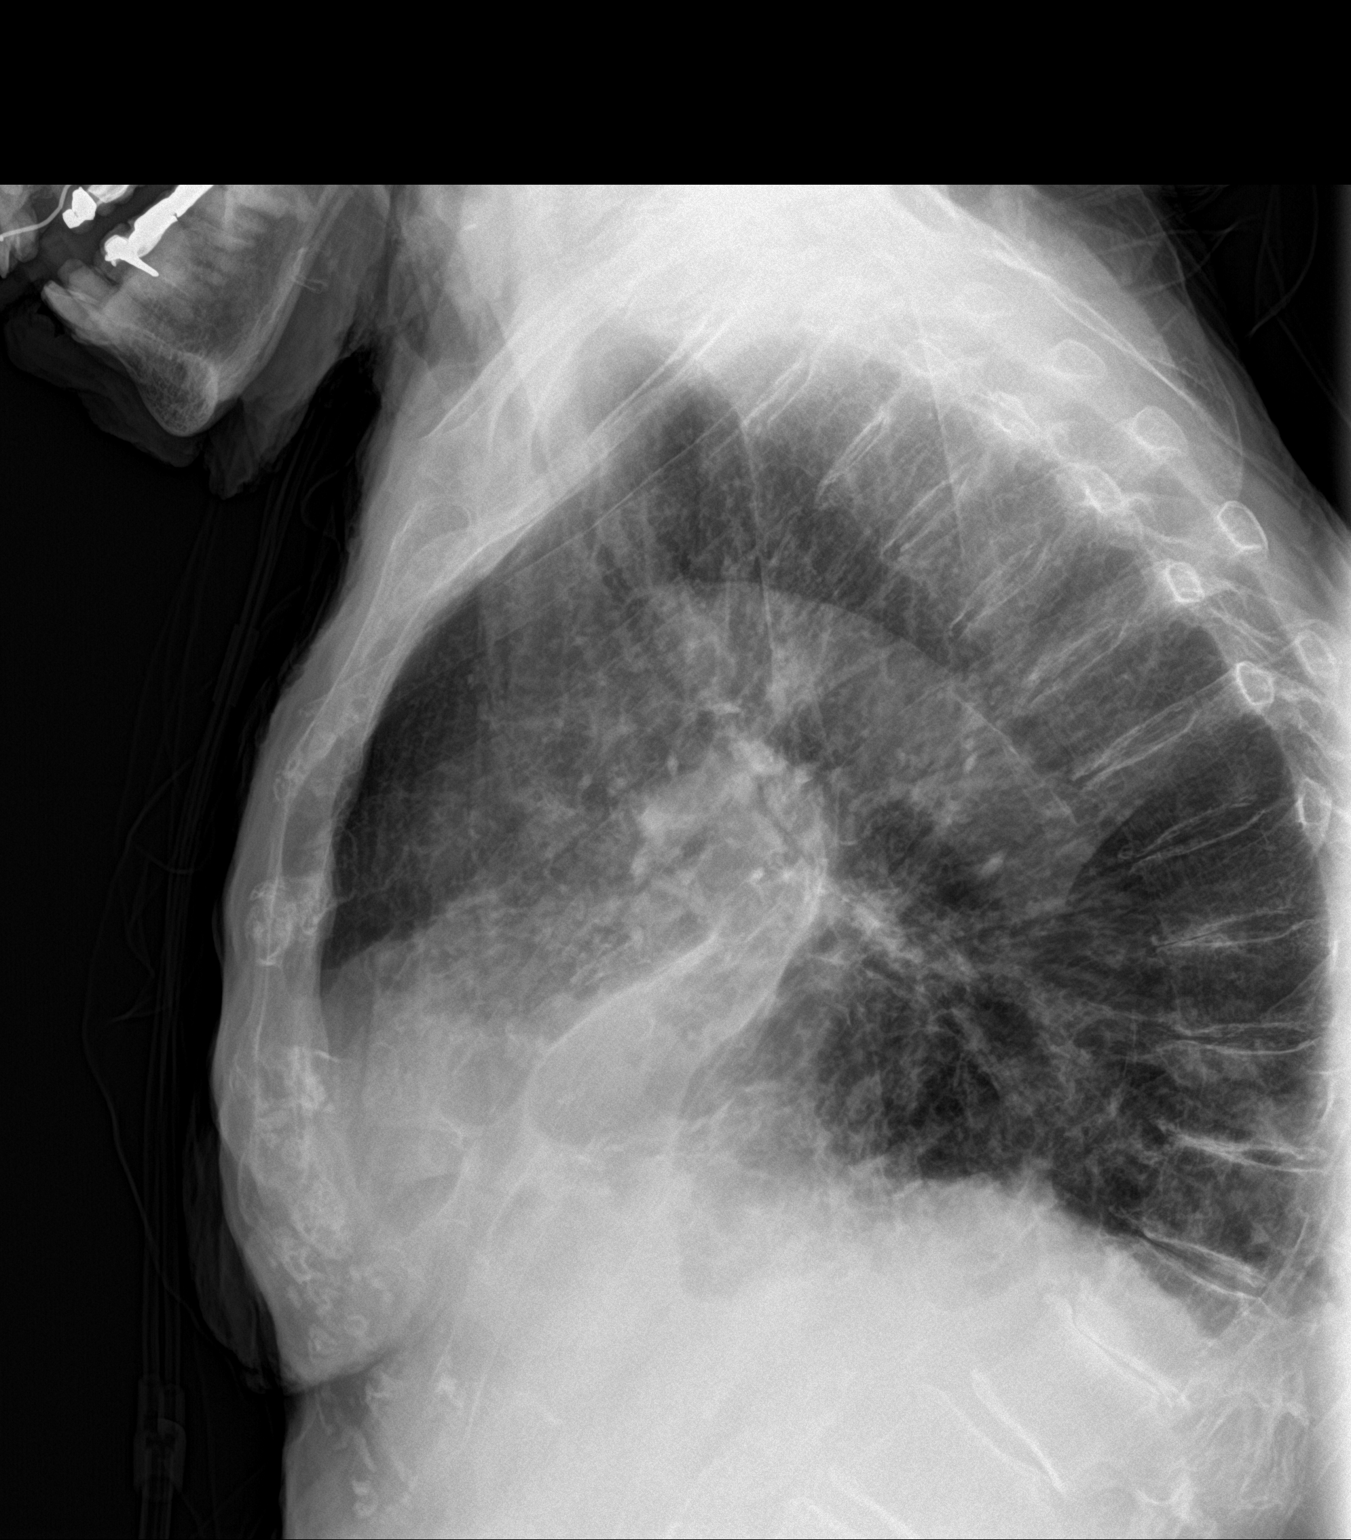

[2 of 2 positions shown; findings below may reference images not displayed]

FINDINGS: The heart is enlarged but stable. Stable tortuosity and
calcification of the thoracic aorta. Persistent but slightly
improved lingular pneumonia. The right lung remains clear. Suspect
emphysematous changes and bronchiectasis.
IMPRESSION: Slight improved lingular pneumonia.  Recommend continued follow-up

## 2016-10-21 IMAGING — DX DG CHEST 2V
2 series · 2 of 2 positions shown · non-contrast
Comparison: Chest x-ray of [REDACTED] [DATE],
and [DATE].

CLINICAL DATA: Follow-up of pneumonia, currently asymptomatic,
history of acute respiratory failure, COPD, bronchiectasis, and
breast malignancy

EXAM:
CHEST  2 VIEW

[chest pa]
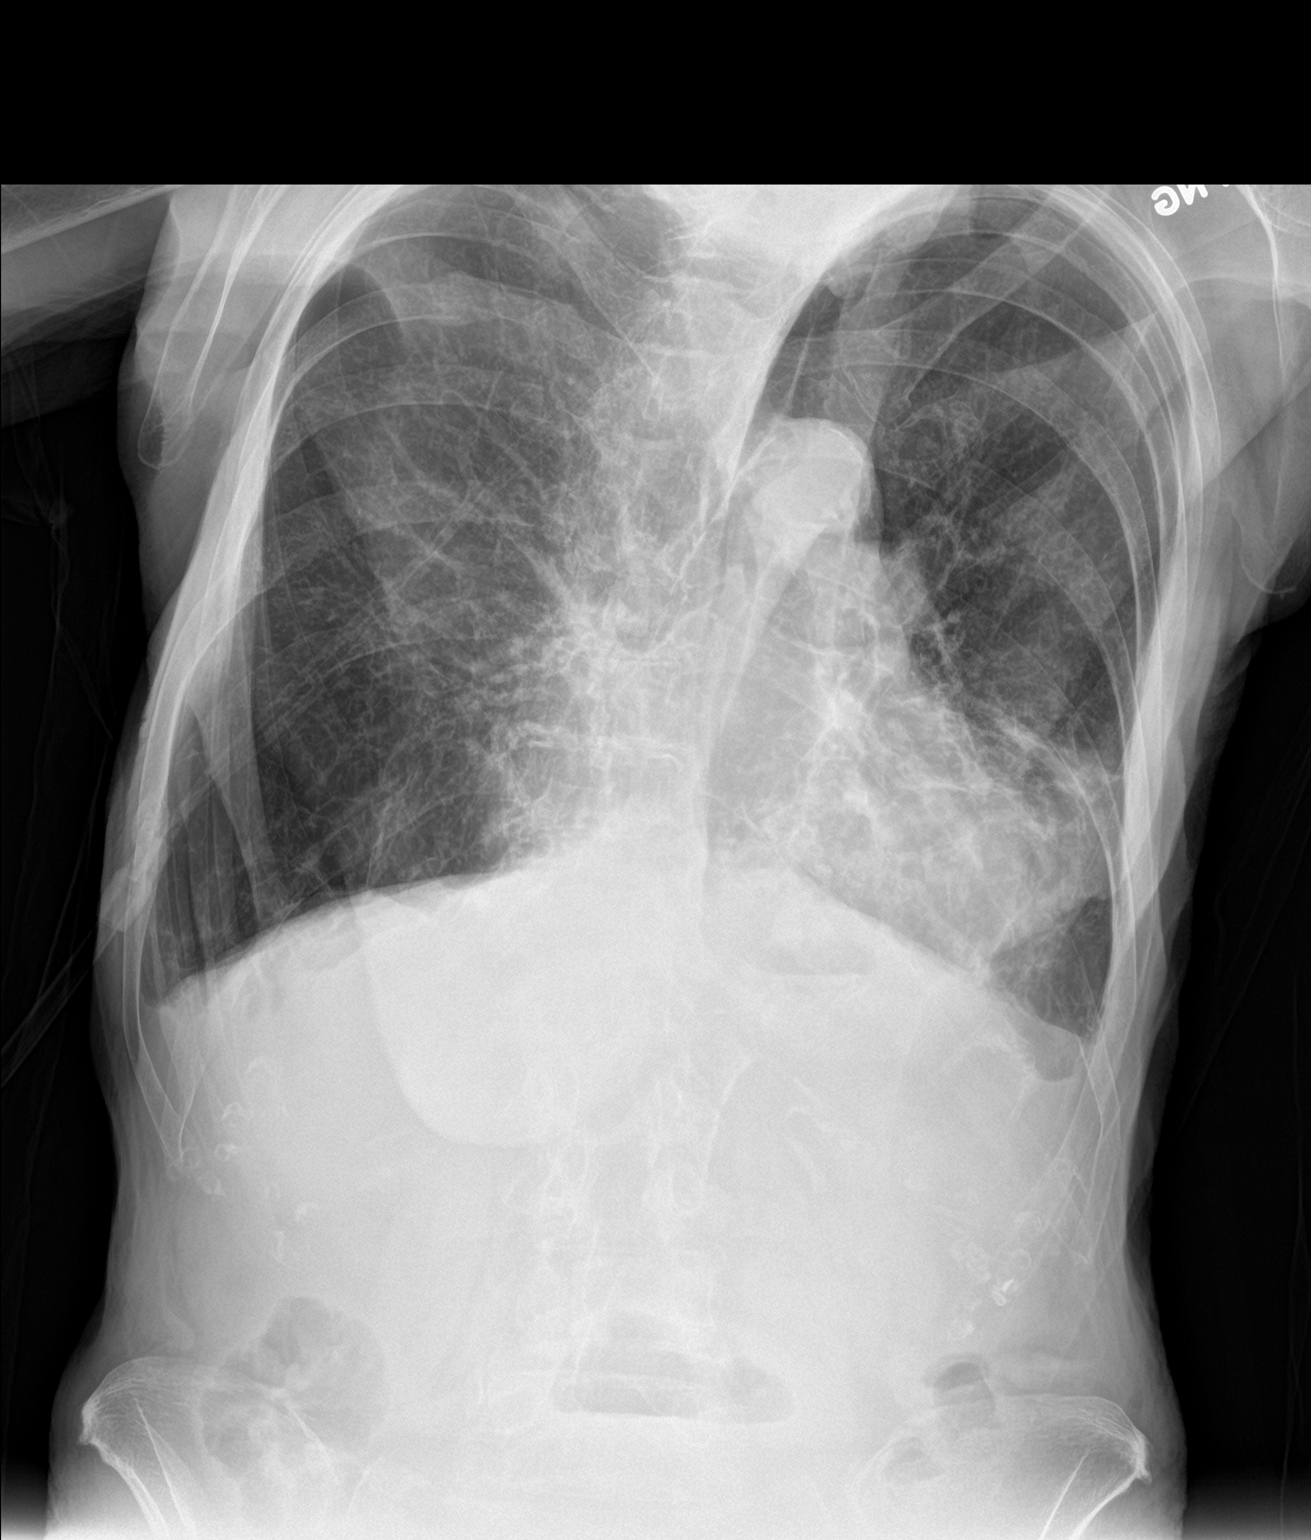

[chest lat]
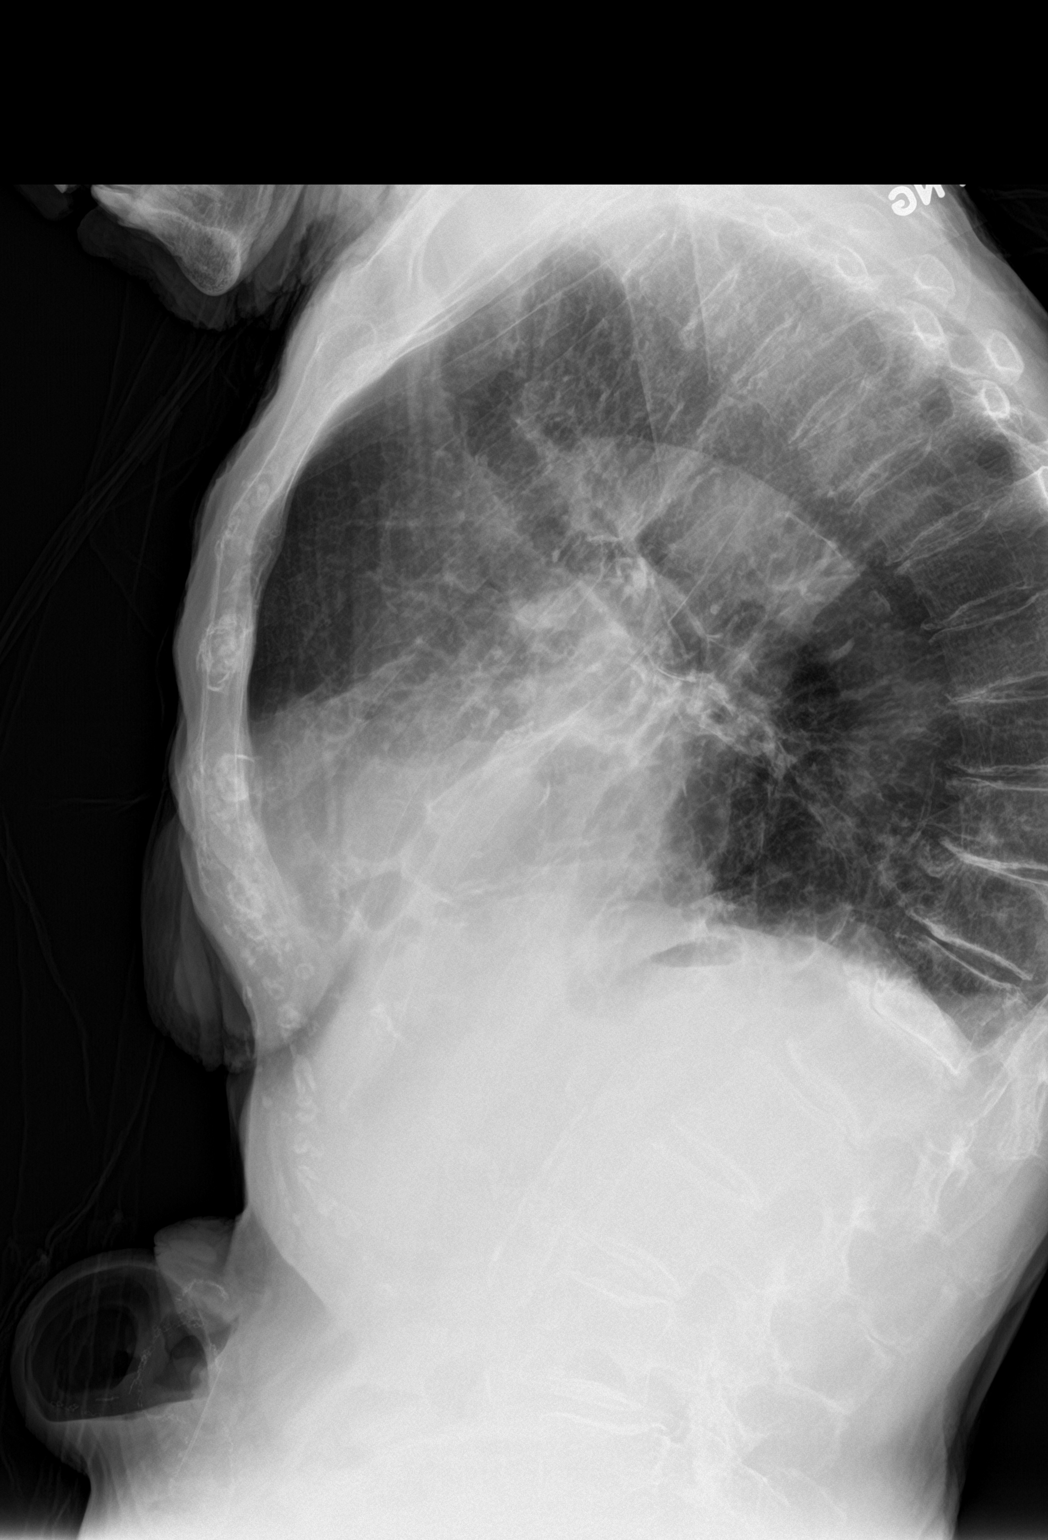

[2 of 2 positions shown; findings below may reference images not displayed]

FINDINGS: The patient is rotated on today's study. The lungs are
well-expanded. There is increased AP dimension of the thorax. There
remain coarse lung markings in the lingula not greatly changed from
the prior studies. The heart is not enlarged. The pulmonary
vascularity is not engorged. There is no pleural effusion or
pneumothorax. There is prominent thoracic kyphosis without
high-grade thoracic compression fracture.
IMPRESSION: COPD and chronic scarring and bronchiectatic change in the lingula.
There is no definite evidence of acute pneumonia and no evidence of
CHF.

## 2016-11-02 ENCOUNTER — Telehealth: Payer: Self-pay | Admitting: Internal Medicine

## 2016-11-02 DIAGNOSIS — J470 Bronchiectasis with acute lower respiratory infection: Secondary | ICD-10-CM

## 2016-11-02 DIAGNOSIS — J439 Emphysema, unspecified: Secondary | ICD-10-CM

## 2016-11-02 NOTE — Telephone Encounter (Signed)
That's fine

## 2016-11-02 NOTE — Telephone Encounter (Signed)
LMTCB

## 2016-11-02 NOTE — Telephone Encounter (Signed)
MW please advise if you are ok to send in order to [facility] for a nebulizer machine for this pt.  Pt was last seen on 09/13/15.  Thanks  Allergies  Allergen Reactions  . Zithromax [Azithromycin] Diarrhea  . Iodine Other (See Comments)    Unknown, might be rash/hives  . Ivp Dye [Iodinated Diagnostic Agents] Other (See Comments)    Unknown rash/hives maybe  . Shellfish Allergy Other (See Comments)    Unknown rash/hives, scallops

## 2016-11-03 ENCOUNTER — Telehealth: Payer: Self-pay | Admitting: Internal Medicine

## 2016-11-03 NOTE — Telephone Encounter (Signed)
Spoke with pt's husband, aware that new nebulizer has been ordered to [facility].  Nothing further needed.

## 2016-11-03 NOTE — Telephone Encounter (Signed)
Spoke with Loa Socks at [facility], aware that an order was placed at 9:00 this morning for replacement nebulizer.  Nothing further needed.

## 2016-11-14 ENCOUNTER — Emergency Department (HOSPITAL_COMMUNITY)
Admission: EM | Admit: 2016-11-14 | Discharge: 2016-11-14 | Disposition: A | Discharge status: Home / Self Care | Payer: Medicare Other | Attending: Emergency Medicine | Admitting: Emergency Medicine

## 2016-11-14 ENCOUNTER — Emergency Department (HOSPITAL_COMMUNITY): Payer: Medicare Other

## 2016-11-14 ENCOUNTER — Encounter (HOSPITAL_COMMUNITY): Payer: Self-pay | Admitting: Emergency Medicine

## 2016-11-14 DIAGNOSIS — Y999 Unspecified external cause status: Secondary | ICD-10-CM | POA: Insufficient documentation

## 2016-11-14 DIAGNOSIS — G20 Parkinson's disease: Secondary | ICD-10-CM | POA: Diagnosis not present

## 2016-11-14 DIAGNOSIS — Y939 Activity, unspecified: Secondary | ICD-10-CM | POA: Diagnosis not present

## 2016-11-14 DIAGNOSIS — E039 Hypothyroidism, unspecified: Secondary | ICD-10-CM | POA: Insufficient documentation

## 2016-11-14 DIAGNOSIS — J449 Chronic obstructive pulmonary disease, unspecified: Secondary | ICD-10-CM | POA: Diagnosis not present

## 2016-11-14 DIAGNOSIS — S61412A Laceration without foreign body of left hand, initial encounter: Secondary | ICD-10-CM | POA: Insufficient documentation

## 2016-11-14 DIAGNOSIS — Z85038 Personal history of other malignant neoplasm of large intestine: Secondary | ICD-10-CM | POA: Diagnosis not present

## 2016-11-14 DIAGNOSIS — S0101XA Laceration without foreign body of scalp, initial encounter: Secondary | ICD-10-CM | POA: Insufficient documentation

## 2016-11-14 DIAGNOSIS — Y9289 Other specified places as the place of occurrence of the external cause: Secondary | ICD-10-CM | POA: Insufficient documentation

## 2016-11-14 DIAGNOSIS — Z85828 Personal history of other malignant neoplasm of skin: Secondary | ICD-10-CM | POA: Diagnosis not present

## 2016-11-14 DIAGNOSIS — W19XXXA Unspecified fall, initial encounter: Secondary | ICD-10-CM | POA: Diagnosis not present

## 2016-11-14 DIAGNOSIS — Z79899 Other long term (current) drug therapy: Secondary | ICD-10-CM | POA: Insufficient documentation

## 2016-11-14 DIAGNOSIS — Z96642 Presence of left artificial hip joint: Secondary | ICD-10-CM | POA: Insufficient documentation

## 2016-11-14 NOTE — ED Triage Notes (Signed)
Pt is a resident at [facility]  In memory care unit and was found on floor about 10 mins after going back to her room from breakfast, pt does not remember falling, has lac to eyebrow left , left hand and back of left side of head bleeding controlled at this time , pt aaox 2,

## 2016-11-14 NOTE — ED Provider Notes (Signed)
[facility] DEPT Provider Note   CSN: [id number] Arrival date & time: 11/14/16  0947     History   Chief Complaint Chief Complaint  Patient presents with  . Fall  . Head Laceration   Level V caveat: Dementia  HPI Shannon Dunn is a 83 y.o. female.  HPI Patient presents to the emergency department with complaints of laceration to the left scalp and left periorbital region as well as left hand after a suspected fall.  She was seen leaving breakfast the next thing the staff at the memory care unit and she is found on the ground in her room.  She denies hip pain.  She denies headache.  She is not on anticoagulants.  Bleeding is controlled at this time.  Brought to the emergency department for evaluation.  Family reports baseline mental status.  Family reports that she's been in her normal state health recently.   Past Medical History:  Diagnosis Date  . Colon cancer ([place])   . COPD (chronic obstructive pulmonary disease) ([place])   . Dementia   . Osteoporosis   . Paget's bone disease   . Pagets disease, breast ([place])   . Parkinson's disease ([place])   . Pernicious anemia   . Renal disorder    Renal insufficiency  . Ventral hernia   . Vitamin D deficiency     Patient Active Problem List   Diagnosis Date Noted  . COPD (chronic obstructive pulmonary disease) ([place]) 12/03/2015  . Ecchymosis 11/27/2015  . Hypothyroidism 11/14/2015  . Protein-calorie malnutrition, severe 11/05/2015  . Femur fracture ([place]) 11/04/2015  . Hip fracture, left ([place]) 11/04/2015  . Chronic respiratory failure with hypoxia ([place]) 09/13/2015  . Physical deconditioning 07/19/2015  . Acute respiratory failure with hypoxia ([place]) 07/18/2015  . HCAP (healthcare-associated pneumonia) 07/16/2015  . Obstructive bronchiectasis ([place]) 04/29/2015  . H/O malignant neoplasm of skin 12/24/2014  . Cancer ([place]) 04/16/2014  . Mammary Pagets disease ([place]) 04/16/2014  . Cancer of nipple and areola of female breast ([place])  04/16/2014  . Bronchiectasis without acute exacerbation ([place]) 03/24/2013  . Pernicious anemia   . Dementia   . Vitamin D deficiency   . Osteoporosis   . Parkinson's disease ([place])   . Hernia of anterior abdominal wall 03/02/2013  . H/O nutritional disorder 01/15/2013  . Personal history of nutritional deficiency 01/15/2013    Past Surgical History:  Procedure Laterality Date  . BREAST SURGERY Left   . COLON SURGERY     bowel resection  . HIP ARTHROPLASTY Left 11/04/2015   Procedure: ARTHROPLASTY BIPOLAR HIP (HEMIARTHROPLASTY);  Surgeon: Tania Ade, MD;  Location: [facility];  Service: Orthopedics;  Laterality: Left;  . HIP PINNING,CANNULATED Left 11/04/2015   Procedure: CANNULATED HIP PINNING;  Surgeon: Tania Ade, MD;  Location: [place];  Service: Orthopedics;  Laterality: Left;    OB History    No data available       Home Medications    Prior to Admission medications   Medication Sig Start Date End Date Taking? Authorizing Provider  albuterol (PROVENTIL) (2.5 MG/3ML) 0.083% nebulizer solution Take 3 mLs (2.5 mg total) by nebulization every 6 (six) hours as needed for wheezing or shortness of breath. 03/09/16  Yes Tanda Rockers, MD  budesonide (PULMICORT) 0.25 MG/2ML nebulizer solution Take 0.25 mg by nebulization daily.    Yes Historical Provider, MD  Calcium-Vitamin D (CALTRATE 600 PLUS-VIT D PO) Take 1 tablet by mouth 2 (two) times daily.   Yes Historical Provider, MD  Carbidopa-Levodopa  ER (RYTARY) 48.75-195 MG CPCR Take 1 tablet by mouth 3 (three) times daily.   Yes Historical Provider, MD  cholecalciferol (VITAMIN D) 1000 units tablet Take 1,000 Units by mouth daily.   Yes Historical Provider, MD  feeding supplement (ENSURE IMMUNE HEALTH) LIQD Take 237 mLs by mouth 2 (two) times daily. VANILLA   Yes Historical Provider, MD  fluticasone (FLONASE) 50 MCG/ACT nasal spray Place 1 spray into both nostrils daily.   Yes Historical Provider, MD  GuaiFENesin (DIABETIC TUSSIN EX  PO) Take 5 mLs by mouth every 6 (six) hours as needed (congestion/cough.).   Yes Historical Provider, MD  Lactobacillus Acid-Pectin (ACIDOPHILUS PLUS PECTIN PO) Take 1 capsule by mouth daily.   Yes Historical Provider, MD  levothyroxine (SYNTHROID, LEVOTHROID) 25 MCG tablet Take 1 tablet (25 mcg total) by mouth daily before breakfast. 11/08/15  Yes Robbie Lis, MD  loperamide (IMODIUM) 2 MG capsule Take 2 mg by mouth daily as needed for diarrhea or loose stools.   Yes Historical Provider, MD  ranitidine (ZANTAC) 75 MG tablet Take 75 mg by mouth 2 (two) times daily.   Yes Historical Provider, MD  rasagiline (AZILECT) 1 MG TABS tablet Take 1 mg by mouth daily. At 2 pm.   Yes Historical Provider, MD  Rivastigmine (EXELON) 13.3 MG/24HR PT24 Place 1 patch onto the skin daily. Rotate sites   Yes Historical Provider, MD  rotigotine (NEUPRO) 4 MG/24HR Place 1 patch onto the skin daily. Remove old patch before applying new one.   Yes Historical Provider, MD  senna-docusate (SENOKOT-S) 8.6-50 MG tablet Take 1 tablet by mouth at bedtime as needed for mild constipation. 11/08/15  Yes Robbie Lis, MD  vitamin B-12 (CYANOCOBALAMIN) 500 MCG tablet Take 500 mcg by mouth daily.   Yes Historical Provider, MD  Wheat Dextrin (BENEFIBER PO) Take 1 scoop by mouth daily. In 120 ml fluid   Yes Historical Provider, MD  Maltodextrin-Xanthan Gum (RESOURCE THICKENUP CLEAR) POWD Take 120 g by mouth as needed. Patient not taking: Reported on 11/14/2016 11/08/15   Robbie Lis, MD    Family History Family History  Problem Relation Age of Onset  . Heart disease Mother   . Alzheimer's disease Father     Social History Social History  Substance Use Topics  . Smoking status: Never Smoker  . Smokeless tobacco: Never Used  . Alcohol use Yes     Comment: glass of wine maybe once a week     Allergies   Zithromax [azithromycin]; Iodine; Ivp dye [iodinated diagnostic agents]; and Shellfish allergy   Review of  Systems Review of Systems  Unable to perform ROS: Dementia     Physical Exam Updated Vital Signs BP 109/60 (BP Location: Left Arm)   Pulse 90   Temp 98.1 F (36.7 C) (Oral)   SpO2 96%   Physical Exam  Constitutional: She appears well-developed and well-nourished. No distress.  HENT:  Head: Normocephalic.  Superficial abrasion left periorbital rim without surrounding hematoma.  Extra movements are normal.  Small 0.5 cm parietal scalp laceration on the left  Eyes: EOM are normal. Pupils are equal, round, and reactive to light.  Neck: Normal range of motion.  Cardiovascular: Normal rate and regular rhythm.   Pulmonary/Chest: Effort normal and breath sounds normal.  Abdominal: Soft. She exhibits no distension. There is no tenderness.  Musculoskeletal: Normal range of motion.  4 motion bilateral shoulders, elbows, wrists.  Full range motion bilateral hips, knees, ankles.  Small skin tear at  the left third MCP joint without active bleeding.  Neurological: She is alert.  5/5 strength in major muscle groups of  bilateral upper and lower extremities. Speech normal. No facial asymetry.   Skin: Skin is warm and dry.  Psychiatric: She has a normal mood and affect. Judgment normal.  Nursing note and vitals reviewed.    ED Treatments / Results  Labs (all labs ordered are listed, but only abnormal results are displayed) Labs Reviewed - No data to display  EKG  EKG Interpretation None       Radiology Dg Hand Complete Left  Result Date: 11/14/2016 CLINICAL DATA:  Status post fall this morning with a laceration between the long and ring fingers of the left hand. Pain. Initial encounter. EXAM: LEFT HAND - COMPLETE 3+ VIEW COMPARISON:  None. FINDINGS: No acute bony or joint abnormality is identified. Bones are osteopenic. No radiopaque foreign body is seen. No notable arthropathy. IMPRESSION: No acute abnormality. Osteopenia. Electronically Signed   By: Inge Rise M.D.   On:  11/14/2016 10:49    Procedures .Marland KitchenLaceration Repair Performed by: Jola Schmidt Authorized by: Jola Schmidt    Consent: Verbal consent obtained. Risks and benefits: risks, benefits and alternatives were discussed Patient identity confirmed: provided demographic data Time out performed prior to procedure Prepped and Draped in normal sterile fashion Wound explored Laceration Location: left parietal scalp Laceration Length: 0.5cm No Foreign Bodies seen or palpated Anesthesia: none Irrigation method: syringe Amount of cleaning: standard Skin closure: staple Number of sutures or staples: 1 Technique: staple Patient tolerance: Patient tolerated the procedure well with no immediate complications.   LACERATION REPAIR #2 Performed by: Hoy Morn Consent: Verbal consent obtained. Risks and benefits: risks, benefits and alternatives were discussed Patient identity confirmed: provided demographic data Time out performed prior to procedure Prepped and Draped in normal sterile fashion Wound explored Laceration Location: left dor Laceration Length: 0.5cm No Foreign Bodies seen or palpated Anesthesia: none Irrigation method: syringe Amount of cleaning: standard Skin closure: tissue adhesive Number of sutures or staples: tissue adhesive Technique: tissue adhesive Patient tolerance: Patient tolerated the procedure well with no immediate complications.   Medications Ordered in ED Medications - No data to display   Initial Impression / Assessment and Plan / ED Course  I have reviewed the triage vital signs and the nursing notes.  Pertinent labs & imaging results that were available during my care of the patient were reviewed by me and considered in my medical decision making (see chart for details).     Patient is overall well-appearing.  No indication for CT imaging of the head and neck.  C-spine nontender.  No use of anticoagulants.  No vomiting.  No altered mental status.   Head injury warnings given.  Infection warnings given.  Staple place to left parietal scalp.  Tissue adhesive to left hand.  Patient family understand to return to the ER for new or worsening symptoms.  Full range of motion of major joints  Final Clinical Impressions(s) / ED Diagnoses   Final diagnoses:  Fall, initial encounter  Scalp laceration, initial encounter  Skin tear of left hand without complication, initial encounter    New Prescriptions New Prescriptions   No medications on file     Jola Schmidt, MD 11/14/16 1231

## 2016-11-14 NOTE — ED Notes (Signed)
Bed: IW97 Expected date:  Expected time:  Means of arrival:  Comments: EMS fall, dementia

## 2016-11-14 NOTE — Discharge Instructions (Signed)
Staple removal in 10 days.

## 2017-01-10 ENCOUNTER — Encounter (HOSPITAL_COMMUNITY): Payer: Self-pay

## 2017-01-10 ENCOUNTER — Inpatient Hospital Stay (HOSPITAL_COMMUNITY)
Admission: EM | Admit: 2017-01-10 | Discharge: 2017-01-25 | DRG: 871 | Disposition: A | Discharge status: Skilled Nursing Facility | Payer: Medicare Other | Attending: Internal Medicine | Admitting: Internal Medicine

## 2017-01-10 ENCOUNTER — Emergency Department (HOSPITAL_COMMUNITY): Payer: Medicare Other

## 2017-01-10 DIAGNOSIS — R6520 Severe sepsis without septic shock: Secondary | ICD-10-CM | POA: Diagnosis present

## 2017-01-10 DIAGNOSIS — F0390 Unspecified dementia without behavioral disturbance: Secondary | ICD-10-CM | POA: Diagnosis present

## 2017-01-10 DIAGNOSIS — I10 Essential (primary) hypertension: Secondary | ICD-10-CM | POA: Diagnosis present

## 2017-01-10 DIAGNOSIS — G9341 Metabolic encephalopathy: Secondary | ICD-10-CM | POA: Diagnosis present

## 2017-01-10 DIAGNOSIS — E876 Hypokalemia: Secondary | ICD-10-CM | POA: Diagnosis not present

## 2017-01-10 DIAGNOSIS — E039 Hypothyroidism, unspecified: Secondary | ICD-10-CM | POA: Diagnosis present

## 2017-01-10 DIAGNOSIS — Z66 Do not resuscitate: Secondary | ICD-10-CM | POA: Diagnosis present

## 2017-01-10 DIAGNOSIS — J189 Pneumonia, unspecified organism: Secondary | ICD-10-CM

## 2017-01-10 DIAGNOSIS — Y95 Nosocomial condition: Secondary | ICD-10-CM | POA: Diagnosis present

## 2017-01-10 DIAGNOSIS — J471 Bronchiectasis with (acute) exacerbation: Secondary | ICD-10-CM | POA: Diagnosis not present

## 2017-01-10 DIAGNOSIS — I700 Atherosclerosis of aorta: Secondary | ICD-10-CM | POA: Diagnosis present

## 2017-01-10 DIAGNOSIS — A4152 Sepsis due to Pseudomonas: Secondary | ICD-10-CM | POA: Diagnosis present

## 2017-01-10 DIAGNOSIS — E440 Moderate protein-calorie malnutrition: Secondary | ICD-10-CM | POA: Diagnosis present

## 2017-01-10 DIAGNOSIS — E038 Other specified hypothyroidism: Secondary | ICD-10-CM | POA: Diagnosis not present

## 2017-01-10 DIAGNOSIS — J479 Bronchiectasis, uncomplicated: Secondary | ICD-10-CM | POA: Diagnosis not present

## 2017-01-10 DIAGNOSIS — J151 Pneumonia due to Pseudomonas: Secondary | ICD-10-CM | POA: Diagnosis present

## 2017-01-10 DIAGNOSIS — F0280 Dementia in other diseases classified elsewhere without behavioral disturbance: Secondary | ICD-10-CM | POA: Diagnosis present

## 2017-01-10 DIAGNOSIS — Z91041 Radiographic dye allergy status: Secondary | ICD-10-CM

## 2017-01-10 DIAGNOSIS — Z79899 Other long term (current) drug therapy: Secondary | ICD-10-CM

## 2017-01-10 DIAGNOSIS — J9600 Acute respiratory failure, unspecified whether with hypoxia or hypercapnia: Secondary | ICD-10-CM

## 2017-01-10 DIAGNOSIS — D510 Vitamin B12 deficiency anemia due to intrinsic factor deficiency: Secondary | ICD-10-CM | POA: Diagnosis present

## 2017-01-10 DIAGNOSIS — Z85038 Personal history of other malignant neoplasm of large intestine: Secondary | ICD-10-CM

## 2017-01-10 DIAGNOSIS — M810 Age-related osteoporosis without current pathological fracture: Secondary | ICD-10-CM | POA: Diagnosis present

## 2017-01-10 DIAGNOSIS — G20 Parkinson's disease: Secondary | ICD-10-CM | POA: Diagnosis present

## 2017-01-10 DIAGNOSIS — Z91013 Allergy to seafood: Secondary | ICD-10-CM

## 2017-01-10 DIAGNOSIS — J181 Lobar pneumonia, unspecified organism: Secondary | ICD-10-CM

## 2017-01-10 DIAGNOSIS — J440 Chronic obstructive pulmonary disease with acute lower respiratory infection: Secondary | ICD-10-CM | POA: Diagnosis present

## 2017-01-10 DIAGNOSIS — T380X5A Adverse effect of glucocorticoids and synthetic analogues, initial encounter: Secondary | ICD-10-CM | POA: Diagnosis not present

## 2017-01-10 DIAGNOSIS — Z9981 Dependence on supplemental oxygen: Secondary | ICD-10-CM | POA: Diagnosis not present

## 2017-01-10 DIAGNOSIS — Z96642 Presence of left artificial hip joint: Secondary | ICD-10-CM | POA: Diagnosis present

## 2017-01-10 DIAGNOSIS — F0281 Dementia in other diseases classified elsewhere with behavioral disturbance: Secondary | ICD-10-CM

## 2017-01-10 DIAGNOSIS — Z515 Encounter for palliative care: Secondary | ICD-10-CM | POA: Diagnosis present

## 2017-01-10 DIAGNOSIS — Z853 Personal history of malignant neoplasm of breast: Secondary | ICD-10-CM | POA: Diagnosis not present

## 2017-01-10 DIAGNOSIS — J470 Bronchiectasis with acute lower respiratory infection: Secondary | ICD-10-CM | POA: Diagnosis present

## 2017-01-10 DIAGNOSIS — E559 Vitamin D deficiency, unspecified: Secondary | ICD-10-CM | POA: Diagnosis present

## 2017-01-10 DIAGNOSIS — Z8249 Family history of ischemic heart disease and other diseases of the circulatory system: Secondary | ICD-10-CM

## 2017-01-10 DIAGNOSIS — G309 Alzheimer's disease, unspecified: Secondary | ICD-10-CM | POA: Diagnosis not present

## 2017-01-10 DIAGNOSIS — E034 Atrophy of thyroid (acquired): Secondary | ICD-10-CM | POA: Diagnosis not present

## 2017-01-10 DIAGNOSIS — Z681 Body mass index (BMI) 19 or less, adult: Secondary | ICD-10-CM | POA: Diagnosis not present

## 2017-01-10 DIAGNOSIS — J9601 Acute respiratory failure with hypoxia: Secondary | ICD-10-CM | POA: Diagnosis present

## 2017-01-10 DIAGNOSIS — Z881 Allergy status to other antibiotic agents status: Secondary | ICD-10-CM

## 2017-01-10 DIAGNOSIS — A419 Sepsis, unspecified organism: Secondary | ICD-10-CM | POA: Diagnosis not present

## 2017-01-10 LAB — I-STAT TROPONIN, ED: Troponin i, poc: 0 ng/mL (ref 0.00–0.08)

## 2017-01-10 LAB — BLOOD GAS, VENOUS
Delivery systems: POSITIVE
Expiratory PAP: 6
FIO2: 50
Inspiratory PAP: 12
MODE: POSITIVE
PO2 VEN: 105 mmHg — AB (ref 32.0–45.0)
pCO2, Ven: 43.8 mmHg — ABNORMAL LOW (ref 44.0–60.0)
pH, Ven: 7.39 (ref 7.250–7.430)

## 2017-01-10 LAB — LACTIC ACID, PLASMA: LACTIC ACID, VENOUS: 0.7 mmol/L (ref 0.5–1.9)

## 2017-01-10 LAB — CBC WITH DIFFERENTIAL/PLATELET
BASOS ABS: 0 10*3/uL (ref 0.0–0.1)
Basophils Relative: 0 %
Eosinophils Absolute: 0 10*3/uL (ref 0.0–0.7)
Eosinophils Relative: 0 %
HEMATOCRIT: 34 % — AB (ref 36.0–46.0)
Hemoglobin: 11.1 g/dL — ABNORMAL LOW (ref 12.0–15.0)
LYMPHS PCT: 10 %
Lymphs Abs: 1.1 10*3/uL (ref 0.7–4.0)
MCH: 29.7 pg (ref 26.0–34.0)
MCHC: 32.6 g/dL (ref 30.0–36.0)
MCV: 90.9 fL (ref 78.0–100.0)
MONO ABS: 0.6 10*3/uL (ref 0.1–1.0)
Monocytes Relative: 5 %
NEUTROS ABS: 10 10*3/uL — AB (ref 1.7–7.7)
Neutrophils Relative %: 85 %
Platelets: 369 10*3/uL (ref 150–400)
RBC: 3.74 MIL/uL — ABNORMAL LOW (ref 3.87–5.11)
RDW: 16.3 % — AB (ref 11.5–15.5)
WBC: 11.8 10*3/uL — ABNORMAL HIGH (ref 4.0–10.5)

## 2017-01-10 LAB — BASIC METABOLIC PANEL
ANION GAP: 11 (ref 5–15)
BUN: 21 mg/dL — ABNORMAL HIGH (ref 6–20)
CALCIUM: 9 mg/dL (ref 8.9–10.3)
CO2: 26 mmol/L (ref 22–32)
Chloride: 97 mmol/L — ABNORMAL LOW (ref 101–111)
Creatinine, Ser: 0.9 mg/dL (ref 0.44–1.00)
GFR calc Af Amer: >60 mL/min (ref >60)
GFR calc non Af Amer: 55 mL/min — ABNORMAL LOW (ref >60)
GLUCOSE: 156 mg/dL — AB (ref 65–99)
POTASSIUM: 4.7 mmol/L (ref 3.5–5.1)
Sodium: 134 mmol/L — ABNORMAL LOW (ref 135–145)

## 2017-01-10 LAB — MRSA PCR SCREENING: MRSA by PCR: NEGATIVE

## 2017-01-10 LAB — I-STAT CG4 LACTIC ACID, ED: LACTIC ACID, VENOUS: 2.21 mmol/L — AB (ref 0.5–1.9)

## 2017-01-10 MED ORDER — DEXTROSE-NACL 5-0.45 % IV SOLN
INTRAVENOUS | Status: AC
  Administered 2017-01-10 – 2017-01-11 (×2): via INTRAVENOUS

## 2017-01-10 MED ORDER — VANCOMYCIN HCL IN DEXTROSE 1-5 GM/200ML-% IV SOLN: 1000.0000 mg | Freq: Once | INTRAVENOUS | Status: DC

## 2017-01-10 MED ORDER — IPRATROPIUM-ALBUTEROL 0.5-2.5 (3) MG/3ML IN SOLN
3.0000 mL | Freq: Once | RESPIRATORY_TRACT | Status: AC
  Administered 2017-01-10: 3 mL via RESPIRATORY_TRACT
  Filled 2017-01-10: qty 3

## 2017-01-10 MED ORDER — SODIUM CHLORIDE 0.9 % IV SOLN: 500.0000 mg | INTRAVENOUS | Status: DC

## 2017-01-10 MED ORDER — VANCOMYCIN HCL 500 MG IV SOLR
500.0000 mg | INTRAVENOUS | Status: DC
  Administered 2017-01-10 – 2017-01-11 (×2): 500 mg via INTRAVENOUS
  Filled 2017-01-10 (×3): qty 500

## 2017-01-10 MED ORDER — DEXTROSE 5 % IV SOLN
2.0000 g | Freq: Once | INTRAVENOUS | Status: AC
  Administered 2017-01-10: 2 g via INTRAVENOUS
  Filled 2017-01-10: qty 2

## 2017-01-10 MED ORDER — RIVASTIGMINE 13.3 MG/24HR TD PT24
13.3000 mg | MEDICATED_PATCH | Freq: Every day | TRANSDERMAL | Status: DC
  Administered 2017-01-10 – 2017-01-24 (×14): 13.3 mg via TRANSDERMAL
  Filled 2017-01-10 (×16): qty 1

## 2017-01-10 MED ORDER — ALBUTEROL SULFATE (2.5 MG/3ML) 0.083% IN NEBU
2.5000 mg | INHALATION_SOLUTION | Freq: Once | RESPIRATORY_TRACT | Status: AC
  Administered 2017-01-10: 2.5 mg via RESPIRATORY_TRACT
  Filled 2017-01-10: qty 3

## 2017-01-10 MED ORDER — LEVOTHYROXINE SODIUM 100 MCG IV SOLR
12.5000 ug | Freq: Every day | INTRAVENOUS | Status: DC
  Administered 2017-01-11 – 2017-01-13 (×2): 12.5 ug via INTRAVENOUS
  Filled 2017-01-10 (×2): qty 5

## 2017-01-10 MED ORDER — ROTIGOTINE 4 MG/24HR TD PT24
1.0000 | MEDICATED_PATCH | Freq: Every day | TRANSDERMAL | Status: DC
Start: 2017-01-10 — End: 2017-01-25
  Administered 2017-01-10 – 2017-01-22 (×12): 1 via TRANSDERMAL

## 2017-01-10 MED ORDER — LEVOTHYROXINE SODIUM 100 MCG IV SOLR: 25.0000 ug | Freq: Every day | INTRAVENOUS | Status: DC

## 2017-01-10 MED ORDER — METHYLPREDNISOLONE SODIUM SUCC 125 MG IJ SOLR: 125.0000 mg | Freq: Once | INTRAMUSCULAR | Status: DC

## 2017-01-10 MED ORDER — BUDESONIDE 0.25 MG/2ML IN SUSP
0.2500 mg | Freq: Every day | RESPIRATORY_TRACT | Status: DC
  Administered 2017-01-11 – 2017-01-18 (×8): 0.25 mg via RESPIRATORY_TRACT
  Filled 2017-01-10 (×8): qty 2

## 2017-01-10 MED ORDER — ALBUTEROL SULFATE (2.5 MG/3ML) 0.083% IN NEBU
2.5000 mg | INHALATION_SOLUTION | Freq: Four times a day (QID) | RESPIRATORY_TRACT | Status: DC | PRN
  Administered 2017-01-13 – 2017-01-22 (×9): 2.5 mg via RESPIRATORY_TRACT
  Filled 2017-01-10 (×6): qty 3

## 2017-01-10 MED ORDER — ALBUTEROL SULFATE (2.5 MG/3ML) 0.083% IN NEBU: 5.0000 mg | INHALATION_SOLUTION | Freq: Once | RESPIRATORY_TRACT | Status: DC

## 2017-01-10 MED ORDER — DEXTROSE 5 % IV SOLN
1.0000 g | INTRAVENOUS | Status: DC
  Filled 2017-01-10: qty 1

## 2017-01-10 MED ORDER — IPRATROPIUM BROMIDE 0.02 % IN SOLN: 0.5000 mg | Freq: Once | RESPIRATORY_TRACT | Status: DC

## 2017-01-10 NOTE — H&P (Addendum)
History and Physical    ZAYLI VILLAFUERTE UDJ:[id number] DOB: [birth year] DOA: 01/10/2017  PCP: Renata Caprice, DO Patient coming from: [place]   Chief Complaint: SOB.AMS  HPI: MEYLI BOICE is a 83 y.o. female with medical history significant of COPD/bronchiectasis, Parkinson's disease, Paget bone disease, osteoporosis, history of colon cancer was brought to the ER by the family due to concerns of abnormal breath sounds and decreased mentation. History is per family due to patient's mental status. Family states patient has had somewhat of a productive cough for the past 2 weeks and has been bringing up greenish mucus for couple weeks now. She has also felt febrile but did not check temperature. This morning when they went to pick up their niece, patient was noted to be more drowsy with increasing abdominal breath sounds therefore family decided to bring the patient to the ER for further evaluation. She lives at [facility] group home therefore could possibly have sick contacts. At baseline she is able to recognize family members and interactive through short conversations but there are periods of sundowning. Patient has been eating small bites and meals at a time but there is still some concerns of aspiration. Patient was admitted here last year in April for left hip fracture and at that time she was evaluated by speech and swallow who recommended her to be on dysphagia 1 diet.  In the ER patient was noted to be febrile with temperature of 101F. lactate of 2.2, elevated heart rate, tachypnea and signs of pneumonia on the chest x-ray. She was placed on BiPAP for Resp support and started on vancomycin and cefepime. She appeared much more comfortable on BiPAP and a repeat ABG showed improvement as well. First set of cardiac enzymes was negative.  Family agrees that patient is DO NOT RESUSCITATE DO NOT INTUBATE   Review of Systems: As per HPI otherwise 10 point review of systems negative.    Past Medical History:  Diagnosis Date  . Colon cancer ([place])   . COPD (chronic obstructive pulmonary disease) ([place])   . Dementia   . Osteoporosis   . Paget's bone disease   . 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 Parkinson's disease ([place])   . Pernicious anemia   . Renal disorder    Renal insufficiency  . Ventral hernia   . Vitamin D deficiency     Past Surgical History:  Procedure Laterality Date  . BREAST SURGERY Left   . COLON SURGERY     bowel resection  . HIP ARTHROPLASTY Left 11/04/2015   Procedure: ARTHROPLASTY BIPOLAR HIP (HEMIARTHROPLASTY);  Surgeon: Tania Ade, MD;  Location: [place];  Service: Orthopedics;  Laterality: Left;  . HIP PINNING,CANNULATED Left 11/04/2015   Procedure: CANNULATED HIP PINNING;  Surgeon: Tania Ade, MD;  Location: [place];  Service: Orthopedics;  Laterality: Left;     reports that she has never smoked. She has never used smokeless tobacco. She reports that she drinks alcohol. She reports that she does not use drugs.  Allergies  Allergen Reactions  . Zithromax [Azithromycin] Diarrhea  . Iodine Other (See Comments)    Unknown, might be rash/hives  . Ivp Dye [Iodinated Diagnostic Agents] Other (See Comments)    Unknown rash/hives maybe  . Shellfish Allergy Other (See Comments)    Unknown rash/hives, scallops     Family History  Problem Relation Age of Onset  . Heart disease Mother   . Alzheimer's disease Father     Acceptable: Family history reviewed  and not pertinent (If you reviewed it)  Prior to Admission medications   Medication Sig Start Date End Date Taking? Authorizing Provider  albuterol (PROVENTIL) (2.5 MG/3ML) 0.083% nebulizer solution Take 3 mLs (2.5 mg total) by nebulization every 6 (six) hours as needed for wheezing or shortness of breath. Patient taking differently: Take 2.5 mg by nebulization 2 (two) times daily.  03/09/16  Yes Tanda Rockers, MD  budesonide (PULMICORT) 0.25 MG/2ML nebulizer solution Take 0.25 mg by  nebulization daily after breakfast.    Yes [provider]  Calcium Citrate-Vitamin D 200-250 MG-UNIT TABS Take 3 tablets by mouth 2 (two) times daily.   Yes [provider]  Carbidopa-Levodopa ER (RYTARY) 48.75-195 MG CPCR Take 1 capsule by mouth 4 (four) times daily. Takes 1 capsule by mouth 4 times a day at 0000, 0800, 1400 and 1800   Yes [provider]  cholecalciferol (VITAMIN D) 1000 units tablet Take 3,000 Units by mouth daily with breakfast.    Yes [provider]  ENSURE (ENSURE) Take 237 mLs by mouth 2 (two) times daily between meals. *Vanilla*   Yes [provider]  fluticasone (FLONASE) 50 MCG/ACT nasal spray Place 1 spray into both nostrils daily after breakfast.    Yes [provider]  GuaiFENesin (DIABETIC TUSSIN EX PO) Take 5 mLs by mouth every 6 (six) hours as needed (congestion/cough.).   Yes [provider]  levothyroxine (SYNTHROID, LEVOTHROID) 25 MCG tablet Take 1 tablet (25 mcg total) by mouth daily before breakfast. 11/08/15  Yes Robbie Lis, MD  loperamide (IMODIUM) 2 MG capsule Take 2 mg by mouth daily as needed for diarrhea or loose stools.   Yes [provider]  Probiotic Product (ACIDOPHILUS PROBIOTIC BLEND) CAPS Take 1 capsule by mouth daily with breakfast.   Yes [provider]  ranitidine (ZANTAC) 75 MG tablet Take 75 mg by mouth 2 (two) times daily.   Yes [provider]  rasagiline (AZILECT) 1 MG TABS tablet Take 1 mg by mouth daily at 2 PM.    Yes [provider]  Rivastigmine (EXELON) 13.3 MG/24HR PT24 Place 1 patch onto the skin daily. Rotate sites   Yes [provider]  rotigotine (NEUPRO) 4 MG/24HR Place 1 patch onto the skin daily. Remove old patch before applying new one.   Yes [provider]  senna-docusate (SENOKOT-S) 8.6-50 MG tablet Take 1 tablet by mouth at bedtime as needed for mild constipation. 11/08/15  Yes Robbie Lis, MD  vitamin  B-12 (CYANOCOBALAMIN) 500 MCG tablet Take 500 mcg by mouth daily with breakfast.    Yes [provider]  Wheat Dextrin (BENEFIBER PO) Take 1 scoop by mouth daily with breakfast. Mix iIn 120 ml of fluid   Yes [provider]    Physical Exam: Vitals:   01/10/17 1300 01/10/17 1301 01/10/17 1325 01/10/17 1427  BP:  (!) 207/117  128/72  Pulse:  (!) 126 (!) 121 (!) 116  Resp:  (!) 36 (!) 31 (!) 21  Temp: (!) 101.1 F (38.4 C) (!) 101.1 F (38.4 C)    TempSrc: Rectal Rectal    SpO2:    98%  Weight:  38.1 kg (84 lb)    Height:  4\' 8"  (1.422 m)        Constitutional: NAD, calm, comfortable On BiPAP; drowsy but arousable; cachectic frail-appearing Vitals:   01/10/17 1300 01/10/17 1301 01/10/17 1325 01/10/17 1427  BP:  (!) 207/117  128/72  Pulse:  Marland Kitchen)  126 (!) 121 (!) 116  Resp:  (!) 36 (!) 31 (!) 21  Temp: (!) 101.1 F (38.4 C) (!) 101.1 F (38.4 C)    TempSrc: Rectal Rectal    SpO2:    98%  Weight:  38.1 kg (84 lb)    Height:  4\' 8"  (1.422 m)     Eyes: PERRL, lids and conjunctivae normal ENMT: Mucous membranes are moist. Posterior pharynx clear of any exudate or lesions.Normal dentition.  Neck: normal, supple, no masses, no thyromegaly Respiratory: clear to auscultation bilaterally, no wheezing, no crackles. Normal respiratory effort. No accessory muscle use.  Cardiovascular: Regular rate and rhythm, no murmurs / rubs / gallops. No extremity edema. 2+ pedal pulses. No carotid bruits.  Abdomen: no tenderness, no masses palpated. No hepatosplenomegaly. Bowel sounds positive.  Musculoskeletal: no clubbing / cyanosis. No joint deformity upper and lower extremities. Good ROM, no contractures. Normal muscle tone.  Skin: no rashes, lesions, ulcers. No induration Neurologic: CN 2-12 grossly intact. Sensation intact, DTR normal. Strength 5/5 in all 4.  Psychiatric: Normal judgment and insight. Alert and oriented x 1. Normal mood.     Labs on Admission: I have  personally reviewed following labs and imaging studies  CBC:  Recent Labs Lab 01/10/17 1226  WBC 11.8*  NEUTROABS 10.0*  HGB 11.1*  HCT 34.0*  MCV 90.9  PLT 151   Basic Metabolic Panel:  Recent Labs Lab 01/10/17 1226  NA 134*  K 4.7  CL 97*  CO2 26  GLUCOSE 156*  BUN 21*  CREATININE 0.90  CALCIUM 9.0   GFR: Estimated Creatinine Clearance: 23.8 mL/min (by C-G formula based on SCr of 0.9 mg/dL). Liver Function Tests: No results for input(s): AST, ALT, ALKPHOS, BILITOT, PROT, ALBUMIN in the last 168 hours. No results for input(s): LIPASE, AMYLASE in the last 168 hours. No results for input(s): AMMONIA in the last 168 hours. Coagulation Profile: No results for input(s): INR, PROTIME in the last 168 hours. Cardiac Enzymes: No results for input(s): CKTOTAL, CKMB, CKMBINDEX, TROPONINI in the last 168 hours. BNP (last 3 results) No results for input(s): PROBNP in the last 8760 hours. HbA1C: No results for input(s): HGBA1C in the last 72 hours. CBG: No results for input(s): GLUCAP in the last 168 hours. Lipid Profile: No results for input(s): CHOL, HDL, LDLCALC, TRIG, CHOLHDL, LDLDIRECT in the last 72 hours. Thyroid Function Tests: No results for input(s): TSH, T4TOTAL, FREET4, T3FREE, THYROIDAB in the last 72 hours. Anemia Panel: No results for input(s): VITAMINB12, FOLATE, FERRITIN, TIBC, IRON, RETICCTPCT in the last 72 hours. Urine analysis:    Component Value Date/Time   COLORURINE YELLOW 04/09/2015 [place] 04/09/2015 1355   LABSPEC 1.019 04/09/2015 1355   PHURINE 6.5 04/09/2015 1355   GLUCOSEU NEGATIVE 04/09/2015 1355   HGBUR NEGATIVE 04/09/2015 1355   BILIRUBINUR NEGATIVE 04/09/2015 1355   KETONESUR NEGATIVE 04/09/2015 1355   PROTEINUR NEGATIVE 04/09/2015 1355   UROBILINOGEN 0.2 04/09/2015 1355   NITRITE NEGATIVE 04/09/2015 1355   LEUKOCYTESUR NEGATIVE 04/09/2015 1355   Sepsis Labs:  !!!!!!!!!!!!!!!!!!!!!!!!!!!!!!!!!!!!!!!!!!!! @LABRCNTIP (procalcitonin:4,lacticidven:4) )No results found for this or any previous visit (from the past 240 hour(s)).   Radiological Exams on Admission: Dg Chest Port 1 View  Result Date: 01/10/2017 CLINICAL DATA:  Shortness of breath.  Cough and congestion. EXAM: PORTABLE CHEST 1 VIEW COMPARISON:  11/08/2015 .  07/30/2015 . FINDINGS: Heart size normal. Left base infiltrate with left-sided pleural effusion. Component of scarring may be present. Interstitial changes are again  noted most likely chronic. No acute bony abnormality. Degenerative changes noted of the thoracic spine and both shoulders. IMPRESSION: 1. Left lower lobe infiltrate with left-sided pleural effusion. Component these changes may be related scarring. 2. Chronic interstitial lung disease . Electronically Signed   By: Marcello Moores  Register   On: 01/10/2017 12:58    EKG: Independently reviewed.   Assessment/Plan Active Problems:   * No active hospital problems. *    Sepsis secondary to HCAP -Admit to inpatient and she is on BiPAP, sepsis protocol initiated -Cultures ordered, follow-up. Chest x-ray shows left lower lobe infiltrate with left-sided pleural effusion -Ordered vancomycin and cefepime -Wean her off BiPAP as she improves -Maintain nothing by mouth status. Will need speech and swallow eval -Aspiration precaution -Gentle IV fluids and monitor urine output  Acute metabolic encephalopathy -Likely secondary to pneumonia -No need for radiologic imaging. Continue neurochecks and maintain nothing by mouth status until improvement noted  COPD/bronchiectasis -Continue BiPAP and supplemental oxygen as needed -Use inhaler/nebulizer as needed  History of Parkinson disease -Holding by mouth medications  Hypothyroidism -We'll change by mouth to IV Synthroid  History of dementia  History of osteoporosis on vitamin D supplements   DVT prophylaxis: Subcutaneous heparin Code  Status: DO NOT RESUSCITATE Family Communication: Family at bedside- daughter, husband, niece Disposition Plan: To be determined Consults called: None Admission status: Inpatient admission to stepdown as she is on BiPAP   Vernel Donlan Arsenio Loader MD [facility]   If 7PM-7AM, please contact night-coverage [email or web address] Password [email or web address]  01/10/2017, 3:21 PM

## 2017-01-10 NOTE — ED Provider Notes (Signed)
[facility] DEPT Provider Note   CSN: [id number] Arrival date & time: 01/10/17  1211     History   Chief Complaint Chief Complaint  Patient presents with  . Shortness of Breath    HPI Shannon Dunn is a 83 y.o. female.  Level V caveat: Dementia.   Shannon Dunn is a 83 y.o. Female with history of dementia, COPD, and PNA who presents to the ED from her SNF [facility] via EMS with SOB and increased sleepiness since today. EMS provided the patient with breathing treatment. She is not typically on oxygen. Family is later at bedside and reports she was doing well yesterday, but today has had increased work of breathing, shortness of breath and is not as alert. He reports she's had pneumonia several times before. Family reports she would not like CPR or intubation. Patient tells me she feels bad, but is unable to answer other questions.    The history is provided by the patient, the EMS personnel and a relative. The history is limited by the condition of the patient. No language interpreter was used.  Shortness of Breath     Past Medical History:  Diagnosis Date  . Colon cancer ([place])   . COPD (chronic obstructive pulmonary disease) ([place])   . Dementia   . Osteoporosis   . Paget's bone disease   . 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 Parkinson's disease ([place])   . Pernicious anemia   . Renal disorder    Renal insufficiency  . Ventral hernia   . Vitamin D deficiency     Patient Active Problem List   Diagnosis Date Noted  . COPD (chronic obstructive pulmonary disease) ([place]) 12/03/2015  . Ecchymosis 11/27/2015  . Hypothyroidism 11/14/2015  . Protein-calorie malnutrition, severe 11/05/2015  . Femur fracture ([place]) 11/04/2015  . Hip fracture, left ([place]) 11/04/2015  . Chronic respiratory failure with hypoxia ([place]) 09/13/2015  . Physical deconditioning 07/19/2015  . Acute respiratory failure with hypoxia ([place]) 07/18/2015  . HCAP (healthcare-associated pneumonia)  07/16/2015  . Obstructive bronchiectasis ([place]) 04/29/2015  . H/O malignant neoplasm of skin 12/24/2014  . Cancer ([place]) 04/16/2014  . Mammary Pagets disease ([place]) 04/16/2014  . Cancer of nipple and areola of female breast ([place]) 04/16/2014  . Bronchiectasis without acute exacerbation ([place]) 03/24/2013  . Pernicious anemia   . Dementia   . Vitamin D deficiency   . Osteoporosis   . Parkinson's disease ([place])   . Hernia of anterior abdominal wall 03/02/2013  . H/O nutritional disorder 01/15/2013  . Personal history of nutritional deficiency 01/15/2013    Past Surgical History:  Procedure Laterality Date  . BREAST SURGERY Left   . COLON SURGERY     bowel resection  . HIP ARTHROPLASTY Left 11/04/2015   Procedure: ARTHROPLASTY BIPOLAR HIP (HEMIARTHROPLASTY);  Surgeon: Tania Ade, MD;  Location: [facility];  Service: Orthopedics;  Laterality: Left;  . HIP PINNING,CANNULATED Left 11/04/2015   Procedure: CANNULATED HIP PINNING;  Surgeon: Tania Ade, MD;  Location: [facility];  Service: Orthopedics;  Laterality: Left;    OB History    No data available       Home Medications    Prior to Admission medications   Medication Sig Start Date End Date Taking? Authorizing Provider  albuterol (PROVENTIL) (2.5 MG/3ML) 0.083% nebulizer solution Take 3 mLs (2.5 mg total) by nebulization every 6 (six) hours as needed for wheezing or shortness of breath. Patient taking differently: Take 2.5 mg by nebulization 2 (two) times daily.  03/09/16  Yes Tanda Rockers, MD  budesonide (PULMICORT) 0.25 MG/2ML nebulizer solution Take 0.25 mg by nebulization daily after breakfast.    Yes [provider]  Calcium Citrate-Vitamin D 200-250 MG-UNIT TABS Take 3 tablets by mouth 2 (two) times daily.   Yes [provider]  Carbidopa-Levodopa ER (RYTARY) 48.75-195 MG CPCR Take 1 capsule by mouth 4 (four) times daily. Takes 1 capsule by mouth 4 times a day at 0000, 0800, 1400 and 1800   Yes [provider]  cholecalciferol (VITAMIN D) 1000 units tablet Take 3,000 Units by mouth daily with breakfast.    Yes [provider]  ENSURE (ENSURE) Take 237 mLs by mouth 2 (two) times daily between meals. *Vanilla*   Yes [provider]  fluticasone (FLONASE) 50 MCG/ACT nasal spray Place 1 spray into both nostrils daily after breakfast.    Yes [provider]  GuaiFENesin (DIABETIC TUSSIN EX PO) Take 5 mLs by mouth every 6 (six) hours as needed (congestion/cough.).   Yes [provider]  levothyroxine (SYNTHROID, LEVOTHROID) 25 MCG tablet Take 1 tablet (25 mcg total) by mouth daily before breakfast. 11/08/15  Yes Robbie Lis, MD  loperamide (IMODIUM) 2 MG capsule Take 2 mg by mouth daily as needed for diarrhea or loose stools.   Yes [provider]  Probiotic Product (ACIDOPHILUS PROBIOTIC BLEND) CAPS Take 1 capsule by mouth daily with breakfast.   Yes [provider]  ranitidine (ZANTAC) 75 MG tablet Take 75 mg by mouth 2 (two) times daily.   Yes [provider]  rasagiline (AZILECT) 1 MG TABS tablet Take 1 mg by mouth daily at 2 PM.    Yes [provider]  Rivastigmine (EXELON) 13.3 MG/24HR PT24 Place 1 patch onto the skin daily. Rotate sites   Yes [provider]  rotigotine (NEUPRO) 4 MG/24HR Place 1 patch onto the skin daily. Remove old patch before applying new one.   Yes [provider]  senna-docusate (SENOKOT-S) 8.6-50 MG tablet Take 1 tablet by mouth at bedtime as needed for mild constipation. 11/08/15  Yes Robbie Lis, MD  vitamin B-12 (CYANOCOBALAMIN) 500 MCG tablet Take 500 mcg by mouth daily with breakfast.    Yes [provider]  Wheat Dextrin (BENEFIBER PO) Take 1 scoop by mouth daily with breakfast. Mix iIn 120 ml of fluid   Yes [provider]    Family History Family History  Problem Relation Age of Onset  . Heart disease Mother   . Alzheimer's disease Father      Social History Social History  Substance Use Topics  . Smoking status: Never Smoker  . Smokeless tobacco: Never Used  . Alcohol use Yes     Comment: glass of wine maybe once a week     Allergies   Zithromax [azithromycin]; Iodine; Ivp dye [iodinated diagnostic agents]; and Shellfish allergy   Review of Systems Review of Systems  Unable to perform ROS: Dementia  Respiratory: Positive for shortness of breath.      Physical Exam Updated Vital Signs BP 118/65   Pulse (!) 106   Temp (!) 101.1 F (38.4 C) (Rectal)   Resp (!) 22   Ht 4\' 8"  (1.422 m)   Wt 38.1 kg (84 lb)   SpO2 97%   BMI 18.83 kg/m   Physical Exam  Constitutional: She appears well-developed.  Chronically ill-appearing. Increase work of breathing.  HENT:  Head: Normocephalic and atraumatic.  Mouth/Throat: Oropharynx is clear and  moist.  Eyes: Pupils are equal, round, and reactive to light. Right eye exhibits no discharge. Left eye exhibits no discharge.  Neck: Neck supple.  Cardiovascular: Regular rhythm, normal heart sounds and intact distal pulses.  Exam reveals no gallop.   Heart rate of 120 exam. Regular rhythm.  Pulmonary/Chest: She has wheezes. She has rales.  Wheezes and crackles noted diffusely. Increased work of breathing with respirations of 30. Speaking in sentences.   Abdominal: Soft. There is no tenderness. There is no guarding.  Musculoskeletal: She exhibits edema. She exhibits no deformity.  Bilateral lower extremity edema.   Lymphadenopathy:    She has no cervical adenopathy.  Neurological: She is alert.  Alert and demented. Not able to participate with history.   Skin: Skin is warm. Capillary refill takes less than 2 seconds. No rash noted. No erythema. There is pallor.  Warm to touch. Slightly pale. Good capillary refill.   Psychiatric: She has a normal mood and affect. Her behavior is normal.  Nursing note and vitals reviewed.    ED Treatments / Results  Labs (all labs  ordered are listed, but only abnormal results are displayed) Labs Reviewed  CBC WITH DIFFERENTIAL/PLATELET - Abnormal; Notable for the following:       Result Value   WBC 11.8 (*)    RBC 3.74 (*)    Hemoglobin 11.1 (*)    HCT 34.0 (*)    RDW 16.3 (*)    Neutro Abs 10.0 (*)    All other components within normal limits  BASIC METABOLIC PANEL - Abnormal; Notable for the following:    Sodium 134 (*)    Chloride 97 (*)    Glucose, Bld 156 (*)    BUN 21 (*)    GFR calc non Af Amer 55 (*)    All other components within normal limits  BLOOD GAS, VENOUS - Abnormal; Notable for the following:    pCO2, Ven 43.8 (*)    pO2, Ven 105.0 (*)    All other components within normal limits  I-STAT CG4 LACTIC ACID, ED - Abnormal; Notable for the following:    Lactic Acid, Venous 2.21 (*)    All other components within normal limits  CULTURE, BLOOD (ROUTINE X 2)  CULTURE, BLOOD (ROUTINE X 2)  URINALYSIS, ROUTINE W REFLEX MICROSCOPIC  LACTIC ACID, PLASMA  I-STAT TROPOININ, ED    EKG  EKG Interpretation None       Radiology Dg Chest Port 1 View  Result Date: 01/10/2017 CLINICAL DATA:  Shortness of breath.  Cough and congestion. EXAM: PORTABLE CHEST 1 VIEW COMPARISON:  11/08/2015 .  07/30/2015 . FINDINGS: Heart size normal. Left base infiltrate with left-sided pleural effusion. Component of scarring may be present. Interstitial changes are again noted most likely chronic. No acute bony abnormality. Degenerative changes noted of the thoracic spine and both shoulders. IMPRESSION: 1. Left lower lobe infiltrate with left-sided pleural effusion. Component these changes may be related scarring. 2. Chronic interstitial lung disease . Electronically Signed   By: Marcello Moores  Register   On: 01/10/2017 12:58    Procedures Procedures (including critical care time)  CRITICAL CARE Performed by: Hanley Hays   Total critical care time: 55 minutes  Critical care time was exclusive of separately  billable procedures and treating other patients.  Critical care was necessary to treat or prevent imminent or life-threatening deterioration.  Critical care was time spent personally by me on the following activities: development of treatment plan with patient and/or surrogate as  well as nursing, discussions with consultants, evaluation of patient's response to treatment, examination of patient, obtaining history from patient or surrogate, ordering and performing treatments and interventions, ordering and review of laboratory studies, ordering and review of radiographic studies, pulse oximetry and re-evaluation of patient's condition.   Medications Ordered in ED Medications  ceFEPIme (MAXIPIME) 1 g in dextrose 5 % 50 mL IVPB (not administered)  vancomycin (VANCOCIN) 500 mg in sodium chloride 0.9 % 100 mL IVPB (500 mg Intravenous New Bag/Given 01/10/17 1502)  ipratropium-albuterol (DUONEB) 0.5-2.5 (3) MG/3ML nebulizer solution 3 mL (3 mLs Nebulization Given 01/10/17 1324)  albuterol (PROVENTIL) (2.5 MG/3ML) 0.083% nebulizer solution 2.5 mg (2.5 mg Nebulization Given 01/10/17 1324)  ceFEPIme (MAXIPIME) 2 g in dextrose 5 % 50 mL IVPB (0 g Intravenous Stopped 01/10/17 1502)     Initial Impression / Assessment and Plan / ED Course  I have reviewed the triage vital signs and the nursing notes.  Pertinent labs & imaging results that were available during my care of the patient were reviewed by me and considered in my medical decision making (see chart for details).     This  is a 83 y.o. Female with history of dementia, COPD, and PNA who presents to the ED from her SNF [facility] via EMS with SOB and increased sleepiness since today. EMS provided the patient with breathing treatment. She is not typically on oxygen. Family is later at bedside and reports she was doing well yesterday, but today has had increased work of breathing, shortness of breath and is not as alert. He reports she's had pneumonia  several times before. Family reports she would not like CPR or intubation. Patient tells me she feels bad, but is unable to answer other questions. On arrival patient is to keep neck and has increased work of breathing. Rectal temperature is 101.1. She is tachycardic with a heart rate of 120. Wheezes and crackles noted diffusely. Code sepsis activated. Patient's lactic is 2.21. Portable chest x-ray reveals multifocal pneumonia. Will start healthcare associated pneumonia antibiotics with cefepime and vancomycin. Patient started on BiPAP. At reevaluation she is tolerating BiPAP well. Her heart rate is improving and her respirations are improved. Oxygen saturation is 100% while on BiPAP. She tolerates the BiPAP well. I had an extensive discussion with the family about her wishes. They would not like CPR or intubation. They're okay with BiPAP and antibiotics. They are in agreement with admission.   I consulted with hospitalist Dr. Reesa Chew who accepted the patient for admission.   This patient was discussed with and evaluated by Dr. Cathleen Fears who agrees with assessment and plan.  Final Clinical Impressions(s) / ED Diagnoses   Final diagnoses:  Sepsis, due to unspecified organism ([place])  HCAP (healthcare-associated pneumonia)    New Prescriptions New Prescriptions   No medications on file     Waynetta Pean, [name] 01/10/17 [place], MD 01/11/17 [phone]

## 2017-01-10 NOTE — Progress Notes (Signed)
Pharmacy Antibiotic Note  ROYALTI SCHAUF is a 83 y.o. female admitted on 01/10/2017 with HCAP. Pt has history of dementia, COPD, and PNA who presents to the ED from her SNF via EMS with SOB  Pharmacy has been consulted for vancomycin and cefepime dosing.  Plan: Cefepime 1gm IV q24h Vancomycin 500mg  IV q24h Follow renal function, cultures and clinical course  Height: 4\' 8"  (142.2 cm) Weight: 84 lb (38.1 kg) IBW/kg (Calculated) : 36.3  Temp (24hrs), Avg:101.1 F (38.4 C), Min:101.1 F (38.4 C), Max:101.1 F (38.4 C)   Recent Labs Lab 01/10/17 1226 01/10/17 1325  WBC 11.8*  --   CREATININE 0.90  --   LATICACIDVEN  --  2.21*    Estimated Creatinine Clearance: 23.8 mL/min (by C-G formula based on SCr of 0.9 mg/dL).    Allergies  Allergen Reactions  . Zithromax [Azithromycin] Diarrhea  . Iodine Other (See Comments)    Unknown, might be rash/hives  . Ivp Dye [Iodinated Diagnostic Agents] Other (See Comments)    Unknown rash/hives maybe  . Shellfish Allergy Other (See Comments)    Unknown rash/hives, scallops     Antimicrobials this admission: 6/27 vanc >> 6/27 cefepime >>  Microbiology results: 6/27 BCx: sent  Thank you for allowing pharmacy to be a part of this patient's care.  Dolly Rias RPh 01/10/2017, 2:21 PM Pager [phone]

## 2017-01-10 NOTE — ED Provider Notes (Addendum)
Level V caveat dementia history is obtained from patient's daughter and from old records. Patient was well yesterday. This morning she was noted to be dyspneic. Patient complains of generalized weakness. On exam patient is in moderate to severe respiratory distress. Able to speak in short and sats sentences. Lungs with coarse rhonchi diffusely. Heart tachycardic regular rhythm. Skin mottled.  Had lengthy discussion with pt's daughter . Her daughter has determined that she is a DO NOT RESUSCITATE CODE STATUS.   Palliative Care consult called by me. Code sepsis called by me .Marland Kitchen BiPAP ordered.   CRITICAL CARE Performed by: Orlie Dakin Total critical care time: 30 minutes Critical care time was exclusive of separately billable procedures and treating other patients. Critical care was necessary to treat or prevent imminent or life-threatening deterioration. Critical care was time spent personally by me on the following activities: development of treatment plan with patient and/or surrogate as well as nursing, discussions with consultants, evaluation of patient's response to treatment, examination of patient, obtaining history from patient or surrogate, ordering and performing treatments and interventions, ordering and review of laboratory studies, ordering and review of radiographic studies, pulse oximetry and re-evaluation of patient's condition. Orlie Dakin, MD 01/10/17 [place], [place], MD 01/11/17 [phone]

## 2017-01-10 NOTE — ED Triage Notes (Signed)
She was sent by staff at [facility] with c/o congestion and shortness of breath. She rec'd. A Duoneb treatment at her facility p.t.a. And a 2.5mg  albuterol neb. En route to hospital. She arrives awake and confused (has dementia).

## 2017-01-11 DIAGNOSIS — Z515 Encounter for palliative care: Secondary | ICD-10-CM

## 2017-01-11 DIAGNOSIS — E440 Moderate protein-calorie malnutrition: Secondary | ICD-10-CM

## 2017-01-11 LAB — COMPREHENSIVE METABOLIC PANEL
ALK PHOS: 58 U/L (ref 38–126)
ALT: 11 U/L — ABNORMAL LOW (ref 14–54)
ANION GAP: 5 (ref 5–15)
AST: 11 U/L — ABNORMAL LOW (ref 15–41)
Albumin: 2.9 g/dL — ABNORMAL LOW (ref 3.5–5.0)
BILIRUBIN TOTAL: 0.9 mg/dL (ref 0.3–1.2)
BUN: 17 mg/dL (ref 6–20)
CALCIUM: 7.8 mg/dL — AB (ref 8.9–10.3)
CO2: 26 mmol/L (ref 22–32)
Chloride: 103 mmol/L (ref 101–111)
Creatinine, Ser: 0.68 mg/dL (ref 0.44–1.00)
GFR calc Af Amer: >60 mL/min (ref >60)
GLUCOSE: 122 mg/dL — AB (ref 65–99)
POTASSIUM: 3.7 mmol/L (ref 3.5–5.1)
Sodium: 134 mmol/L — ABNORMAL LOW (ref 135–145)
TOTAL PROTEIN: 6.5 g/dL (ref 6.5–8.1)

## 2017-01-11 LAB — GLUCOSE, CAPILLARY: GLUCOSE-CAPILLARY: 112 mg/dL — AB (ref 65–99)

## 2017-01-11 LAB — URINALYSIS, ROUTINE W REFLEX MICROSCOPIC
Bilirubin Urine: NEGATIVE
GLUCOSE, UA: NEGATIVE mg/dL
HGB URINE DIPSTICK: NEGATIVE
Ketones, ur: NEGATIVE mg/dL
LEUKOCYTES UA: NEGATIVE
Nitrite: NEGATIVE
PH: 5 (ref 5.0–8.0)
PROTEIN: NEGATIVE mg/dL
SPECIFIC GRAVITY, URINE: 1.012 (ref 1.005–1.030)

## 2017-01-11 MED ORDER — PIPERACILLIN-TAZOBACTAM 3.375 G IVPB
3.3750 g | Freq: Three times a day (TID) | INTRAVENOUS | Status: DC
  Administered 2017-01-11 – 2017-01-18 (×22): 3.375 g via INTRAVENOUS
  Filled 2017-01-11 (×23): qty 50

## 2017-01-11 MED ORDER — RASAGILINE MESYLATE 1 MG PO TABS
1.0000 mg | ORAL_TABLET | Freq: Every day | ORAL | Status: DC
  Administered 2017-01-11 – 2017-01-24 (×14): 1 mg via ORAL
  Filled 2017-01-11 (×15): qty 1

## 2017-01-11 MED ORDER — CARBIDOPA-LEVODOPA ER 48.75-195 MG PO CPCR
1.0000 | ORAL_CAPSULE | Freq: Four times a day (QID) | ORAL | Status: DC
  Administered 2017-01-11 – 2017-01-17 (×22): 1 via ORAL
  Administered 2017-01-17: 23:00:00 via ORAL
  Administered 2017-01-18: 1 via ORAL
  Administered 2017-01-18: 48.75 via ORAL
  Administered 2017-01-19 – 2017-01-25 (×20): 1 via ORAL
  Filled 2017-01-11: qty 1

## 2017-01-11 NOTE — Progress Notes (Signed)
Nutrition Follow-up  DOCUMENTATION CODES:   Non-severe (moderate) malnutrition in context of chronic illness  INTERVENTION:  - Diet advancement as medically feasible. - RD will monitor for Palliative Care note and GOC.   NUTRITION DIAGNOSIS:   Malnutrition (moderate/non-severe) related to chronic illness (COPD, Parkinson's, advanced age) as evidenced by mild depletion of body fat, mild depletion of muscle mass, moderate depletions of muscle mass.   GOAL:   Patient will meet greater than or equal to 90% of their needs  MONITOR:   Diet advancement, Weight trends, Labs, Other (Comment) (POC/GOC )  ASSESSMENT:   83 y.o. female with medical history significant of COPD/bronchiectasis, Parkinson's disease, Paget bone disease, osteoporosis, history of colon cancer was brought to the ER by the family due to concerns of abnormal breath sounds and decreased mentation.She lives at [facility] group home. At baseline she is able to recognize family members and interactive through short conversations but there are periods of sundowning. Patient has been eating small bites and meals at a time but there is still some concerns of aspiration. Patient was admitted here last year in April for left hip fracture and at that time she was evaluated by speech and swallow who recommended her to be on dysphagia 1 diet.  Pt seen for low [place]. BMI indicates normal weight. Pt on BiPAP and unable to communicate; no family/visitors present at this time. Palliative Care consulted yesterday afternoon; will monitor for GOC.   Physical assessment shows mild fat and mild/moderate muscle wasting. Mild BLE edema also noted. Per chart review, no weight since 12/02/16 and pt weighed 77 lbs at that time (currently 85 lbs). Her weight has been 76-91 lbs since 04/09/15.  Medications reviewed; 12.5 mg IV Synthroid/day. Labs reviewed; Na: 134 mmol/L, Ca: 7.8 mg/dL.  IVF: D5-1/2 NS @ 75 mL/hr (306 kcal from dextrose).   Diet  Order:  DIET - DYS 1 Room service appropriate? Yes with Assist; Fluid consistency: Nectar Thick  Skin:  Reviewed, no issues  Last BM:  6/28  Height:   Ht Readings from Last 1 Encounters:  01/10/17 4\' 9"  (1.448 m)    Weight:   Wt Readings from Last 1 Encounters:  01/10/17 85 lb 15.7 oz (39 kg)    Ideal Body Weight:  41.45 kg  BMI:  Body mass index is 18.61 kg/m.  Estimated Nutritional Needs:   Kcal:  1090-1250 (28-32 kcal/kg)  Protein:  40-50 grams  Fluid:  >/= 1.2 L/day  EDUCATION NEEDS:   No education needs identified at this time    Jarome Matin, MS, RD, LDN, CNSC Inpatient Clinical Dietitian Pager # [phone] After hours/weekend pager # [phone]

## 2017-01-11 NOTE — Progress Notes (Signed)
Pharmacy Antibiotic Note  Shannon Dunn is a 83 y.o. female  With hx dementia presented to the ED from SNF on 01/10/2017 with c/o SOB.  Vancomycin and cefepime started on admission for PNA.  With concern for aspiration, to change cefepime to zosyn on 6/28.  - Tmax 101.1, wbc 11.8, scr ok (crcl~28, low for high age and weight), LA down   Plan: - continue vancomycin 500 mg IV q24h - zosyn 3.375 gm IV q8h (infuse over 4  Hrs) - daily scr  ________________________  Height: 4\' 9"  (144.8 cm) Weight: 85 lb 15.7 oz (39 kg) IBW/kg (Calculated) : 38.6  Temp (24hrs), Avg:99.8 F (37.7 C), Min:98.1 F (36.7 C), Max:101.1 F (38.4 C)   Recent Labs Lab 01/10/17 1226 01/10/17 1325 01/10/17 1722 01/11/17 0328  WBC 11.8*  --   --   --   CREATININE 0.90  --   --  0.68  LATICACIDVEN  --  2.21* 0.7  --     Estimated Creatinine Clearance: 28.5 mL/min (by C-G formula based on SCr of 0.68 mg/dL).    Allergies  Allergen Reactions  . Zithromax [Azithromycin] Diarrhea  . Iodine Other (See Comments)    Unknown, might be rash/hives  . Ivp Dye [Iodinated Diagnostic Agents] Other (See Comments)    Unknown rash/hives maybe  . Shellfish Allergy Other (See Comments)    Unknown rash/hives, scallops     Antimicrobials this admission:  6/27 vanc>> 6/27 cefepime>>6/28 6/28 zosyn (?asp)  Dose adjustments this admission:  --  Microbiology results:  6/27 BCx x2:  6/27 MRSA PCR: neg 6/28 UA: neg   Thank you for allowing pharmacy to be a part of this patient's care.  Lynelle Doctor 01/11/2017 9:33 AM

## 2017-01-11 NOTE — Clinical Social Work Note (Signed)
Clinical Social Work Assessment  Patient Details  Name: Shannon Dunn MRN: [id number] Date of Birth: 1924-01-13  Date of referral:  01/11/17               Reason for consult:  Facility Placement                Permission sought to share information with:  Family Supports, Chartered certified accountant granted to share information::  Yes, Verbal Permission Granted  Name::        Agency::     Relationship::     Contact Information:     Housing/Transportation Living arrangements for the past 2 months:  [place] of Information:  Spouse Patient Interpreter Needed:  None Criminal Activity/Legal Involvement Pertinent to Current Situation/Hospitalization:    Significant Relationships:  Adult Children, Spouse Lives with:  Facility Resident Do you feel safe going back to the place where you live?  Yes Need for family participation in patient care:  Yes (Comment)  Care giving concerns:  None listed by pt/family   Social Worker assessment / plan:  CSW spoke to pt's husband and son and confirmed pt's plan to be discharged back to [facility] to live at discharge.  CSW provided active listening and validated pt's concerns.   Pt's husband gave permission for the  CSW Dept to complete FL-2 and/or any other necessary documentation and send referrals out to SNF facilities via the hub per pt's request.  Pt has been living at [facility] for 3 years and pt's husband lives at [facility] independent living facility prior to the pt being admitted to [facility].  Employment status:  Retired Nurse, adult PT Recommendations:  Not assessed at this time Information / Referral to community resources:     Patient/Family's Response to care:  Patient not alert and oriented.  Patient's spouse agreeable to plan.  Pt's spouse supportive and strongly involved in pt.'s care.  Pt.'s spouse pleasant and  appreciated CSW intervention.   Patient/Family's Understanding of and Emotional Response to Diagnosis, Current Treatment, and Prognosis:  Still assessing  Emotional Assessment Appearance:    Attitude/Demeanor/Rapport:    Affect (typically observed):    Orientation:   (Pt has dementia) Alcohol / Substance use:    Psych involvement (Current and /or in the community):     Discharge Needs  Concerns to be addressed:    Readmission within the last 30 days:  No Current discharge risk:  None Barriers to Discharge:  No Barriers Identified   Claudine Mouton, LCSWA 01/11/2017, 8:07 PM

## 2017-01-11 NOTE — Progress Notes (Signed)
Removed PT from BiPAP for mask change due to soiled. Once removed PT engaged in conversation with Sp02 never lower than 93% on RA. Did place PT on 3lpm [place]- uses 02 prn at home. PT does not appear to be in respiratory distress at this time. Family at bedside and wished to leave PT off BiPAP for now- husband is coming (here at hospital now- is their anniversary today).

## 2017-01-11 NOTE — Consult Note (Signed)
Consultation Note Date: 01/11/2017   Patient Name: Shannon Dunn  DOB: 10/14/1923  MRN: [id number]  Age / Sex: 83 y.o., female  PCP: Renata Caprice, DO Referring Physician: Patrecia Pour, MD  Reason for Consultation: Establishing goals of care  HPI/Patient Profile: 83 y.o. female   admitted on 01/10/2017    Clinical Assessment and Goals of Care: Shannon Dunn is a 83 y.o. female with medical history significant of COPD/bronchiectasis, Parkinson's disease, Paget bone disease, osteoporosis, history of colon cancer was brought to the ER by the family due to concerns of abnormal breath sounds and decreased mentation. She is admitted to the hospitalist service for sepsis secondary to HCAP, acute metabolic encephalopathy, COPD. She has parkinson's, she is BIPAP dependent, she is not able to take any PO medications or food.   A palliative consult has been requested for additional goals of care discussions. Shannon Dunn is resting in bed, she is on the BIPAP, does not awaken, does not follow commands, I met with her daughter Shannon Dunn by the bedside, I introduced myself and palliative care as follows: Palliative medicine is specialized medical care for people living with serious illness. It focuses on providing relief from the symptoms and stress of a serious illness. The goal is to improve quality of life for both the patient and the family.  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 She has been married for 39 years, she has 5 kids. Shannon Dunn states that her mother has had a long satisfying life, she  has made her wishes known about not ending up on tubes and machines at end of life, she has completed her funeral arrangements, advanced directives and all important paperwork.   See recommendations and discussion below.  NEXT OF KIN  daughter Shannon Dunn, she has a husband, 5 kids in total.   SUMMARY OF RECOMMENDATIONS    DNR DNI re confirmed with daughter Shannon Dunn at the bedside Continue current course Daughter is hopeful for stabilization/recovery, wants patient to go back to [facility] memory care unit on discharge.  I have briefly discussed with daughter about comfort care/hospice level of care should the patient continue to decline or should this prove to be her life limiting event. Daughter is appreciative of the information provided, we will remain in touch.  Thank you for the consult.   Code Status/Advance Care Planning:  DNR    Symptom Management:      Palliative Prophylaxis:   Delirium Protocol  Psycho-social/Spiritual:   Desire for further Chaplaincy support:no  Additional Recommendations: Education on Hospice  Prognosis:   Guarded   Discharge Planning: To Be Determined  Primary Diagnoses: Present on Admission: . HCAP (healthcare-associated pneumonia)   I have reviewed the medical record, interviewed the patient and family, and examined the patient. The following aspects are pertinent.  Past Medical History:  Diagnosis Date  . Colon cancer ([place])   . COPD (chronic obstructive pulmonary disease) ([place])   . Dementia   . Osteoporosis   . Paget's bone disease   . Pagets disease, breast ([place])   . Parkinson's disease ([place])   . Pernicious anemia   . Renal disorder    Renal insufficiency  . Ventral hernia   . Vitamin D deficiency    Social History   Social History  . Marital status: Married    Spouse name: N/A  . Number of children: N/A  . Years of education: N/A   Occupational History  . Retired     [place]   Social History Main Topics    . Smoking status: Never Smoker  . Smokeless tobacco: Never Used  . Alcohol use Yes     Comment: glass of wine maybe once a week  . Drug use: No  . Sexual activity: Not Currently   Other Topics Concern  . None   Social History Narrative   Patient currently lives at a local skilled nursing facility in a dementia care unit. Husband lives with in same assisted-living community.   Family History  Problem Relation Age of Onset  . Heart disease Mother   . Alzheimer's disease Father    Scheduled Meds: . budesonide  0.25 mg Nebulization QPC breakfast  . Carbidopa-Levodopa ER  1 capsule Oral QID  . levothyroxine  12.5 mcg Intravenous Daily  . rasagiline  1 mg Oral Q1400  . rivastigmine  13.3 mg Transdermal Daily  . rotigotine  1 patch Transdermal Daily   Continuous Infusions: . dextrose 5 % and 0.45% NaCl 75 mL/hr at 01/11/17 1000  . piperacillin-tazobactam (ZOSYN)  IV 3.375 g (01/11/17 1005)  . vancomycin Stopped (01/10/17 1602)   PRN Meds:.albuterol Medications Prior to Admission:  Prior to Admission medications   Medication Sig Start Date End Date Taking? Authorizing Provider  albuterol (PROVENTIL) (2.5 MG/3ML) 0.083% nebulizer solution Take 3 mLs (2.5 mg total) by nebulization every 6 (six) hours as needed for wheezing or shortness of breath. Patient taking differently: Take 2.5 mg by nebulization 2 (two) times daily.  03/09/16  Yes Tanda Rockers, MD  budesonide (PULMICORT) 0.25 MG/2ML nebulizer solution Take 0.25 mg by nebulization daily after breakfast.    Yes [provider]  Calcium Citrate-Vitamin D 200-250 MG-UNIT TABS Take 3 tablets by mouth 2 (two) times daily.   Yes [provider]  Carbidopa-Levodopa ER (RYTARY) 48.75-195 MG CPCR Take 1 capsule by mouth 4 (four) times daily. Takes 1 capsule by mouth 4 times a day at 0000, 0800, 1400 and 1800   Yes [provider]  cholecalciferol (VITAMIN D) 1000 units tablet Take 3,000 Units by mouth daily  with breakfast.    Yes [provider]  ENSURE (ENSURE) Take 237 mLs by mouth 2 (two) times daily between meals. *Vanilla*   Yes [provider]  fluticasone (FLONASE) 50 MCG/ACT nasal spray Place 1 spray into both nostrils daily after breakfast.    Yes [provider]  GuaiFENesin (DIABETIC TUSSIN EX PO) Take 5 mLs by mouth every 6 (six) hours as needed (congestion/cough.).   Yes [provider]  levothyroxine (SYNTHROID, LEVOTHROID) 25 MCG tablet Take 1 tablet (25 mcg total) by mouth daily before  breakfast. 11/08/15  Yes Robbie Lis, MD  loperamide (IMODIUM) 2 MG capsule Take 2 mg by mouth daily as needed for diarrhea or loose stools.   Yes [provider]  Probiotic Product (ACIDOPHILUS PROBIOTIC BLEND) CAPS Take 1 capsule by mouth daily with breakfast.   Yes [provider]  ranitidine (ZANTAC) 75 MG tablet Take 75 mg by mouth 2 (two) times daily.   Yes [provider]  rasagiline (AZILECT) 1 MG TABS tablet Take 1 mg by mouth daily at 2 PM.    Yes [provider]  Rivastigmine (EXELON) 13.3 MG/24HR PT24 Place 1 patch onto the skin daily. Rotate sites   Yes [provider]  rotigotine (NEUPRO) 4 MG/24HR Place 1 patch onto the skin daily. Remove old patch before applying new one.   Yes [provider]  senna-docusate (SENOKOT-S) 8.6-50 MG tablet Take 1 tablet by mouth at bedtime as needed for mild constipation. 11/08/15  Yes Robbie Lis, MD  vitamin B-12 (CYANOCOBALAMIN) 500 MCG tablet Take 500 mcg by mouth daily with breakfast.    Yes [provider]  Wheat Dextrin (BENEFIBER PO) Take 1 scoop by mouth daily with breakfast. Mix iIn 120 ml of fluid   Yes [provider]   Allergies  Allergen Reactions  . Zithromax [Azithromycin] Diarrhea  . Iodine Other (See Comments)    Unknown, might be rash/hives  . Ivp Dye [Iodinated Diagnostic Agents] Other (See Comments)    Unknown rash/hives  maybe  . Shellfish Allergy Other (See Comments)    Unknown rash/hives, scallops    Review of Systems Non verbal Physical Exam Weak elderly lady On the BIPAP Not awake not alert Clear breath sounds S1 S2 Abdomen soft Trace edema  Vital Signs: BP 139/84   Pulse 100   Temp 99.6 F (37.6 C) (Axillary)   Resp 19   Ht _0  (1.448 m)   Wt 39 kg (85 lb 15.7 oz)   SpO2 98%   BMI 18.61 kg/m  Pain Assessment: CPOT   Pain Score: Asleep   SpO2: SpO2: 98 % O2 Device:SpO2: 98 % O2 Flow Rate: .O2 Flow Rate (L/min): 4 L/min (decreased to 3 lpm )  IO: Intake/output summary:  Intake/Output Summary (Last 24 hours) at 01/11/17 1258 Last data filed at 01/11/17 1000  Gross per 24 hour  Intake          1243.75 ml  Output              600 ml  Net           643.75 ml    LBM: Last BM Date: 01/11/17 Baseline Weight: Weight: 38.1 kg (84 lb) Most recent weight: Weight: 39 kg (85 lb 15.7 oz)     Palliative Assessment/Data:   Flowsheet Rows     Most Recent Value  Intake Tab  Referral Department  Hospitalist  Unit at Time of Referral  Intermediate Care Unit  Palliative Care Primary Diagnosis  Sepsis/Infectious Disease  Date Notified  01/10/17  Palliative Care Type  New Palliative care  Reason for referral  Clarify Goals of Care  Date of Admission  01/10/17  Date first seen by Palliative Care  01/11/17  # of days IP prior to Palliative referral  0  Clinical Assessment  Palliative Performance Scale Score  10%  Pain Max last 24 hours  4  Pain Min Last 24 hours  3  Dyspnea Max Last 24 Hours  6  Dyspnea Min Last  24 hours  5  Psychosocial & Spiritual Assessment  Palliative Care Outcomes  Patient/Family meeting held?  Yes  Who was at the meeting?  patient's daughter Shannon Dunn   Palliative Care Outcomes  Clarified goals of care      Time In:  12  Time Out:  1300 Time Total:  60 min  Greater than 50%  of this time was spent counseling and coordinating care related to the above  assessment and plan.  Signed by: Loistine Chance, MD  [phone]  Please contact Palliative Medicine Team phone at [phone] for questions and concerns.  For individual provider: See Shea Evans

## 2017-01-11 NOTE — Progress Notes (Signed)
PROGRESS NOTE  TEXAS OBORN  DSK:[id number] DOB: [birth year] DOA: 01/10/2017 PCP: Renata Caprice, DO   Brief Narrative:  Shannon Dunn is a 83 y.o. female with medical history significant of COPD/bronchiectasis, Parkinson's disease, Paget bone disease, osteoporosis, history of colon cancer who was brought to the ER from Memory Care Unit by the family due to concerns of abnormal breath sounds and decreased mentation. History is per family due to patient's mental status.  Family reports a cough productive of green sputum for 2 weeks and subjective fever. At baseline she is able to recognize family members and interactive through short conversations but there are periods of sundowning, but developed drowsiness, prompting presentation to ED. In the ER patient was noted to be febrile with temperature of 101F. lactate of 2.2, elevated heart rate, tachypnea and signs of LLL pneumonia on the chest x-ray. She was placed on BiPAP for resp support with improvement in ABG, and started on vancomycin and cefepime. She continued on BiPAP overnight and DNR status has been confirmed by palliative care consultant.   Assessment & Plan: Active Problems:   HCAP (healthcare-associated pneumonia)   Malnutrition of moderate degree  Acute hypoxemic respiratory failure and sepsis secondary to LLL HCAP: Lactate improved, vital signs improved.  - Still required BiPAP as of this AM, wean as tolerated. Confirmed DNR. Appreciate palliative care assistance with goals of care. - MRSA PCR neg, so unlikely MRSA. Until stable off BiPAP, would prefer to continue this. Monitor renal function.  - Change cefepime to zosyn to cover anaerobes/aspiration.  - Consider repeat CXR.  Acute metabolic encephalopathy: Due to sepsis - Monitor  COPD/bronchiectasis - Continue BiPAP and supplemental oxygen as needed - Use inhaler/nebulizer as needed  Parkinson disease - Concern for withdrawal if not able to administer medication. Will  order and give as able.   Hypothyroidism - Continue synthroid.  History of dementia:  - Family hopeful for return to memory care unit.   History of osteoporosis on vitamin D supplements - Stable  DVT prophylaxis: Heparin Code Status: DNR confirmed Family Communication: Daughter at bedside Disposition Plan: Continue SDU level of care  Consultants:   Palliative care medicine  Procedures:   BiPAP 6/27 - 6/28  Antimicrobials:  Vancomycin 6/27 >>  Cefepime 6/27 - 6/28  Zosyn 6/28 >>    Subjective: Pt does not follow commands, nonverbal, does not wake up.   Objective: Vitals:   01/11/17 0900 01/11/17 1000 01/11/17 1100 01/11/17 1243  BP: (!) 130/57 (!) 138/56 139/84   Pulse:      Resp: 18 18 19    Temp:      TempSrc:      SpO2: 100% 96% 99% 98%  Weight:      Height:        Intake/Output Summary (Last 24 hours) at 01/11/17 1354 Last data filed at 01/11/17 1000  Gross per 24 hour  Intake          1243.75 ml  Output              600 ml  Net           643.75 ml   Filed Weights   01/10/17 1301 01/10/17 1703  Weight: 38.1 kg (84 lb) 39 kg (85 lb 15.7 oz)    Examination: General exam: Elderly female resting on BiPAP Respiratory system: On BiPAP, no accessory muscle use. Decreased breath sounds at left base, no wheezes or crackles.  Cardiovascular system: Regular rate and rhythm. No JVD or  edema Gastrointestinal system: Abdomen soft, non-tender, non-distended, with normoactive bowel sounds. No organomegaly or masses felt. Central nervous system: Weakly attempts to open eyes to voice, will speak one-word at a time to daughter earlier this morning. No focal deficits on limited exam. Extremities: Warm, no deformities Skin: No rashes Psychiatry: UTD  Data Reviewed: I have personally reviewed following labs and imaging studies  CBC:  Recent Labs Lab 01/10/17 1226  WBC 11.8*  NEUTROABS 10.0*  HGB 11.1*  HCT 34.0*  MCV 90.9  PLT 734   Basic Metabolic  Panel:  Recent Labs Lab 01/10/17 1226 01/11/17 0328  NA 134* 134*  K 4.7 3.7  CL 97* 103  CO2 26 26  GLUCOSE 156* 122*  BUN 21* 17  CREATININE 0.90 0.68  CALCIUM 9.0 7.8*   GFR: Estimated Creatinine Clearance: 28.5 mL/min (by C-G formula based on SCr of 0.68 mg/dL). Liver Function Tests:  Recent Labs Lab 01/11/17 0328  AST 11*  ALT 11*  ALKPHOS 58  BILITOT 0.9  PROT 6.5  ALBUMIN 2.9*   No results for input(s): LIPASE, AMYLASE in the last 168 hours. No results for input(s): AMMONIA in the last 168 hours. Coagulation Profile: No results for input(s): INR, PROTIME in the last 168 hours. Cardiac Enzymes: No results for input(s): CKTOTAL, CKMB, CKMBINDEX, TROPONINI in the last 168 hours. BNP (last 3 results) No results for input(s): PROBNP in the last 8760 hours. HbA1C: No results for input(s): HGBA1C in the last 72 hours. CBG:  Recent Labs Lab 01/11/17 0543  GLUCAP 112*   Lipid Profile: No results for input(s): CHOL, HDL, LDLCALC, TRIG, CHOLHDL, LDLDIRECT in the last 72 hours. Thyroid Function Tests: No results for input(s): TSH, T4TOTAL, FREET4, T3FREE, THYROIDAB in the last 72 hours. Anemia Panel: No results for input(s): VITAMINB12, FOLATE, FERRITIN, TIBC, IRON, RETICCTPCT in the last 72 hours. Urine analysis:    Component Value Date/Time   COLORURINE YELLOW 01/11/2017 [place] 01/11/2017 0643   LABSPEC 1.012 01/11/2017 0643   PHURINE 5.0 01/11/2017 0643   GLUCOSEU NEGATIVE 01/11/2017 0643   HGBUR NEGATIVE 01/11/2017 0643   BILIRUBINUR NEGATIVE 01/11/2017 0643   KETONESUR NEGATIVE 01/11/2017 0643   PROTEINUR NEGATIVE 01/11/2017 0643   UROBILINOGEN 0.2 04/09/2015 1355   NITRITE NEGATIVE 01/11/2017 0643   LEUKOCYTESUR NEGATIVE 01/11/2017 0643   Recent Results (from the past 240 hour(s))  MRSA PCR Screening     Status: None   Collection Time: 01/10/17  8:00 PM  Result Value Ref Range Status   MRSA by PCR NEGATIVE NEGATIVE Final     Comment:        The GeneXpert MRSA Assay (FDA approved for NASAL specimens only), is one component of a comprehensive MRSA colonization surveillance program. It is not intended to diagnose MRSA infection nor to guide or monitor treatment for MRSA infections.       Radiology Studies: Dg Chest Port 1 View  Result Date: 01/10/2017 CLINICAL DATA:  Shortness of breath.  Cough and congestion. EXAM: PORTABLE CHEST 1 VIEW COMPARISON:  11/08/2015 .  07/30/2015 . FINDINGS: Heart size normal. Left base infiltrate with left-sided pleural effusion. Component of scarring may be present. Interstitial changes are again noted most likely chronic. No acute bony abnormality. Degenerative changes noted of the thoracic spine and both shoulders. IMPRESSION: 1. Left lower lobe infiltrate with left-sided pleural effusion. Component these changes may be related scarring. 2. Chronic interstitial lung disease . Electronically Signed   By: Marcello Moores  Register  On: 01/10/2017 12:58    Scheduled Meds: . budesonide  0.25 mg Nebulization QPC breakfast  . Carbidopa-Levodopa ER  1 capsule Oral QID  . levothyroxine  12.5 mcg Intravenous Daily  . rasagiline  1 mg Oral Q1400  . rivastigmine  13.3 mg Transdermal Daily  . rotigotine  1 patch Transdermal Daily   Continuous Infusions: . dextrose 5 % and 0.45% NaCl 75 mL/hr at 01/11/17 1000  . piperacillin-tazobactam (ZOSYN)  IV Stopped (01/11/17 1405)  . vancomycin Stopped (01/10/17 1602)     LOS: 1 day   Time spent: 35 minutes.  Vance Gather, MD [facility] Pager [phone]  If 7PM-7AM, please contact night-coverage [email or web address] Password [email or web address] 01/11/2017, 1:54 PM

## 2017-01-12 LAB — BASIC METABOLIC PANEL
Anion gap: 7 (ref 5–15)
BUN: 12 mg/dL (ref 6–20)
CALCIUM: 7.4 mg/dL — AB (ref 8.9–10.3)
CHLORIDE: 107 mmol/L (ref 101–111)
CO2: 25 mmol/L (ref 22–32)
CREATININE: 0.72 mg/dL (ref 0.44–1.00)
GFR calc Af Amer: >60 mL/min (ref >60)
GFR calc non Af Amer: >60 mL/min (ref >60)
GLUCOSE: 95 mg/dL (ref 65–99)
Potassium: 3.6 mmol/L (ref 3.5–5.1)
Sodium: 139 mmol/L (ref 135–145)

## 2017-01-12 LAB — CBC
HEMATOCRIT: 27.9 % — AB (ref 36.0–46.0)
HEMOGLOBIN: 8.9 g/dL — AB (ref 12.0–15.0)
MCH: 28.9 pg (ref 26.0–34.0)
MCHC: 31.9 g/dL (ref 30.0–36.0)
MCV: 90.6 fL (ref 78.0–100.0)
Platelets: 285 10*3/uL (ref 150–400)
RBC: 3.08 MIL/uL — ABNORMAL LOW (ref 3.87–5.11)
RDW: 16.4 % — AB (ref 11.5–15.5)
WBC: 10.7 10*3/uL — ABNORMAL HIGH (ref 4.0–10.5)

## 2017-01-12 LAB — GLUCOSE, CAPILLARY: Glucose-Capillary: 97 mg/dL (ref 65–99)

## 2017-01-12 MED ORDER — DEXTROSE-NACL 5-0.9 % IV SOLN
INTRAVENOUS | Status: DC
  Administered 2017-01-12: 11:00:00 via INTRAVENOUS

## 2017-01-12 MED ORDER — SODIUM CHLORIDE 0.9 % IV BOLUS (SEPSIS)
250.0000 mL | Freq: Once | INTRAVENOUS | Status: AC
  Administered 2017-01-12: 250 mL via INTRAVENOUS

## 2017-01-12 MED ORDER — VANCOMYCIN HCL 500 MG IV SOLR
500.0000 mg | INTRAVENOUS | Status: AC
  Administered 2017-01-12: 500 mg via INTRAVENOUS
  Filled 2017-01-12: qty 500

## 2017-01-12 MED ORDER — RESOURCE THICKENUP CLEAR PO POWD
ORAL | Status: DC | PRN
  Filled 2017-01-12: qty 125

## 2017-01-12 MED ORDER — ACETAMINOPHEN 650 MG RE SUPP
650.0000 mg | RECTAL | Status: DC | PRN
  Administered 2017-01-12: 650 mg via RECTAL
  Filled 2017-01-12: qty 1

## 2017-01-12 NOTE — Progress Notes (Signed)
CSW following to assist with d/c planning. Clinicals provided to [facility] for review. Call received from facility that they are only able to provide a regular diet at d/c. Pt is on a DYS1 diet with SLP consulted. Pt / family would like pt to return to [facility] at d/c. Facility would like pt to return provided they can meet her needs. CSW will continue to follow to assist with d/c planning.  Werner Lean LCSW [phone]

## 2017-01-12 NOTE — Progress Notes (Signed)
PROGRESS NOTE  Shannon Dunn  YYT:[id number] DOB: 1924/05/05 DOA: 01/10/2017 PCP: Renata Caprice, DO   Brief Narrative:  Shannon Dunn is a 83 y.o. female with medical history significant of COPD/bronchiectasis, Parkinson's disease, Paget bone disease, osteoporosis, history of colon cancer who was brought to the ER from Memory Care Unit by the family due to concerns of abnormal breath sounds and decreased mentation. History is per family due to patient's mental status.  Family reports a cough productive of green sputum for 2 weeks and subjective fever. At baseline she is able to recognize family members and interactive through short conversations but there are periods of sundowning, but developed drowsiness, prompting presentation to ED. In the ER patient was noted to be febrile with temperature of 101F. lactate of 2.2, elevated heart rate, tachypnea and signs of LLL pneumonia on the chest x-ray. She was placed on BiPAP for resp support with improvement in ABG, and started on vancomycin and cefepime. She continued on BiPAP overnight and weaned to nasal cannula 6/28. DNR status has been confirmed by palliative care consultant and the patient's mental status has improved.  Assessment & Plan: Active Problems:   HCAP (healthcare-associated pneumonia)   Malnutrition of moderate degree  Acute hypoxemic respiratory failure and sepsis secondary to LLL HCAP: Lactate improved, vital signs improved. - Will start IVF's pending improvement in per oral intake and lower trending BP's.  - Off BiPAP x24 hrs. Nonlabored, normal rate on nasal cannula. Will transfer to telemetry.  - MRSA PCR neg, so unlikely MRSA. Consider DC vanc 6/30.  - Changed cefepime to zosyn to cover anaerobes/aspiration 6/29. - SLP evaluation.  - Confirmed DNR. Appreciate palliative care assistance with goals of care.  Acute metabolic encephalopathy: Due to sepsis - Monitor  COPD/bronchiectasis - Continue supplemental oxygen as  needed - Use inhaler/nebulizer as needed  Parkinson disease - Home medications ordered. - SLP evaluation as above  Hypothyroidism:  - Continue synthroid. - Check TSH.   History of dementia:  - Family hopeful for return to memory care unit. Will monitor progress.  - Continue exelon  History of osteoporosis on vitamin D supplements - Stable  DVT prophylaxis: Heparin Code Status: DNR confirmed Family Communication: Daughter at bedside Disposition Plan: Plan to transfer to telemetry since off BiPAP x24 hrs, overall improving.   Consultants:   Palliative care medicine  Procedures:   BiPAP 6/27 - 6/28  Antimicrobials:  Vancomycin 6/27 >>  Cefepime 6/27 - 6/28  Zosyn 6/28 >>    Subjective: Pt sleeping soundly, was facetiming with family, alert and active. Had some sundowning/delirium last night as she usually does per daughter. Breathing well, she's not had complaints of pain.   Objective: Vitals:   01/12/17 0750 01/12/17 0800 01/12/17 0813 01/12/17 0900  BP:  (!) 114/48  (!) 118/46  Pulse:      Resp:  19  19  Temp: 99.1 F (37.3 C)     TempSrc: Axillary     SpO2:  96% 97% 97%  Weight:      Height:        Intake/Output Summary (Last 24 hours) at 01/12/17 0935 Last data filed at 01/12/17 0600  Gross per 24 hour  Intake             1200 ml  Output              800 ml  Net              400 ml  Filed Weights   01/10/17 1301 01/10/17 1703 01/12/17 0252  Weight: 38.1 kg (84 lb) 39 kg (85 lb 15.7 oz) 40.1 kg (88 lb 6.5 oz)    Examination: General exam: Elderly female resting quietly Respiratory system: Nonlabored, normal rate on 4L by [place]. Decreased breath sounds at left base, no wheezes or crackles.  Cardiovascular system: Regular rate and rhythm. No JVD or edema Gastrointestinal system: Abdomen soft, non-tender, non-distended, with normoactive bowel sounds. No organomegaly or masses felt. Central nervous system: Weakly attempts to open eyes to voice,  will speak one-word at a time to daughter earlier this morning. No focal deficits on limited exam. Extremities: Warm, no deformities Skin: No rashes Psychiatry: UTD  Data Reviewed: I have personally reviewed following labs and imaging studies  CBC:  Recent Labs Lab 01/10/17 1226 01/12/17 0315  WBC 11.8* 10.7*  NEUTROABS 10.0*  --   HGB 11.1* 8.9*  HCT 34.0* 27.9*  MCV 90.9 90.6  PLT 369 850   Basic Metabolic Panel:  Recent Labs Lab 01/10/17 1226 01/11/17 0328 01/12/17 0315  NA 134* 134* 139  K 4.7 3.7 3.6  CL 97* 103 107  CO2 26 26 25   GLUCOSE 156* 122* 95  BUN 21* 17 12  CREATININE 0.90 0.68 0.72  CALCIUM 9.0 7.8* 7.4*   GFR: Estimated Creatinine Clearance: 28.5 mL/min (by C-G formula based on SCr of 0.72 mg/dL). Liver Function Tests:  Recent Labs Lab 01/11/17 0328  AST 11*  ALT 11*  ALKPHOS 58  BILITOT 0.9  PROT 6.5  ALBUMIN 2.9*   No results for input(s): LIPASE, AMYLASE in the last 168 hours. No results for input(s): AMMONIA in the last 168 hours. Coagulation Profile: No results for input(s): INR, PROTIME in the last 168 hours. Cardiac Enzymes: No results for input(s): CKTOTAL, CKMB, CKMBINDEX, TROPONINI in the last 168 hours. BNP (last 3 results) No results for input(s): PROBNP in the last 8760 hours. HbA1C: No results for input(s): HGBA1C in the last 72 hours. CBG:  Recent Labs Lab 01/11/17 0543 01/12/17 0611  GLUCAP 112* 97   Lipid Profile: No results for input(s): CHOL, HDL, LDLCALC, TRIG, CHOLHDL, LDLDIRECT in the last 72 hours. Thyroid Function Tests: No results for input(s): TSH, T4TOTAL, FREET4, T3FREE, THYROIDAB in the last 72 hours. Anemia Panel: No results for input(s): VITAMINB12, FOLATE, FERRITIN, TIBC, IRON, RETICCTPCT in the last 72 hours. Urine analysis:    Component Value Date/Time   COLORURINE YELLOW 01/11/2017 [place] 01/11/2017 0643   LABSPEC 1.012 01/11/2017 0643   PHURINE 5.0 01/11/2017 0643    GLUCOSEU NEGATIVE 01/11/2017 0643   HGBUR NEGATIVE 01/11/2017 0643   BILIRUBINUR NEGATIVE 01/11/2017 0643   KETONESUR NEGATIVE 01/11/2017 0643   PROTEINUR NEGATIVE 01/11/2017 0643   UROBILINOGEN 0.2 04/09/2015 1355   NITRITE NEGATIVE 01/11/2017 0643   LEUKOCYTESUR NEGATIVE 01/11/2017 0643   Recent Results (from the past 240 hour(s))  Culture, blood (routine x 2)     Status: None (Preliminary result)   Collection Time: 01/10/17  1:17 PM  Result Value Ref Range Status   Specimen Description BLOOD RIGHT ANTECUBITAL  Final   Special Requests   Final    BOTTLES DRAWN AEROBIC AND ANAEROBIC Blood Culture adequate volume   Culture   Final    NO GROWTH < 24 HOURS Performed at [facility], [place] [address], [place], [place] [address]    Report Status PENDING  Incomplete  Culture, blood (routine x 2)  Status: None (Preliminary result)   Collection Time: 01/10/17  5:22 PM  Result Value Ref Range Status   Specimen Description BLOOD LEFT ARM  Final   Special Requests   Final    BOTTLES DRAWN AEROBIC AND ANAEROBIC Blood Culture adequate volume   Culture   Final    NO GROWTH < 24 HOURS Performed at [facility], [address] [place], [place] [address]    Report Status PENDING  Incomplete  MRSA PCR Screening     Status: None   Collection Time: 01/10/17  8:00 PM  Result Value Ref Range Status   MRSA by PCR NEGATIVE NEGATIVE Final    Comment:        The GeneXpert MRSA Assay (FDA approved for NASAL specimens only), is one component of a comprehensive MRSA colonization surveillance program. It is not intended to diagnose MRSA infection nor to guide or monitor treatment for MRSA infections.       Radiology Studies: Dg Chest Port 1 View  Result Date: 01/10/2017 CLINICAL DATA:  Shortness of breath.  Cough and congestion. EXAM: PORTABLE CHEST 1 VIEW COMPARISON:  11/08/2015 .  07/30/2015 . FINDINGS: Heart size normal. Left base infiltrate with left-sided pleural effusion.  Component of scarring may be present. Interstitial changes are again noted most likely chronic. No acute bony abnormality. Degenerative changes noted of the thoracic spine and both shoulders. IMPRESSION: 1. Left lower lobe infiltrate with left-sided pleural effusion. Component these changes may be related scarring. 2. Chronic interstitial lung disease . Electronically Signed   By: Marcello Moores  Register   On: 01/10/2017 12:58    Scheduled Meds: . budesonide  0.25 mg Nebulization QPC breakfast  . Carbidopa-Levodopa ER  1 capsule Oral QID  . levothyroxine  12.5 mcg Intravenous Daily  . rasagiline  1 mg Oral Q1400  . rivastigmine  13.3 mg Transdermal Daily  . rotigotine  1 patch Transdermal Daily   Continuous Infusions: . piperacillin-tazobactam (ZOSYN)  IV Stopped (01/12/17 0600)  . vancomycin Stopped (01/11/17 1506)     LOS: 2 days   Time spent: 25 minutes.  Vance Gather, MD [facility] Pager [phone]  If 7PM-7AM, please contact night-coverage [email or web address] Password [facility] 01/12/2017, 9:35 AM

## 2017-01-12 NOTE — Progress Notes (Signed)
Pt. seen for respiratory assessment/BiPAP PRN order sleeping in no apparent distress, V-60 BiPAP remains in room for PRN use, RT to monitor.

## 2017-01-13 ENCOUNTER — Inpatient Hospital Stay (HOSPITAL_COMMUNITY): Payer: Medicare Other

## 2017-01-13 DIAGNOSIS — J181 Lobar pneumonia, unspecified organism: Secondary | ICD-10-CM

## 2017-01-13 DIAGNOSIS — G309 Alzheimer's disease, unspecified: Secondary | ICD-10-CM

## 2017-01-13 LAB — CBC
HCT: 29.6 % — ABNORMAL LOW (ref 36.0–46.0)
HEMOGLOBIN: 9.4 g/dL — AB (ref 12.0–15.0)
MCH: 29.5 pg (ref 26.0–34.0)
MCHC: 31.8 g/dL (ref 30.0–36.0)
MCV: 92.8 fL (ref 78.0–100.0)
PLATELETS: 320 10*3/uL (ref 150–400)
RBC: 3.19 MIL/uL — AB (ref 3.87–5.11)
RDW: 16.3 % — ABNORMAL HIGH (ref 11.5–15.5)
WBC: 10.7 10*3/uL — AB (ref 4.0–10.5)

## 2017-01-13 LAB — BLOOD GAS, ARTERIAL
Acid-Base Excess: 2.5 mmol/L — ABNORMAL HIGH (ref 0.0–2.0)
Bicarbonate: 26.9 mmol/L (ref 20.0–28.0)
DRAWN BY: 295031
O2 Content: 1 L/min
O2 Saturation: 85.8 %
PH ART: 7.412 (ref 7.350–7.450)
Patient temperature: 98.6
pCO2 arterial: 43 mmHg (ref 32.0–48.0)
pO2, Arterial: 53.4 mmHg — ABNORMAL LOW (ref 83.0–108.0)

## 2017-01-13 LAB — TSH: TSH: 1.325 u[IU]/mL (ref 0.350–4.500)

## 2017-01-13 LAB — BASIC METABOLIC PANEL
Anion gap: 6 (ref 5–15)
BUN: 10 mg/dL (ref 6–20)
CO2: 28 mmol/L (ref 22–32)
Calcium: 7.4 mg/dL — ABNORMAL LOW (ref 8.9–10.3)
Chloride: 109 mmol/L (ref 101–111)
Creatinine, Ser: 0.72 mg/dL (ref 0.44–1.00)
GFR calc Af Amer: >60 mL/min (ref >60)
GLUCOSE: 122 mg/dL — AB (ref 65–99)
POTASSIUM: 3.2 mmol/L — AB (ref 3.5–5.1)
Sodium: 143 mmol/L (ref 135–145)

## 2017-01-13 LAB — GLUCOSE, CAPILLARY: Glucose-Capillary: 130 mg/dL — ABNORMAL HIGH (ref 65–99)

## 2017-01-13 LAB — EXPECTORATED SPUTUM ASSESSMENT W REFEX TO RESP CULTURE

## 2017-01-13 LAB — EXPECTORATED SPUTUM ASSESSMENT W GRAM STAIN, RFLX TO RESP C

## 2017-01-13 MED ORDER — LEVOTHYROXINE SODIUM 25 MCG PO TABS
25.0000 ug | ORAL_TABLET | Freq: Every day | ORAL | Status: DC
  Administered 2017-01-14 – 2017-01-24 (×9): 25 ug via ORAL
  Filled 2017-01-13 (×11): qty 1

## 2017-01-13 MED ORDER — PREDNISONE 20 MG PO TABS
20.0000 mg | ORAL_TABLET | Freq: Every day | ORAL | Status: AC
  Administered 2017-01-13 – 2017-01-16 (×4): 20 mg via ORAL
  Filled 2017-01-13 (×4): qty 1

## 2017-01-13 MED ORDER — VANCOMYCIN HCL 500 MG IV SOLR
500.0000 mg | INTRAVENOUS | Status: DC
  Filled 2017-01-13: qty 500

## 2017-01-13 MED ORDER — VANCOMYCIN HCL 500 MG IV SOLR
500.0000 mg | INTRAVENOUS | Status: AC
  Administered 2017-01-13: 500 mg via INTRAVENOUS
  Filled 2017-01-13: qty 500

## 2017-01-13 MED ORDER — POTASSIUM CHLORIDE CRYS ER 20 MEQ PO TBCR
40.0000 meq | EXTENDED_RELEASE_TABLET | Freq: Once | ORAL | Status: AC
Start: 2017-01-13 — End: 2017-01-13
  Administered 2017-01-13: 40 meq via ORAL
  Filled 2017-01-13: qty 2

## 2017-01-13 NOTE — Progress Notes (Signed)
PHARMACIST - PHYSICIAN COMMUNICATION  DR:  Bonner Puna  CONCERNING: IV to Oral Route Change Policy  RECOMMENDATION: This patient is receiving Synthroid by the intravenous route.  Based on criteria approved by the Pharmacy and Therapeutics Committee, the intravenous medication(s) is/are being converted to the equivalent oral dose form(s).   DESCRIPTION: These criteria include:  The patient is eating (either orally or via tube) and/or has been taking other orally administered medications for a least 24 hours  The patient has no evidence of active gastrointestinal bleeding or impaired GI absorption (gastrectomy, short bowel, patient on TNA or NPO).  May crush in applesauce if needed.   If you have questions about this conversion, please contact the Pharmacy Department  []   [phone] )  [facility] []   [phone] )  [facility] []   [phone] )  [facility] []   [phone] )  [facility] [x]   [phone] )  [place], [facility] 01/13/2017 1:37 PM

## 2017-01-13 NOTE — Progress Notes (Addendum)
INTERIM PROGRESS NOTE  Subjective: Pt denies dyspnea or chest pain. She has been transferred to SDU.  Objective: BP 184/86  HR 100   Temp 100.9 F (Axillary)  RR 24  SpO2 96% on 1L   Gen: More alert sitting up in bed. Confused but conversant Pulm: Much more clear bilaterally, still diminished on left. Tachypneic, and still mildly labored.  CV: Regular tachycardia  01/13/2017 CXR 1v  Radiographic worsening with progressive opacification of the left hemithorax. Left lower lobe is now completely consolidated. Partial consolidation of the left upper lobe. Probable pleural fluid on the left. Pneumonia now developing in the right lung with some pleural fluid dependently and atelectasis/ infiltrate in the right lower lobe.   Assessment & Plan: I have reevaluated the patient every hour or so on the floor. WOB continued despite breathing treatments, so checked ABG, CXR. CXR shows severe worsening infiltrates. Suspect evolving inflammatory response vs. worsening MRSA PNA in setting of recently discontinued vancomycin in pt with very limited pulmonary reserve. ABG reassuring that she is ventilating sufficiently, though WOB still elevated. - Gave vancomycin STAT, would appreciate pharmacy assistance with restarting this. Continue zosyn. - BiPAP prn. - Spent a cumulative 45 minutes of critical care time, discussed at length the precarious position/risk of respiratory failure and death with the patient, husband and son at the bedside.  Vance Gather, MD [facility] Pager [phone] 01/13/2017, 4:51 PM

## 2017-01-13 NOTE — Progress Notes (Signed)
Pt's potassium 3.2. MD paged.

## 2017-01-13 NOTE — Progress Notes (Signed)
Pt was lying in bed and awake when [place] arrived. She opened the conversation w/good morning. Her husband spoke of her health having Parkinson's and dementia.  Pt's husband was appropriately concerned and attentive to her, especially when she became fidgety w/her covers.  Pt and husband talked about their family of five children and spoke of their son and daughter who are local and two children who are away. Husband expressed that one child passed away at birth. Pt and husband were very pleasant and appreciative of visit. Please page if additional support is needed. [place], [place]   01/13/17 1900  Clinical Encounter Type  Visited With Patient and family together

## 2017-01-13 NOTE — Plan of Care (Signed)
Problem: Education: Goal: Knowledge of [place] [facility] Education information/materials will improve Outcome: Not Progressing Advanced dementia

## 2017-01-13 NOTE — Progress Notes (Signed)
Pharmacy Antibiotic Note  Shannon Dunn is a 83 y.o. female  With hx dementia presented to the ED from SNF on 01/10/2017 with c/o SOB.  Vancomycin and cefepime started on admission for PNA.  With concern for aspiration, to change cefepime to zosyn on 6/28.  - Tmax 100.9, wbc 10.7, SCr ok (CrCl~29, low for high age and weight).   Plan: - Resume Vancomycin 500mg  q24h (dc'd earlier today so no doses were missed) - Cont Zosyn 3.375g IV Q8H infused over 4hrs. - Daily SCr - Consider Vanc trough tomorrow  ________________________  Height: 4\' 9"  (144.8 cm) Weight: 88 lb 6.5 oz (40.1 kg) IBW/kg (Calculated) : 38.6  Temp (24hrs), Avg:99.5 F (37.5 C), Min:97.5 F (36.4 C), Max:100.9 F (38.3 C)   Recent Labs Lab 01/10/17 1226 01/10/17 1325 01/10/17 1722 01/11/17 0328 01/12/17 0315 01/13/17 0420  WBC 11.8*  --   --   --  10.7* 10.7*  CREATININE 0.90  --   --  0.68 0.72 0.72  LATICACIDVEN  --  2.21* 0.7  --   --   --     Estimated Creatinine Clearance: 28.5 mL/min (by C-G formula based on SCr of 0.72 mg/dL).    Allergies  Allergen Reactions  . Zithromax [Azithromycin] Diarrhea  . Iodine Other (See Comments)    Unknown, might be rash/hives  . Ivp Dye [Iodinated Diagnostic Agents] Other (See Comments)    Unknown rash/hives maybe  . Shellfish Allergy Other (See Comments)    Unknown rash/hives, scallops    Antimicrobials this admission:  6/27 vanc >>   6/27 cefepime >> 6/28 6/28 zosyn(?asp) >>  Dose adjustments this admission:   -   Microbiology results:  6/27 BCx x2: NGTD 6/27 MRSA PCR: neg 6/28 UA: neg  Thank you for allowing pharmacy to be a part of this patient's care.  Romeo Rabon, PharmD, pager [phone]. 01/13/2017,5:08 PM.

## 2017-01-13 NOTE — Progress Notes (Signed)
Pt transferred to 1228. Pt belongings sent with pt. Pt's husband and son with pt at time of transfer.

## 2017-01-13 NOTE — Evaluation (Addendum)
Clinical/Bedside Swallow Evaluation Patient Details  Name: Shannon Dunn MRN: [id number] Date of Birth: 06-Mar-1924  Today's Date: 01/13/2017 Time: SLP Start Time (ACUTE ONLY): 0915 SLP Stop Time (ACUTE ONLY): 0950 SLP Time Calculation (min) (ACUTE ONLY): 35 min  Past Medical History:  Past Medical History:  Diagnosis Date  . Colon cancer ([place])   . COPD (chronic obstructive pulmonary disease) ([place])   . Dementia   . Osteoporosis   . Paget's bone disease   . Pagets disease, breast ([place])   . Parkinson's disease ([place])   . Pernicious anemia   . Renal disorder    Renal insufficiency  . Ventral hernia   . Vitamin D deficiency    Past Surgical History:  Past Surgical History:  Procedure Laterality Date  . BREAST SURGERY Left   . COLON SURGERY     bowel resection  . HIP ARTHROPLASTY Left 11/04/2015   Procedure: ARTHROPLASTY BIPOLAR HIP (HEMIARTHROPLASTY);  Surgeon: Tania Ade, MD;  Location: [facility];  Service: Orthopedics;  Laterality: Left;  . HIP PINNING,CANNULATED Left 11/04/2015   Procedure: CANNULATED HIP PINNING;  Surgeon: Tania Ade, MD;  Location: [facility];  Service: Orthopedics;  Laterality: Left;   HPI:  Pt is a 83 y.o. female with PMH of COPD/bronchiectasis, Parkinson's disease, Paget bone disease, osteoporosis, history of colon cancer was brought to the ER by the family on 6/27 due to concerns of abnormal breath sounds and decreased mentation. Pt had a productive cough for 2 weeks with greenish mucus. Reportedly concerns for aspiration at baseline. CXR 6/27 showed LLL infiltrate with L side pleural effusion. Pt had MBS 11/08/15 showing prolonged mastication, brief oral holding, pharyngeal residuals; dysphagia 2/ thin liquid diet/ meds crushed was recommended. Currently on dysphagia 1/ nectar thick liquid diet. Palliative following. Bedside swallow eval ordered.   Assessment / Plan / Recommendation Clinical Impression  Pt with congested cough following ~50% of trials of  nectar-thick and thin liquid consistencies; note that pt had productive cough prior to any PO trials as well. Pt declined trials of puree. Spoke with daughter at bedside at length regarding options for diet along with objective evaluation (MBS). Pt had MBS in 2017 and daughter reported that the evaluation stressed out the pt and she is not sure whether she would like to pursue a repeat evaluation at this time; she expressed goals for comfort for the pt and avoiding stressful situations, especially given her current state of increased confusion and her congestion. She reported that the pt had regular foods/ thin liquids at baseline. At this time given the pt's mental status and respiratory status, aspiration risk appears moderate-high. Recommend continuing dysphagia 1/ nectar thick liquids, allowing for free water protocol between meals following oral care for pt's comfort. Discussed rationale for MBS at length with daughter; she would like to see how the pt is doing in a day or two and will reconsider. Will continue to follow for diet tolerance/ consider advancement vs MBS.   SLP Visit Diagnosis: Dysphagia, unspecified (R13.10)    Aspiration Risk  Moderate aspiration risk;Severe aspiration risk    Diet Recommendation Dysphagia 1 (Puree);Nectar-thick liquid   Liquid Administration via: Cup Medication Administration: Crushed with puree Supervision: Staff to assist with self feeding;Full supervision/cueing for compensatory strategies Compensations: Minimize environmental distractions;Slow rate;Small sips/bites Postural Changes: Seated upright at 90 degrees    Other  Recommendations Oral Care Recommendations: Oral care BID;Oral care prior to ice chip/H20 Other Recommendations: Order thickener from pharmacy;Have oral suction available   Follow up  Recommendations Skilled Nursing facility      Frequency and Duration min 2x/week  2 weeks       Prognosis Prognosis for Safe Diet Advancement:  Fair Barriers to Reach Goals: Cognitive deficits      Swallow Study   General HPI: Pt is a 83 y.o. female with PMH of COPD/bronchiectasis, Parkinson's disease, Paget bone disease, osteoporosis, history of colon cancer was brought to the ER by the family on 6/27 due to concerns of abnormal breath sounds and decreased mentation. Pt had a productive cough for 2 weeks with greenish mucus. Reportedly concerns for aspiration at baseline. CXR 6/27 showed LLL infiltrate with L side pleural effusion. Pt had MBS 11/08/15 showing prolonged mastication, brief oral holding, pharyngeal residuals; dysphagia 2/ thin liquid diet/ meds crushed was recommended. Currently on dysphagia 1/ nectar thick liquid diet. Palliative following. Bedside swallow eval ordered. Type of Study: Bedside Swallow Evaluation Previous Swallow Assessment: April 2017 MBS- recommended D2/ thin liquids/ meds crushed Diet Prior to this Study: Dysphagia 1 (puree);Nectar-thick liquids Temperature Spikes Noted:  (low grade) Respiratory Status: Nasal cannula History of Recent Intubation: No Behavior/Cognition: Alert;Cooperative;Confused Oral Cavity - Dentition: Adequate natural dentition Vision: Functional for self-feeding Self-Feeding Abilities: Able to feed self;Needs assist Patient Positioning: Upright in bed Baseline Vocal Quality: Normal Volitional Cough: Congested    Oral/Motor/Sensory Function Overall Oral Motor/Sensory Function:  (difficult to assess)   Ice Chips Ice chips: Not tested   Thin Liquid Thin Liquid: Impaired Presentation: Cup Pharyngeal  Phase Impairments: Suspected delayed Swallow;Cough - Delayed    Nectar Thick Nectar Thick Liquid: Impaired Presentation: Cup;Straw Pharyngeal Phase Impairments: Cough - Delayed   Honey Thick Honey Thick Liquid: Not tested   Puree Puree: Not tested   Solid   GO   Solid: Not tested        Kern Reap, MA, CCC-SLP 01/13/2017,10:01 AM [phone]

## 2017-01-13 NOTE — Progress Notes (Signed)
Pt with increased work of breathing and temp 100.9. Respiratory paged for breathing treatment, Dr. Bonner Puna paged for change in status.

## 2017-01-13 NOTE — Progress Notes (Signed)
PROGRESS NOTE  Shannon Dunn  YQM:[id number] DOB: 04/05/1924 DOA: 01/10/2017 PCP: Renata Caprice, DO   Brief Narrative:  Shannon Dunn is a 83 y.o. female with medical history significant of COPD/bronchiectasis, Parkinson's disease, Paget bone disease, osteoporosis, history of colon cancer who was brought to the ER from Memory Care Unit by the family due to concerns of abnormal breath sounds and decreased mentation. History is per family due to patient's mental status.  Family reports a cough productive of green sputum for 2 weeks and subjective fever. At baseline she is able to recognize family members and interactive through short conversations but there are periods of sundowning, but developed drowsiness, prompting presentation to ED. In the ER patient was noted to be febrile with temperature of 101F. lactate of 2.2, elevated heart rate, tachypnea and signs of LLL pneumonia on the chest x-ray. She was placed on BiPAP for resp support with improvement in ABG, and started on vancomycin and cefepime. She continued on BiPAP overnight and weaned to nasal cannula 6/28. DNR status has been confirmed by palliative care consultant and the patient's mental status has improved.  Assessment & Plan: Active Problems:   HCAP (healthcare-associated pneumonia)   Malnutrition of moderate degree  Acute hypoxemic respiratory failure and sepsis secondary to LLL HCAP: Lactate improved, vital signs improved. - Sputum culture, having productive cough.  - Continue zosyn to cover aspiration and possibly pseudomonas. - SLP evaluation. - Confirmed DNR. Appreciate palliative care assistance with goals of care.  Acute metabolic encephalopathy: Due to sepsis - Monitor  COPD/bronchiectasis with exacerbation: Now with evidence of exacerbation with diffuse wheezing.  - Add prednisone 20mg  qAM. Monitor for exacerbation of mental status. - Continue supplemental oxygen as needed - Use inhaler/nebulizer as  needed  Parkinson disease - Home medications ordered. - SLP evaluation as above, plan to reevaluate once she advances functionally. - PT ordered, would like to mobilize the patient as part of prevention of deconditioning and delirium  Hypothyroidism: TSH at goal, 1.325 - Continue synthroid.  Dementia:  - Family hopeful for return to memory care unit. Will monitor progress.  - Continue exelon  History of osteoporosis on vitamin D supplements - Stable  Forehead lesion: Growing x2 months. Not infected - She's scheduled for biopsy. 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  DVT prophylaxis: Heparin Code Status: DNR confirmed Family Communication: Daughter at bedside Disposition Plan: Therapy evaluation, overall improving. Still not near baseline mental status, functional status, or respiratory status.   Consultants:   Palliative care medicine  Procedures:   BiPAP 6/27 - 6/28  Antimicrobials:  Vancomycin 6/27 >>  Cefepime 6/27 - 6/28  Zosyn 6/28 >>    Subjective: Did not sleep last night or yet this morning, cough has become productive without reported worsening of dyspnea, no chest pain or palpitations.    Objective: Vitals:   01/12/17 1454 01/13/17 0020 01/13/17 0619 01/13/17 0829  BP: 140/70 134/71 (!) 182/83   Pulse: 95 100 98   Resp: 20 20 20    Temp: 99 F (37.2 C) 99.9 F (37.7 C) 97.5 F (36.4 C)   TempSrc: Oral Oral Oral   SpO2: 100% 93% 94% 90%  Weight:      Height:        Intake/Output Summary (Last 24 hours) at 01/13/17 1213 Last data filed at 01/13/17 1000  Gross per 24 hour  Intake  1720 ml  Output                0 ml  Net             1720 ml   Filed Weights   01/10/17 1301 01/10/17 1703 01/12/17 0252  Weight: 38.1 kg (84 lb) 39 kg (85 lb 15.7 oz) 40.1 kg (88 lb 6.5 oz)    Examination: General exam: Elderly, frail female resting quietly,  alert Respiratory system: Nonlabored 4L by [place]. Decreased breath sounds at left base, diffuse expiratory wheezing today Cardiovascular system: Regular rate and rhythm. No JVD or edema Gastrointestinal system: Abdomen soft, non-tender, non-distended, with normoactive bowel sounds. No organomegaly or masses felt. Central nervous system: Alert, not oriented. Pleasantly confused without dysphasia. No focal deficits, diffuse weakness is moderate.   Extremities: Warm, no deformities Skin: No rashes. Forehead with ~2cm annular papule with squamous eschar without erythema or discharge. Psychiatry: UTD  Data Reviewed: I have personally reviewed following labs and imaging studies  CBC:  Recent Labs Lab 01/10/17 1226 01/12/17 0315 01/13/17 0420  WBC 11.8* 10.7* 10.7*  NEUTROABS 10.0*  --   --   HGB 11.1* 8.9* 9.4*  HCT 34.0* 27.9* 29.6*  MCV 90.9 90.6 92.8  PLT 369 285 159   Basic Metabolic Panel:  Recent Labs Lab 01/10/17 1226 01/11/17 0328 01/12/17 0315 01/13/17 0420  NA 134* 134* 139 143  K 4.7 3.7 3.6 3.2*  CL 97* 103 107 109  CO2 26 26 25 28   GLUCOSE 156* 122* 95 122*  BUN 21* 17 12 10   CREATININE 0.90 0.68 0.72 0.72  CALCIUM 9.0 7.8* 7.4* 7.4*   GFR: Estimated Creatinine Clearance: 28.5 mL/min (by C-G formula based on SCr of 0.72 mg/dL). Liver Function Tests:  Recent Labs Lab 01/11/17 0328  AST 11*  ALT 11*  ALKPHOS 58  BILITOT 0.9  PROT 6.5  ALBUMIN 2.9*   CBG:  Recent Labs Lab 01/11/17 0543 01/12/17 0611 01/13/17 0731  GLUCAP 112* 97 130*   Thyroid Function Tests:  Recent Labs  01/13/17 0420  TSH 1.325   Urine analysis:    Component Value Date/Time   COLORURINE YELLOW 01/11/2017 [place] 01/11/2017 0643   LABSPEC 1.012 01/11/2017 0643   PHURINE 5.0 01/11/2017 0643   GLUCOSEU NEGATIVE 01/11/2017 0643   HGBUR NEGATIVE 01/11/2017 0643   BILIRUBINUR NEGATIVE 01/11/2017 [place] 01/11/2017 0643   PROTEINUR  NEGATIVE 01/11/2017 0643   UROBILINOGEN 0.2 04/09/2015 1355   NITRITE NEGATIVE 01/11/2017 0643   LEUKOCYTESUR NEGATIVE 01/11/2017 0643   Recent Results (from the past 240 hour(s))  Culture, blood (routine x 2)     Status: None (Preliminary result)   Collection Time: 01/10/17  1:17 PM  Result Value Ref Range Status   Specimen Description BLOOD RIGHT ANTECUBITAL  Final   Special Requests   Final    BOTTLES DRAWN AEROBIC AND ANAEROBIC Blood Culture adequate volume   Culture   Final    NO GROWTH 2 DAYS Performed at [facility], [place] [address], [place], [place] [address]    Report Status PENDING  Incomplete  Culture, blood (routine x 2)     Status: None (Preliminary result)   Collection Time: 01/10/17  5:22 PM  Result Value Ref Range Status   Specimen Description BLOOD LEFT ARM  Final   Special Requests   Final    BOTTLES DRAWN AEROBIC AND ANAEROBIC Blood Culture adequate volume  Culture   Final    NO GROWTH 2 DAYS Performed at [facility], [place] [address] [place], [place] [address]    Report Status PENDING  Incomplete  MRSA PCR Screening     Status: None   Collection Time: 01/10/17  8:00 PM  Result Value Ref Range Status   MRSA by PCR NEGATIVE NEGATIVE Final    Comment:        The GeneXpert MRSA Assay (FDA approved for NASAL specimens only), is one component of a comprehensive MRSA colonization surveillance program. It is not intended to diagnose MRSA infection nor to guide or monitor treatment for MRSA infections.       Radiology Studies: No results found.  Scheduled Meds: . budesonide  0.25 mg Nebulization QPC breakfast  . Carbidopa-Levodopa ER  1 capsule Oral QID  . levothyroxine  12.5 mcg Intravenous Daily  . rasagiline  1 mg Oral Q1400  . rivastigmine  13.3 mg Transdermal Daily  . rotigotine  1 patch Transdermal Daily   Continuous Infusions: . dextrose 5 % and 0.9% NaCl 75 mL/hr at 01/12/17 1034  . piperacillin-tazobactam (ZOSYN)  IV 3.375 g  (01/13/17 1001)     LOS: 3 days   Time spent: 25 minutes.  Vance Gather, MD [facility] Pager [phone]  If 7PM-7AM, please contact night-coverage [email or web address] Password [email or web address] 01/13/2017, 12:13 PM

## 2017-01-14 LAB — CBC
HCT: 28.7 % — ABNORMAL LOW (ref 36.0–46.0)
Hemoglobin: 8.9 g/dL — ABNORMAL LOW (ref 12.0–15.0)
MCH: 28.3 pg (ref 26.0–34.0)
MCHC: 31 g/dL (ref 30.0–36.0)
MCV: 91.4 fL (ref 78.0–100.0)
Platelets: 281 10*3/uL (ref 150–400)
RBC: 3.14 MIL/uL — ABNORMAL LOW (ref 3.87–5.11)
RDW: 16.3 % — AB (ref 11.5–15.5)
WBC: 7.6 10*3/uL (ref 4.0–10.5)

## 2017-01-14 LAB — GLUCOSE, CAPILLARY: GLUCOSE-CAPILLARY: 103 mg/dL — AB (ref 65–99)

## 2017-01-14 LAB — BASIC METABOLIC PANEL
Anion gap: 6 (ref 5–15)
BUN: 9 mg/dL (ref 6–20)
CALCIUM: 7.4 mg/dL — AB (ref 8.9–10.3)
CO2: 27 mmol/L (ref 22–32)
CREATININE: 0.64 mg/dL (ref 0.44–1.00)
Chloride: 110 mmol/L (ref 101–111)
GFR calc non Af Amer: >60 mL/min (ref >60)
Glucose, Bld: 119 mg/dL — ABNORMAL HIGH (ref 65–99)
Potassium: 3.8 mmol/L (ref 3.5–5.1)
SODIUM: 143 mmol/L (ref 135–145)

## 2017-01-14 LAB — VANCOMYCIN, TROUGH: VANCOMYCIN TR: 7 ug/mL — AB (ref 15–20)

## 2017-01-14 MED ORDER — VANCOMYCIN HCL 500 MG IV SOLR
500.0000 mg | Freq: Two times a day (BID) | INTRAVENOUS | Status: DC
  Administered 2017-01-15 – 2017-01-16 (×3): 500 mg via INTRAVENOUS
  Filled 2017-01-14 (×4): qty 500

## 2017-01-14 MED ORDER — HYDRALAZINE HCL 20 MG/ML IJ SOLN
5.0000 mg | INTRAMUSCULAR | Status: DC | PRN
  Administered 2017-01-14 – 2017-01-17 (×2): 5 mg via INTRAVENOUS
  Filled 2017-01-14 (×2): qty 1
  Filled 2017-01-14: qty 0.25

## 2017-01-14 NOTE — Progress Notes (Signed)
  Speech Language Pathology Treatment: Dysphagia  Patient Details Name: Shannon Dunn MRN: [id number] DOB: 1924-05-16 Today's Date: 01/14/2017 Time:  -     Assessment / Plan / Recommendation Clinical Impression  Visited pt and husband to check in for needs. Husband needed further explanation of diet recommendations and precautions per RN. Demonstrated hand over hand feeding as pt is easily distractible when trying to feed herself. No immediate signs of aspiration with sips of nectar thick liquids. Given SLP discussion of plan of care with daughter yesterday, will not make any changes to diet. However, pts intake has decreased with modified diet and liberalization may be warranted to improve comfort if that is the goal for care. Will follow for further discussion with pts daughter.   HPI HPI: Pt is a 83 y.o. female with PMH of COPD/bronchiectasis, Parkinson's disease, Paget bone disease, osteoporosis, history of colon cancer was brought to the ER by the family on 6/27 due to concerns of abnormal breath sounds and decreased mentation. Pt had a productive cough for 2 weeks with greenish mucus. Reportedly concerns for aspiration at baseline. CXR 6/27 showed LLL infiltrate with L side pleural effusion. Pt had MBS 11/08/15 showing prolonged mastication, brief oral holding, pharyngeal residuals; dysphagia 2/ thin liquid diet/ meds crushed was recommended. Currently on dysphagia 1/ nectar thick liquid diet. Palliative following. Bedside swallow eval ordered.      SLP Plan  Continue with current plan of care       Recommendations  Diet recommendations: Nectar-thick liquid;Dysphagia 1 (puree) Liquids provided via: Cup Medication Administration: Crushed with puree Supervision: Full supervision/cueing for compensatory strategies Compensations: Minimize environmental distractions;Slow rate;Small sips/bites Postural Changes and/or Swallow Maneuvers: Seated upright 90 degrees                Oral Care  Recommendations: Oral care BID;Oral care prior to ice chip/H20 Follow up Recommendations: Skilled Nursing facility Plan: Continue with current plan of care       GO              [facility], MA CCC-SLP [phone]   Lynann Beaver 01/14/2017, 3:35 PM

## 2017-01-14 NOTE — Progress Notes (Signed)
Pharmacy Brief Antibiotic Note  Vancomycin trough 7mg /L, below goal 15-20mg /L on 500mg  q24h. So will increase to 500mg  q12h and recheck at steady-state.   Romeo Rabon, PharmD, pager [phone]. 01/14/2017,4:01 PM.

## 2017-01-14 NOTE — Progress Notes (Signed)
PROGRESS NOTE  Shannon Dunn  GNF:[id number] DOB: Sep 26, 1923 DOA: 01/10/2017 PCP: Renata Caprice, DO   Brief Narrative:  Shannon Dunn is a 83 y.o. female with medical history significant of COPD/bronchiectasis, Parkinson's disease, Paget bone disease, osteoporosis, history of colon cancer who was brought to the ER from Memory Care Unit by the family due to concerns of abnormal breath sounds and decreased mentation. History is per family due to patient's mental status.  Family reports a cough productive of green sputum for 2 weeks and subjective fever. At baseline she is able to recognize family members and interactive through short conversations but there are periods of sundowning, but developed drowsiness, prompting presentation to ED. In the ER patient was noted to be febrile with temperature of 101F. lactate of 2.2, elevated heart rate, tachypnea and signs of LLL pneumonia on the chest x-ray. She was placed on BiPAP for resp support with improvement in ABG, and started on vancomycin and cefepime. She continued on BiPAP overnight and weaned to nasal cannula 6/28. DNR status has been confirmed by palliative care consultant and the patient's mental status has improved.  Assessment & Plan: Active Problems:   Bronchiectasis without acute exacerbation (HCC)   Dementia   Parkinson's disease ([place])   HCAP (healthcare-associated pneumonia)   Acute respiratory failure with hypoxia ([place])   Hypothyroidism   Malnutrition of moderate degree  Acute hypoxemic respiratory failure and sepsis secondary to LLL HCAP: Initial improvement followed by interval worsening with significant left lung opacification extending to right on 6/30. Transferred back to SDU 6/30 though he not required NIPPV. - Sputum culture 6/30, pending - Continue broad-spectrum with vancomycin, zosyn.  - SLP evaluation to be repeated after some improvement of acute illness. Continue dysphagia 1. With evolution of infiltrates, do not suspect  aspiration to be primary cause.   - Confirmed DNR, was seen by palliative care.   Acute metabolic encephalopathy: Due to sepsis - Monitor, improving, now with mostly delirium.   COPD/bronchiectasis with exacerbation:  - Added prednisone 20mg  6/30. Monitor for exacerbation of mental status. - BiPAP prn. - Continue supplemental oxygen as needed - Use inhaler/nebulizer as needed  Parkinson disease - Home medications ordered. - SLP evaluation as above, plan to reevaluate once she advances functionally. - PT ordered, would like to mobilize the patient as part of prevention of deconditioning and delirium if able.  Hypothyroidism: TSH at goal, 1.325 - Continue synthroid.  HTN: Exacerbated by steroids.  - Prn hydralazine ordered   Dementia:  - Family hopeful for return to memory care unit. Will monitor progress.  - Continue exelon  History of osteoporosis on vitamin D supplements - Stable  Forehead lesion: Growing x2 months. Not infected - She's scheduled for biopsy. 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  DVT prophylaxis: Heparin Code Status: DNR confirmed Family Communication: Daughter at bedside Disposition Plan: Continue SDU monitoring in case of need for BiPAP.   Consultants:   Palliative care medicine  Procedures:   BiPAP 6/27 - 6/28  Antimicrobials:  Vancomycin 6/27 >>  Cefepime 6/27 - 6/28  Zosyn 6/28 >>    Subjective: Slept well overnight, has not required BiPAP. Pt has no complaints, confused pleasantly.   Objective: Vitals:   01/14/17 0200 01/14/17 0329 01/14/17 0800 01/14/17 0817  BP: (!) 150/89 (!) 145/86    Pulse: 90   96  Resp: 19   18  Temp:  98.8 F (37.1 C)  98.6 F (37 C)   TempSrc:  Axillary Oral   SpO2: 99%   97%  Weight:      Height:        Intake/Output Summary (Last 24 hours) at 01/14/17 9892 Last data filed at 01/13/17 1730  Gross per 24 hour    Intake              380 ml  Output              300 ml  Net               80 ml   Filed Weights   01/10/17 1301 01/10/17 1703 01/12/17 0252  Weight: 38.1 kg (84 lb) 39 kg (85 lb 15.7 oz) 40.1 kg (88 lb 6.5 oz)    Examination: General exam: Elderly, frail female resting quietly Respiratory system: Supraclavicular retractions, normal rate on 4L by [place]. Decreased left side > right side, scant end-expiratory wheezes. Cardiovascular system: Regular rate and rhythm. No JVD or edema Gastrointestinal system: Abdomen soft, non-tender, non-distended, with normoactive bowel sounds. No organomegaly or masses felt. Central nervous system: Alert, not oriented. Pleasantly confused without dysphasia. No focal deficits, diffuse weakness is moderate.   MSK: Significant kypho-scoliosis. Warm extremities without deformities Skin: No rashes. Forehead with ~2cm annular papule with squamous eschar without erythema or discharge. Psychiatry: UTD  Data Reviewed: I have personally reviewed following labs and imaging studies  CBC:  Recent Labs Lab 01/10/17 1226 01/12/17 0315 01/13/17 0420 01/14/17 0340  WBC 11.8* 10.7* 10.7* 7.6  NEUTROABS 10.0*  --   --   --   HGB 11.1* 8.9* 9.4* 8.9*  HCT 34.0* 27.9* 29.6* 28.7*  MCV 90.9 90.6 92.8 91.4  PLT 369 285 320 119   Basic Metabolic Panel:  Recent Labs Lab 01/10/17 1226 01/11/17 0328 01/12/17 0315 01/13/17 0420 01/14/17 0340  NA 134* 134* 139 143 143  K 4.7 3.7 3.6 3.2* 3.8  CL 97* 103 107 109 110  CO2 26 26 25 28 27   GLUCOSE 156* 122* 95 122* 119*  BUN 21* 17 12 10 9   CREATININE 0.90 0.68 0.72 0.72 0.64  CALCIUM 9.0 7.8* 7.4* 7.4* 7.4*   GFR: Estimated Creatinine Clearance: 28.5 mL/min (by C-G formula based on SCr of 0.64 mg/dL). Liver Function Tests:  Recent Labs Lab 01/11/17 0328  AST 11*  ALT 11*  ALKPHOS 58  BILITOT 0.9  PROT 6.5  ALBUMIN 2.9*   CBG:  Recent Labs Lab 01/11/17 0543 01/12/17 0611 01/13/17 0731  01/14/17 0801  GLUCAP 112* 97 130* 103*   Thyroid Function Tests:  Recent Labs  01/13/17 0420  TSH 1.325   Urine analysis:    Component Value Date/Time   COLORURINE YELLOW 01/11/2017 [place] 01/11/2017 0643   LABSPEC 1.012 01/11/2017 0643   PHURINE 5.0 01/11/2017 0643   GLUCOSEU NEGATIVE 01/11/2017 0643   HGBUR NEGATIVE 01/11/2017 0643   BILIRUBINUR NEGATIVE 01/11/2017 [place] 01/11/2017 0643   PROTEINUR NEGATIVE 01/11/2017 0643   UROBILINOGEN 0.2 04/09/2015 1355   NITRITE NEGATIVE 01/11/2017 0643   LEUKOCYTESUR NEGATIVE 01/11/2017 0643   Recent Results (from the past 240 hour(s))  Culture, blood (routine x 2)     Status: None (Preliminary result)   Collection Time: 01/10/17  1:17 PM  Result Value Ref Range Status   Specimen Description BLOOD RIGHT ANTECUBITAL  Final   Special Requests   Final    BOTTLES DRAWN AEROBIC AND ANAEROBIC Blood  Culture adequate volume   Culture   Final    NO GROWTH 3 DAYS Performed at [facility], [place] [address], [place], [place] [address]    Report Status PENDING  Incomplete  Culture, blood (routine x 2)     Status: None (Preliminary result)   Collection Time: 01/10/17  5:22 PM  Result Value Ref Range Status   Specimen Description BLOOD LEFT ARM  Final   Special Requests   Final    BOTTLES DRAWN AEROBIC AND ANAEROBIC Blood Culture adequate volume   Culture   Final    NO GROWTH 3 DAYS Performed at [facility], [address], [place], [place] [address]    Report Status PENDING  Incomplete  MRSA PCR Screening     Status: None   Collection Time: 01/10/17  8:00 PM  Result Value Ref Range Status   MRSA by PCR NEGATIVE NEGATIVE Final    Comment:        The GeneXpert MRSA Assay (FDA approved for NASAL specimens only), is one component of a comprehensive MRSA colonization surveillance program. It is not intended to diagnose MRSA infection nor to guide or monitor treatment for MRSA  infections.   Culture, expectorated sputum-assessment     Status: None   Collection Time: 01/13/17 12:21 PM  Result Value Ref Range Status   Specimen Description SPUTUM  Final   Special Requests NONE  Final   Sputum evaluation THIS SPECIMEN IS ACCEPTABLE FOR SPUTUM CULTURE  Final   Report Status 01/13/2017 FINAL  Final  Culture, respiratory (NON-Expectorated)     Status: None (Preliminary result)   Collection Time: 01/13/17 12:25 PM  Result Value Ref Range Status   Specimen Description SPUTUM  Final   Special Requests NONE  Final   Gram Stain   Final    ABUNDANT WBC PRESENT, PREDOMINANTLY PMN RARE GRAM POSITIVE COCCI IN PAIRS RARE GRAM NEGATIVE RODS RARE GRAM POSITIVE RODS Performed at [facility], [address] [place], [place] [address]    Culture PENDING  Incomplete   Report Status PENDING  Incomplete      Radiology Studies: Dg Chest Port 1 View  Result Date: 01/13/2017 CLINICAL DATA:  Shortness of breath today. Follow-up left pneumonia. EXAM: PORTABLE CHEST 1 VIEW COMPARISON:  01/10/2017 FINDINGS: Radiographic worsening with progressive opacification of the left hemithorax. Left lower lobe is now completely consolidated. Partial consolidation of the left upper lobe. Probable pleural fluid on the left. Pneumonia now developing in the right lung with some pleural fluid dependently and atelectasis/ infiltrate in the right lower lobe. IMPRESSION: Marked radiographic worsening as outlined above. Electronically Signed   By: Nelson Chimes M.D.   On: 01/13/2017 15:37    Scheduled Meds: . budesonide  0.25 mg Nebulization QPC breakfast  . Carbidopa-Levodopa ER  1 capsule Oral QID  . levothyroxine  25 mcg Oral QAC breakfast  . predniSONE  20 mg Oral Q breakfast  . rasagiline  1 mg Oral Q1400  . rivastigmine  13.3 mg Transdermal Daily  . rotigotine  1 patch Transdermal Daily   Continuous Infusions: . piperacillin-tazobactam (ZOSYN)  IV Stopped (01/14/17 0700)  . vancomycin        LOS: 4 days   Time spent: 25 minutes.  Vance Gather, MD [facility] Pager [phone]  If 7PM-7AM, please contact night-coverage [email or web address] Password [email or web address] 01/14/2017, 8:22 AM

## 2017-01-14 NOTE — Evaluation (Addendum)
Physical Therapy Evaluation Patient Details Name: NEIRA BENTSEN MRN: [id number] DOB: [birth year] Today's Date: 01/14/2017   History of Present Illness  Marguerita Stapp Klemis a 83 y.o.femalewith medical history significant of COPD/bronchiectasis, ORIF hip, Parkinson's disease, Paget bone disease, osteoporosis, history of colon cancer who was brought to the ER from [facility] by the family due to concerns of abnormal breath sounds and decreased mentation. History is per family due to patient's mental status.  Clinical Impression  Pt admitted with above diagnosis. Pt currently with functional limitations due to the deficits listed below (see PT Problem List).  Pt will benefit from skilled PT to increase their independence and safety with mobility to allow discharge to the venue listed below.   Pt cooperative but requires multi-modal cues for all aspects of mobility tasks; recommend SNF unless pt improves significantly--will be dependent on LOS; will continue to follow in acute setting  VS:  HR 104-105 O2 sats 92--84--94% on 3L DOE 2-3/4 with RR 22-25      Follow Up Recommendations SNF (if progresses--return to [place] care)    Equipment Recommendations  None recommended by PT    Recommendations for Other Services       Precautions / Restrictions Precautions Precautions: Fall      Mobility  Bed Mobility Overal bed mobility: Needs Assistance Bed Mobility: Supine to Sit     Supine to sit: +2 for safety/equipment;+2 for physical assistance;Min assist     General bed mobility comments: multi-modal cues for attention to task and incr self assist; +2 for trunk control/elevation and lines  Transfers Overall transfer level: Needs assistance Equipment used: Rolling walker (2 wheeled);2 person hand held assist Transfers: Sit to/from Stand;Stand Pivot Transfers Sit to Stand: +2 physical assistance;+2 safety/equipment;Mod assist Stand pivot transfers: Max assist;+2  physical assistance;+2 safety/equipment       General transfer comment: pt requiring heavy max assist (pt is tiny) for stand pivot transfer d/t significnat posterior bias, she prefers to hold onto RW although it was more of a hinderance;  pt not easily compliant with HHA; multi-modal cues for hand position, wt shift--all aspects of transfer to chair  Ambulation/Gait             General Gait Details: unable  Stairs            Wheelchair Mobility    Modified Rankin (Stroke Patients Only)       Balance Overall balance assessment: Needs assistance;History of Falls Sitting-balance support: Feet supported Sitting balance-Leahy Scale: Poor Sitting balance - Comments: poor to fair; pt is petite and bed ht places her at a disadvantage  Postural control: Posterior lean   Standing balance-Leahy Scale: Zero                               Pertinent Vitals/Pain      Home Living                   Additional Comments: pt in memory care unit at [facility]; Functional ambulator with and without AD    Prior Function Level of Independence: Needs assistance   Gait / Transfers Assistance Needed: wanders, rearranges furniture  ADL's / Homemaking Assistance Needed: staff assists with ADLs, self feeds;        Hand Dominance        Extremity/Trunk Assessment   Upper Extremity Assessment Upper Extremity Assessment: Generalized weakness    Lower Extremity  Assessment Lower Extremity Assessment: Generalized weakness    Cervical / Trunk Assessment Cervical / Trunk Assessment: Kyphotic  Communication   Communication: No difficulties  Cognition Arousal/Alertness: Awake/alert Behavior During Therapy: WFL for tasks assessed/performed Overall Cognitive Status: History of cognitive impairments - at baseline                                        General Comments      Exercises     Assessment/Plan    PT Assessment Patient needs  continued PT services  PT Problem List Decreased strength;Decreased activity tolerance;Decreased balance;Decreased mobility;Pain;Decreased knowledge of use of DME;Decreased safety awareness       PT Treatment Interventions DME instruction;Functional mobility training;Therapeutic activities;Gait training;Therapeutic exercise;Patient/family education;Balance training    PT Goals (Current goals can be found in the Care Plan section)  Acute Rehab PT Goals Patient Stated Goal: none stated PT Goal Formulation: Patient unable to participate in goal setting Time For Goal Achievement: 01/28/17 Potential to Achieve Goals: Fair    Frequency Min 3X/week   Barriers to discharge        Co-evaluation               AM-PAC PT "6 Clicks" Daily Activity  Outcome Measure Difficulty turning over in bed (including adjusting bedclothes, sheets and blankets)?: Total Difficulty moving from lying on back to sitting on the side of the bed? : Total Difficulty sitting down on and standing up from a chair with arms (e.g., wheelchair, bedside commode, etc,.)?: Total Help needed moving to and from a bed to chair (including a wheelchair)?: Total Help needed walking in hospital room?: Total Help needed climbing 3-5 steps with a railing? : Total 6 Click Score: 6    End of Session Equipment Utilized During Treatment: Gait belt Activity Tolerance: Patient tolerated treatment well Patient left: in chair;with call bell/phone within reach;with family/visitor present;with chair alarm set   PT Visit Diagnosis: Other abnormalities of gait and mobility (R26.89)    Time: 9458-5929 PT Time Calculation (min) (ACUTE ONLY): 28 min   Charges:   PT Evaluation $PT Eval Moderate Complexity: 1 Procedure PT Treatments $Therapeutic Activity: 8-22 mins   PT G CodesKenyon Ana, PT Pager: [phone] 01/14/2017   Kenyon Ana 01/14/2017, 1:12 PM

## 2017-01-14 NOTE — Progress Notes (Signed)
Pharmacy Antibiotic Note  Shannon Dunn is a 83 y.o. female  with hx dementia presented to the ED from SNF on 01/10/2017 with c/o SOB.  Vancomycin and cefepime started on admission for PNA.  With concern for aspiration, Cefepime was changed to Zosyn on 6/28. Vancomycin stopped on 6/30, but resumed when CXR showed worsening PNA (no doses missed).  - Tmax 100.9, wbc 7.6, SCr ok (CrCl~25-30, low d/t advanced age and low body weight).   Plan: - Continue Vancomycin 500mg  q24h - Cont Zosyn 3.375g IV Q8H infused over 4hrs. - Daily SCr - Check Vanc trough prior to 4pm dose - Duration of therapy per MD  ________________________  Height: 4\' 9"  (144.8 cm) Weight: 88 lb 6.5 oz (40.1 kg) IBW/kg (Calculated) : 38.6  Temp (24hrs), Avg:99.1 F (37.3 C), Min:98.2 F (36.8 C), Max:100.9 F (38.3 C)   Recent Labs Lab 01/10/17 1226 01/10/17 1325 01/10/17 1722 01/11/17 0328 01/12/17 0315 01/13/17 0420 01/14/17 0340  WBC 11.8*  --   --   --  10.7* 10.7* 7.6  CREATININE 0.90  --   --  0.68 0.72 0.72 0.64  LATICACIDVEN  --  2.21* 0.7  --   --   --   --     Estimated Creatinine Clearance: 28.5 mL/min (by C-G formula based on SCr of 0.64 mg/dL).    Allergies  Allergen Reactions  . Zithromax [Azithromycin] Diarrhea  . Iodine Other (See Comments)    Unknown, might be rash/hives  . Ivp Dye [Iodinated Diagnostic Agents] Other (See Comments)    Unknown rash/hives maybe  . Shellfish Allergy Other (See Comments)    Unknown rash/hives, scallops    Antimicrobials this admission:  6/27 vanc >>   6/27 cefepime >> 6/28 6/28 zosyn(?asp) >>  Dose adjustments this admission:   -   Microbiology results:  6/27 BCx x2: NGTD 6/27 MRSA PCR: neg 6/28 UA: neg 6/30 Sputum Cx: sent  Thank you for allowing pharmacy to be a part of this patient's care.  Netta Cedars, PharmD, BCPS Pager: [phone]  01/14/2017,11:09 AM.

## 2017-01-15 ENCOUNTER — Inpatient Hospital Stay (HOSPITAL_COMMUNITY): Payer: Medicare Other

## 2017-01-15 DIAGNOSIS — E034 Atrophy of thyroid (acquired): Secondary | ICD-10-CM

## 2017-01-15 LAB — GLUCOSE, CAPILLARY: Glucose-Capillary: 110 mg/dL — ABNORMAL HIGH (ref 65–99)

## 2017-01-15 LAB — CBC
HCT: 30.4 % — ABNORMAL LOW (ref 36.0–46.0)
Hemoglobin: 9.6 g/dL — ABNORMAL LOW (ref 12.0–15.0)
MCH: 29.4 pg (ref 26.0–34.0)
MCHC: 31.6 g/dL (ref 30.0–36.0)
MCV: 93 fL (ref 78.0–100.0)
PLATELETS: 339 10*3/uL (ref 150–400)
RBC: 3.27 MIL/uL — ABNORMAL LOW (ref 3.87–5.11)
RDW: 16.6 % — AB (ref 11.5–15.5)
WBC: 8.4 10*3/uL (ref 4.0–10.5)

## 2017-01-15 LAB — CULTURE, BLOOD (ROUTINE X 2)
CULTURE: NO GROWTH
CULTURE: NO GROWTH
SPECIAL REQUESTS: ADEQUATE
Special Requests: ADEQUATE

## 2017-01-15 LAB — CREATININE, SERUM
CREATININE: 0.77 mg/dL (ref 0.44–1.00)
GFR calc Af Amer: >60 mL/min (ref >60)
GFR calc non Af Amer: >60 mL/min (ref >60)

## 2017-01-15 MED ORDER — ACETYLCYSTEINE 20 % IN SOLN
2.0000 mL | Freq: Three times a day (TID) | RESPIRATORY_TRACT | Status: DC
  Administered 2017-01-15 – 2017-01-17 (×5): 2 mL via RESPIRATORY_TRACT
  Filled 2017-01-15 (×8): qty 4

## 2017-01-15 MED ORDER — HYDRALAZINE HCL 10 MG PO TABS
10.0000 mg | ORAL_TABLET | Freq: Three times a day (TID) | ORAL | Status: DC
  Administered 2017-01-15 – 2017-01-23 (×21): 10 mg via ORAL
  Filled 2017-01-15 (×30): qty 1

## 2017-01-15 NOTE — Progress Notes (Signed)
Nutrition Follow-up  DOCUMENTATION CODES:   Non-severe (moderate) malnutrition in context of chronic illness  INTERVENTION:  - Continue Magic Cup TID with meals per protocol, each supplement provides 290 kcal and 9 grams of protein  - Will order Mighty Shake II BID in Health Touch, each supplement provides 500 kcal and 23 grams of protein. Is appropriate for nectar-thick need. - Continue to encourage PO intakes of meals and supplements. - RD will continue to monitor for needs.  NUTRITION DIAGNOSIS:   Malnutrition (moderate/non-severe) related to chronic illness (COPD, Parkinson's, advanced age) as evidenced by mild depletion of body fat, mild depletion of muscle mass, moderate depletions of muscle mass. -ongoing  GOAL:   Patient will meet greater than or equal to 90% of their needs -unmet  MONITOR:   PO intake, Supplement acceptance, Weight trends, Labs  ASSESSMENT:   83 y.o. female with medical history significant of COPD/bronchiectasis, Parkinson's disease, Paget bone disease, osteoporosis, history of colon cancer was brought to the ER by the family due to concerns of abnormal breath sounds and decreased mentation.She lives at [facility] group home. At baseline she is able to recognize family members and interactive through short conversations but there are periods of sundowning. Patient has been eating small bites and meals at a time but there is still some concerns of aspiration. Patient was admitted here last year in April for left hip fracture and at that time she was evaluated by speech and swallow who recommended her to be on dysphagia 1 diet.  7/2 Pt/family seen by Palliative Care 6/28 afternoon and pt is DNR/DNI. Family is hopeful for d/c back to [facility] memory care unit when medically stable. Pt is on Dysphagia 1, nectar-thick liquids and SLP note yesterday afternoon states to continue this diet. Per chart review, pt consumed 50% of dinner 6/29, bites of breakfast and  25% of dinner on 6/30. No family/visitors present at this time. Pt reports she ate a few bites of breakfast and drank carton of (thickened0 orange juice. Magic Cup on bedside table with a few small bites taken; pt to receive this supplement on all meal trays per protocol.   Medications reviewed; 25 mcg oral Synthroid/day, 20 mg Deltasone/day. Labs reviewed; Ca: 7.4 mg/dL.   6/28 - Pt on BiPAP and unable to communicate; no family/visitors present at this time.  - Palliative Care consulted yesterday afternoon; will monitor for [place].  - Physical assessment shows mild fat and mild/moderate muscle wasting.  - Mild BLE edema also noted.  - Per chart review, no weight since 12/02/16 and pt weighed 77 lbs at that time (currently 85 lbs).  - Her weight has been 76-91 lbs since 04/09/15.  IVF: D5-1/2 NS @ 75 mL/hr (306 kcal from dextrose).   Diet Order:  DIET - DYS 1 Room service appropriate? Yes with Assist; Fluid consistency: Nectar Thick  Skin:  Reviewed, no issues  Last BM:  7/2  Height:   Ht Readings from Last 1 Encounters:  01/10/17 4\' 9"  (1.448 m)    Weight:   Wt Readings from Last 1 Encounters:  01/15/17 84 lb 7 oz (38.3 kg)    Ideal Body Weight:  41.45 kg  BMI:  Body mass index is 18.27 kg/m.  Estimated Nutritional Needs:   Kcal:  1090-1250 (28-32 kcal/kg)  Protein:  40-50 grams  Fluid:  >/= 1.2 L/day  EDUCATION NEEDS:   No education needs identified at this time    Jarome Matin, MS, RD, LDN, CNSC Inpatient Clinical  Dietitian Pager # [phone] After hours/weekend pager # [phone]

## 2017-01-15 NOTE — Progress Notes (Signed)
Patient asleep. Holding CPT at this time per request for patient to sleep Investment banker, [employer]). RT will continue to monitor.

## 2017-01-15 NOTE — Plan of Care (Signed)
Problem: Safety: Goal: Ability to remain free from injury will improve Outcome: Progressing Kobie will remain safe without injury from fall while in hospital  Problem: Pain Managment: Goal: General experience of comfort will improve Outcome: Progressing Zania will have any pain issues addressed while in hospital for goal of comfort  Problem: Skin Integrity: Goal: Risk for impaired skin integrity will decrease Outcome: Progressing Hajra will maintain skin integrity while in hospital by implementing turning every two hours and monitoring closely

## 2017-01-15 NOTE — Progress Notes (Signed)
CSW assisting with d/c planning. Updated PN / PT notes sent to [facility] for review. SNF may be required at d/c. CSW will continue to follow to assist with d/c planning needs.  Werner Lean LCSW [phone]

## 2017-01-15 NOTE — Progress Notes (Signed)
PROGRESS NOTE  Shannon Dunn  HYQ:[id number] DOB: [birth year] DOA: 01/10/2017 PCP: Renata Caprice, DO   Brief Narrative:  Shannon Dunn is a 83 y.o. female with medical history significant of COPD/bronchiectasis, Parkinson's disease, Paget bone disease, osteoporosis, history of colon cancer who was brought to the ER from Memory Care Unit by the family due to concerns of abnormal breath sounds and decreased mentation. History is per family due to patient's mental status.  Family reports a cough productive of green sputum for 2 weeks and subjective fever. At baseline she is able to recognize family members and interactive through short conversations but there are periods of sundowning, but developed drowsiness, prompting presentation to ED. In the ER patient was noted to be febrile with temperature of 101F. lactate of 2.2, elevated heart rate, tachypnea and signs of LLL pneumonia on the chest x-ray. She was placed on BiPAP for resp support with improvement in ABG, and started on vancomycin and cefepime. She continued on BiPAP overnight and weaned to nasal cannula 6/28. DNR status has been confirmed by palliative care consultant and the patient's mental status improved, prompting transfer to the floor 6/29. The patient developed increased work of breathing and wheezing 6/30. Repeat CXR showed interval white-out of left lung, and she was returned to SDU but has not required BiPAP. Prednisone has been given for COPD/bronchiectasis exacerbation in addition to broad spectrum antibiotics.  Assessment & Plan: Active Problems:   Bronchiectasis without acute exacerbation (HCC)   Dementia   Parkinson's disease ([place])   HCAP (healthcare-associated pneumonia)   Acute respiratory failure with hypoxia ([place])   Hypothyroidism   Malnutrition of moderate degree  Acute hypoxemic respiratory failure and sepsis secondary to LLL HCAP: Initial improvement followed by interval worsening with significant left lung  opacification extending to right on 6/30. Transferred back to SDU 6/30 though has not required NIPPV. - BiPAP prn, continue SDU.  - Sputum culture 6/30, pending, TYTR - Continue broad-spectrum with vancomycin, zosyn. Vancomycin was continued despite negative MRSA PCR due to rapidly advancing infiltrates.  - Suspect COPD/bronchiectasis exacerbation, Tx as below. - Add chest PT and mucomyst for suspected mucous plugging.  - SLP evaluation to be repeated after some improvement of acute illness. Continue dysphagia 1. With evolution of infiltrates, do not suspect aspiration to be primary cause.   - Confirmed DNR, was seen by palliative care.   Acute metabolic encephalopathy: Due to sepsis - Monitor, improving, now with mostly delirium.   COPD/bronchiectasis with exacerbation:  - Added prednisone 20mg  6/30. Having some resultant HTN, so started hydralazine po and prn IV - Continue supplemental oxygen as needed - Use inhaler/nebulizer as needed  Parkinson disease - Home medications ordered. - SLP evaluation as above, plan to reevaluate once she advances functionally. - PT ordered, would like to mobilize the patient as part of prevention of deconditioning and delirium if able.  Hypothyroidism: TSH at goal, 1.325 - Continue synthroid.  HTN: Exacerbated by steroids.  - Prn hydralazine ordered   Dementia:  - Family hopeful for return to memory care unit. Will monitor progress.  - Continue exelon  History of osteoporosis on vitamin D supplements - Stable  Forehead lesion: Growing x2 months. Not infected - She's scheduled for biopsy. 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  DVT prophylaxis: Heparin Code Status: DNR confirmed Family Communication: Daughter at bedside Disposition Plan: Continue SDU monitoring in case of need for BiPAP.   Consultants:  Palliative care medicine  Procedures:   BiPAP 6/27  - 6/28  Antimicrobials:  Vancomycin 6/27 >>  Cefepime 6/27 - 6/28  Zosyn 6/28 >>    Subjective: No acute events overnight, required prn IV hydralazine. More alert this morning than previous. Says her breathing is better, daughter reports less wheezing.    Objective: Vitals:   01/15/17 0610 01/15/17 0800 01/15/17 1000 01/15/17 1106  BP:  (!) 162/70 (!) 178/92   Pulse:  (!) 102 (!) 105 (!) 108  Resp:  (!) 28 (!) 21 (!) 24  Temp:      TempSrc:      SpO2:  99% 98% 96%  Weight: 38.3 kg (84 lb 7 oz)     Height:        Intake/Output Summary (Last 24 hours) at 01/15/17 1111 Last data filed at 01/15/17 0500  Gross per 24 hour  Intake              370 ml  Output              350 ml  Net               20 ml   Filed Weights   01/10/17 1703 01/12/17 0252 01/15/17 0610  Weight: 39 kg (85 lb 15.7 oz) 40.1 kg (88 lb 6.5 oz) 38.3 kg (84 lb 7 oz)    Examination: General exam: Elderly, frail female resting quietly Respiratory system: Supraclavicular retractions still present but improved, tachypneic on 3L by [place]. Decreased left side > right side, scant end-expiratory wheezes improved. Cardiovascular system: Regular rate and rhythm. No JVD or edema Gastrointestinal system: Abdomen soft, non-tender, non-distended, with normoactive bowel sounds. No organomegaly or masses felt. Central nervous system: Alert, not oriented. Pleasantly confused without dysphasia. No focal deficits, diffuse weakness is moderate.   MSK: Significant kypho-scoliosis. Warm extremities without deformities Skin: No rashes. Forehead with ~2cm annular papule with squamous eschar without erythema or discharge. Psychiatry: Mood appears euthymic with restricted affect due to illness.   Data Reviewed: I have personally reviewed following labs and imaging studies  CBC:  Recent Labs Lab 01/10/17 1226 01/12/17 0315 01/13/17 0420 01/14/17 0340 01/15/17 0336  WBC 11.8* 10.7* 10.7* 7.6 8.4  NEUTROABS 10.0*  --   --    --   --   HGB 11.1* 8.9* 9.4* 8.9* 9.6*  HCT 34.0* 27.9* 29.6* 28.7* 30.4*  MCV 90.9 90.6 92.8 91.4 93.0  PLT 369 285 320 281 606   Basic Metabolic Panel:  Recent Labs Lab 01/10/17 1226 01/11/17 0328 01/12/17 0315 01/13/17 0420 01/14/17 0340 01/15/17 0336  NA 134* 134* 139 143 143  --   K 4.7 3.7 3.6 3.2* 3.8  --   CL 97* 103 107 109 110  --   CO2 26 26 25 28 27   --   GLUCOSE 156* 122* 95 122* 119*  --   BUN 21* 17 12 10 9   --   CREATININE 0.90 0.68 0.72 0.72 0.64 0.77  CALCIUM 9.0 7.8* 7.4* 7.4* 7.4*  --    GFR: Estimated Creatinine Clearance: 28.3 mL/min (by C-G formula based on SCr of 0.77 mg/dL). Liver Function Tests:  Recent Labs Lab 01/11/17 0328  AST 11*  ALT 11*  ALKPHOS 58  BILITOT 0.9  PROT 6.5  ALBUMIN 2.9*   CBG:  Recent Labs Lab 01/11/17 0543 01/12/17 0611 01/13/17 0731 01/14/17 0801  GLUCAP 112* 97 130* 103*   Thyroid Function Tests:  Recent Labs  01/13/17  0420  TSH 1.325   Urine analysis:    Component Value Date/Time   COLORURINE YELLOW 01/11/2017 [place] 01/11/2017 0643   LABSPEC 1.012 01/11/2017 0643   PHURINE 5.0 01/11/2017 0643   GLUCOSEU NEGATIVE 01/11/2017 0643   HGBUR NEGATIVE 01/11/2017 0643   BILIRUBINUR NEGATIVE 01/11/2017 0643   KETONESUR NEGATIVE 01/11/2017 0643   PROTEINUR NEGATIVE 01/11/2017 0643   UROBILINOGEN 0.2 04/09/2015 1355   NITRITE NEGATIVE 01/11/2017 0643   LEUKOCYTESUR NEGATIVE 01/11/2017 0643   Recent Results (from the past 240 hour(s))  Culture, blood (routine x 2)     Status: None (Preliminary result)   Collection Time: 01/10/17  1:17 PM  Result Value Ref Range Status   Specimen Description BLOOD RIGHT ANTECUBITAL  Final   Special Requests   Final    BOTTLES DRAWN AEROBIC AND ANAEROBIC Blood Culture adequate volume   Culture   Final    NO GROWTH 4 DAYS Performed at [facility], [place] [address] [place], [place] [address]    Report Status PENDING  Incomplete  Culture,  blood (routine x 2)     Status: None (Preliminary result)   Collection Time: 01/10/17  5:22 PM  Result Value Ref Range Status   Specimen Description BLOOD LEFT ARM  Final   Special Requests   Final    BOTTLES DRAWN AEROBIC AND ANAEROBIC Blood Culture adequate volume   Culture   Final    NO GROWTH 4 DAYS Performed at [facility], [address] [place], [place] [address]    Report Status PENDING  Incomplete  MRSA PCR Screening     Status: None   Collection Time: 01/10/17  8:00 PM  Result Value Ref Range Status   MRSA by PCR NEGATIVE NEGATIVE Final    Comment:        The GeneXpert MRSA Assay (FDA approved for NASAL specimens only), is one component of a comprehensive MRSA colonization surveillance program. It is not intended to diagnose MRSA infection nor to guide or monitor treatment for MRSA infections.   Culture, expectorated sputum-assessment     Status: None   Collection Time: 01/13/17 12:21 PM  Result Value Ref Range Status   Specimen Description SPUTUM  Final   Special Requests NONE  Final   Sputum evaluation THIS SPECIMEN IS ACCEPTABLE FOR SPUTUM CULTURE  Final   Report Status 01/13/2017 FINAL  Final  Culture, respiratory (NON-Expectorated)     Status: None (Preliminary result)   Collection Time: 01/13/17 12:25 PM  Result Value Ref Range Status   Specimen Description SPUTUM  Final   Special Requests NONE  Final   Gram Stain   Final    ABUNDANT WBC PRESENT, PREDOMINANTLY PMN RARE GRAM POSITIVE COCCI IN PAIRS RARE GRAM NEGATIVE RODS RARE GRAM POSITIVE RODS    Culture   Final    TOO YOUNG TO READ Performed at [facility], [place] [address], [place], [place] [address]    Report Status PENDING  Incomplete      Radiology Studies: Dg Chest Port 1 View  Result Date: 01/15/2017 CLINICAL DATA:  83 year old female respiratory failure. Subsequent encounter. EXAM: PORTABLE CHEST 1 VIEW COMPARISON:  01/13/2017 01/04/2016 2018 chest x-ray. FINDINGS: Almost  complete consolidation left thorax with sparring of the left lung apex. Mediastinal shift to left. This may reflect atelectasis from mucous plug or central obstructing lesion. Post obstructive infiltrate or pleural effusion not excluded. No pneumothorax. Calcified aorta. IMPRESSION: Almost complete consolidation  left thorax with sparring of the left lung apex. Mediastinal shift to left. This may reflect atelectasis from mucous plug or central obstructing lesion. Electronically Signed   By: Genia Del M.D.   On: 01/15/2017 06:49   Dg Chest Port 1 View  Result Date: 01/13/2017 CLINICAL DATA:  Shortness of breath today. Follow-up left pneumonia. EXAM: PORTABLE CHEST 1 VIEW COMPARISON:  01/10/2017 FINDINGS: Radiographic worsening with progressive opacification of the left hemithorax. Left lower lobe is now completely consolidated. Partial consolidation of the left upper lobe. Probable pleural fluid on the left. Pneumonia now developing in the right lung with some pleural fluid dependently and atelectasis/ infiltrate in the right lower lobe. IMPRESSION: Marked radiographic worsening as outlined above. Electronically Signed   By: Nelson Chimes M.D.   On: 01/13/2017 15:37    Scheduled Meds: . acetylcysteine  2 mL Nebulization TID  . budesonide  0.25 mg Nebulization QPC breakfast  . Carbidopa-Levodopa ER  1 capsule Oral QID  . hydrALAZINE  10 mg Oral Q8H  . levothyroxine  25 mcg Oral QAC breakfast  . predniSONE  20 mg Oral Q breakfast  . rasagiline  1 mg Oral Q1400  . rivastigmine  13.3 mg Transdermal Daily  . rotigotine  1 patch Transdermal Daily   Continuous Infusions: . piperacillin-tazobactam (ZOSYN)  IV 3.375 g (01/15/17 0926)  . vancomycin Stopped (01/15/17 0550)     LOS: 5 days   Time spent: 25 minutes.  Vance Gather, MD [facility] Pager [phone]  If 7PM-7AM, please contact night-coverage [email or web address] Password [email or web address] 01/15/2017, 11:11 AM

## 2017-01-16 DIAGNOSIS — A4152 Sepsis due to Pseudomonas: Principal | ICD-10-CM

## 2017-01-16 LAB — GLUCOSE, CAPILLARY
GLUCOSE-CAPILLARY: 80 mg/dL (ref 65–99)
GLUCOSE-CAPILLARY: 109 mg/dL — AB (ref 65–99)

## 2017-01-16 LAB — CREATININE, SERUM
Creatinine, Ser: 0.66 mg/dL (ref 0.44–1.00)
GFR calc Af Amer: >60 mL/min (ref >60)
GFR calc non Af Amer: >60 mL/min (ref >60)

## 2017-01-16 NOTE — Progress Notes (Signed)
Pharmacy Antibiotic Note  Shannon Dunn is a 83 y.o. female  With hx dementia presented to the ED from SNF on 01/10/2017 with c/o SOB.  Vancomycin and cefepime started on admission for PNA.  With concern for aspiration, to change cefepime to zosyn on 6/28.  Today, 01/16/2017 Day #8 antibiotics   afebrile  WBC WNL on 7/2 CBC  Pseudomonas in sputum cx (susc pending)  Vancomycin dose increased 7/2 after trough was low 7/1 (vancomycin stopped 7/1 but resumed by MD 7/2)  Plan:  Suggest stop vancomycin based on pseudomonas in sputum culture (d/w MD) - done  Continue Zosyn 3.375g IV Q8H infused over 4hrs pending pseudomonas susc.   ________________________  Height: 4\' 9"  (144.8 cm) Weight: 84 lb 7 oz (38.3 kg) IBW/kg (Calculated) : 38.6  Temp (24hrs), Avg:99 F (37.2 C), Min:98.8 F (37.1 C), Max:99.1 F (37.3 C)   Recent Labs Lab 01/10/17 1226 01/10/17 1325 01/10/17 1722  01/12/17 0315 01/13/17 0420 01/14/17 0340 01/14/17 1519 01/15/17 0336 01/16/17 0315  WBC 11.8*  --   --   --  10.7* 10.7* 7.6  --  8.4  --   CREATININE 0.90  --   --   < > 0.72 0.72 0.64  --  0.77 0.66  LATICACIDVEN  --  2.21* 0.7  --   --   --   --   --   --   --   VANCOTROUGH  --   --   --   --   --   --   --  7*  --   --   < > = values in this interval not displayed.  Estimated Creatinine Clearance: 28.3 mL/min (by C-G formula based on SCr of 0.66 mg/dL).    Allergies  Allergen Reactions  . Zithromax [Azithromycin] Diarrhea  . Iodine Other (See Comments)    Unknown, might be rash/hives  . Ivp Dye [Iodinated Diagnostic Agents] Other (See Comments)    Unknown rash/hives maybe  . Shellfish Allergy Other (See Comments)    Unknown rash/hives, scallops    Antimicrobials this admission:  6/27 vanc >>  6/27 cefepime >> 6/28 6/28 zosyn  >>  Dose adjustments this admission:  7/1 VT 7 mg/L on 500mg  IV q24h (before 5th dose), change to 500mg  q12h.  Microbiology results:  6/27 BCx x2: NGTD 6/27  MRSA PCR: neg 6/28 UA: neg 6/30 Sputum Cx: P. Aeruginosa   Thank you for allowing pharmacy to be a part of this patient's care.  Doreene Eland, PharmD, BCPS.   Pager: [phone] 01/16/2017 9:25 AM

## 2017-01-16 NOTE — Progress Notes (Signed)
  Speech Language Pathology Treatment: Dysphagia  Patient Details Name: Shannon Dunn MRN: [id number] DOB: April 19, 1924 Today's Date: 01/16/2017 Time: 1417-1500 SLP Time Calculation (min) (ACUTE ONLY): 43 min  Assessment / Plan / Recommendation Clinical Impression  extensive education with spouse/pt regarding risk factors associative with aspiraiton pneumonias, aspiration mitigation being functional goal, pt reported displeasure with puree diet with her stating "I just won't eat it", compensation strategies, education re: dysphagia and dementia - waxing/waning and gustatory changes with dementia.  Pt reported "how about a sandwich?", SLP provided her with soft [place] sandwich, nectar milk and nectar coffee= having her self feed.  Slow but effective mastication with no indications of aspiration.  Note pt has h/o silent aspiration but aspiration sensation is likely bolus dependent and she likely senses large amounts of aspiration.  Observed pt to take her pills provided by RN with puree/nectar-  excessive oral holding, delay transiting noted but use of nectar liquids facilitated clearance.     Recommend advance diet to dys3/nectar to help maximize pt protein intake to aid recovery.   Spopuse reports pt with poor intake prior to admit.  She fed herself slowly and masticated well, therefore she will use caution with po.    Will continue to follow for dysphagia management/education.   HPI HPI: Pt is a 83 y.o. female with PMH of COPD/bronchiectasis, Parkinson's disease, Paget bone disease, osteoporosis, history of colon cancer was brought to the ER by the family on 6/27 due to concerns of abnormal breath sounds and decreased mentation. Pt had a productive cough for 2 weeks with greenish mucus. Reportedly concerns for aspiration at baseline. CXR 6/27 showed LLL infiltrate with L side pleural effusion. Pt had MBS 11/08/15 showing prolonged mastication, brief oral holding, pharyngeal residuals; dysphagia 2/ thin  liquid diet/ meds crushed was recommended. Currently on dysphagia 1/ nectar thick liquid diet. Palliative following. Bedside swallow eval ordered.      SLP Plan  Continue with current plan of care       Recommendations  Diet recommendations: Dysphagia 3 (mechanical soft);Nectar-thick liquid Liquids provided via: Cup;Straw Medication Administration: Whole meds with puree Supervision: Full supervision/cueing for compensatory strategies Compensations: Minimize environmental distractions;Slow rate;Small sips/bites Postural Changes and/or Swallow Maneuvers: Seated upright 90 degrees                Oral Care Recommendations: Oral care BID;Oral care prior to ice chip/H20 Follow up Recommendations: Skilled Nursing facility Plan: Continue with current plan of care       [place], [place], [place] SLP [phone]

## 2017-01-16 NOTE — Progress Notes (Signed)
Physical Therapy Treatment Patient Details Name: Shannon Dunn MRN: [id number] DOB: 1923/08/10 Today's Date: 01/16/2017    History of Present Illness Shannon Dunn a 83 y.o.femalewith medical history significant of COPD/bronchiectasis, ORIF hip, Parkinson's disease, Paget bone disease, osteoporosis, history of colon cancer who was brought to the ER from [facility] by the family due to concerns of abnormal breath sounds and decreased mentation. History is per family due to patient's mental status.    PT Comments    The patient was very lethargic, holding medications and drink in mouth. Requires  2 total assist to move to recliner. Daughter present. Was ambulatory with assist PTA. Now unable to ambulate.    Follow Up Recommendations  SNF     Equipment Recommendations  None recommended by PT    Recommendations for Other Services       Precautions / Restrictions Precautions Precautions: Fall Precaution Comments: incontinence    Mobility  Bed Mobility Overal bed mobility: Needs Assistance Bed Mobility: Supine to Sit     Supine to sit: Total assist;+2 for physical assistance;+2 for safety/equipment     General bed mobility comments: multi-modal cues for attention to task ; +2 for trunk control/elevation and lines  Transfers Overall transfer level: Needs assistance   Transfers: Sit to/from Stand;Squat Pivot Transfers     Squat pivot transfers: Total assist;+2 physical assistance;+2 safety/equipment     General transfer comment: patient unable to assist with transfer, no weight through legs, bear hug technique with 2 total assist to recliner.  Ambulation/Gait                 Stairs            Wheelchair Mobility    Modified Rankin (Stroke Patients Only)       Balance Overall balance assessment: Needs assistance;History of Falls   Sitting balance-Leahy Scale: Zero Sitting balance - Comments: listing to left                                    Cognition Arousal/Alertness: Lethargic Behavior During Therapy: Anxious Overall Cognitive Status: History of cognitive impairments - at baseline                                        Exercises      General Comments        Pertinent Vitals/Pain Pain Assessment: Faces Faces Pain Scale: Hurts even more Pain Location: legs    Home Living                      Prior Function            PT Goals (current goals can now be found in the care plan section) Progress towards PT goals: Not progressing toward goals - comment (very lethargic)    Frequency    Min 2X/week      PT Plan Current plan remains appropriate;Frequency needs to be updated    Co-evaluation              AM-PAC PT "6 Clicks" Daily Activity  Outcome Measure  Difficulty turning over in bed (including adjusting bedclothes, sheets and blankets)?: Total Difficulty moving from lying on back to sitting on the side of the bed? : Total Difficulty sitting down on and standing up  from a chair with arms (e.g., wheelchair, bedside commode, etc,.)?: Total Help needed moving to and from a bed to chair (including a wheelchair)?: Total Help needed walking in hospital room?: Total Help needed climbing 3-5 steps with a railing? : Total 6 Click Score: 6    End of Session   Activity Tolerance: Patient limited by fatigue;Patient limited by lethargy Patient left: in chair;with call bell/phone within reach;with chair alarm set Nurse Communication: Mobility status PT Visit Diagnosis: Other abnormalities of gait and mobility (R26.89)     Time: 2094-7096 PT Time Calculation (min) (ACUTE ONLY): 28 min  Charges:  $Therapeutic Activity: 23-37 mins                    G CodesTresa Endo PT [phone] here}   Claretha Cooper 01/16/2017, 1:56 PM

## 2017-01-16 NOTE — Progress Notes (Signed)
CSW assisting with d/c planning. Met with pt / oldest son at bedside. Son is aware SNF placement may be needed at d/c if pt doesn't return to baseline. Pt remains in SDU. DC date undetermined. CSW will continue to follow to assist with d/c planning needs.  Werner Lean LCSW [phone]

## 2017-01-16 NOTE — Progress Notes (Signed)
PROGRESS NOTE  [name]  TDH:[id number] DOB: July 04, 1924 DOA: 01/10/2017 PCP: Renata Caprice, DO   Brief Narrative:  Shannon Dunn is a 83 y.o. female with medical history significant of COPD/bronchiectasis, Parkinson's disease, Paget bone disease, osteoporosis, history of colon cancer who was brought to the ER from [facility] by the family due to concerns of abnormal breath sounds and decreased mentation. History is per family due to patient's mental status.  Family reports a cough productive of green sputum for 2 weeks and subjective fever. At baseline she is able to recognize family members and interactive through short conversations but there are periods of sundowning, but developed drowsiness, prompting presentation to ED. In the ER patient was noted to be febrile with temperature of 101F. lactate of 2.2, elevated heart rate, tachypnea and signs of LLL pneumonia on the chest x-ray. She was placed on BiPAP for resp support with improvement in ABG, and started on vancomycin and cefepime. She continued on BiPAP overnight and weaned to nasal cannula 6/28. DNR status has been confirmed by palliative care consultant and the patient's mental status improved, prompting transfer to the floor 6/29. The patient developed increased work of breathing and wheezing 6/30. Repeat CXR showed interval white-out of left lung, and she was returned to SDU but has not required BiPAP. Prednisone has been given for COPD/bronchiectasis exacerbation in addition to broad spectrum antibiotics.   Assessment & Plan: Active Problems:   Bronchiectasis without acute exacerbation (HCC)   Dementia   Parkinson's disease ([place])   HCAP (healthcare-associated pneumonia)   Acute respiratory failure with hypoxia (HCC)   Hypothyroidism   Malnutrition of moderate degree  Acute hypoxemic respiratory failure and sepsis secondary to LLL HCAP due to Pseudomonas aeruginosa: Initial improvement followed by interval worsening with  significant left lung opacification extending to right on 6/30. Transferred back to SDU 6/30 though has not required NIPPV. - BiPAP prn, continue SDU. Repeat CXR in AM. If stable, improving would consider transfer to floor.  - Sputum culture 6/30 grew moderate P. aeruginosa on 7/2. Continue zosyn. Will DC vancomycin and continue monitoring, MRSA PCR neg. Double coverage for Pseudomonas doesn't seem necessary given clinical response, but will continue monitoring.  - Suspect COPD/bronchiectasis exacerbation, Tx as below. - Added chest PT and mucomyst for suspected mucous plugging.  - SLP evaluation to be repeated after some improvement of acute illness. Continue dysphagia 1. With evolution of infiltrates, do not suspect aspiration to be primary cause.   - Confirmed DNR, was seen by palliative care.   Acute metabolic encephalopathy: Due to sepsis - Monitor, improving, now with mostly delirium.   COPD/bronchiectasis with exacerbation:  - Added prednisone 20mg  6/30 (planned last dose is 7/4, prolong if necessary). - Continue supplemental oxygen as needed - Use nebulizer as needed  Parkinson's disease: Chronic, no evidence of flare/crisis. - Home medications ordered. - SLP evaluation as above, plan to reevaluate once she advances functionally. - PT ordered, would like to mobilize the patient as part of prevention of deconditioning and delirium if able.  Hypothyroidism: TSH at goal, 1.325 - Continue synthroid.  HTN: Exacerbated by steroids. Confirmatory manual readings are less elevated, so will no titrate up on medications at this time.  - Started low dose po hydralazine during steroid burst - Prn hydralazine ordered   Dementia:  - Family hopeful for return to memory care unit. Will monitor progress.  - Continue exelon  History of osteoporosis on vitamin D supplements - Stable  Forehead lesion:  Growing x2 months. Not infected - She's scheduled for biopsy. Encouraged to keep this  appointment as this is very likely malignant in the setting of prior cutaneous malignancies.  DVT prophylaxis: Heparin Code Status: DNR confirmed Family Communication: Daughter at bedside Disposition Plan: Continue SDU monitoring in case of need for BiPAP. Possible transfer to floor in 24-48 hrs.   Consultants:   Palliative care medicine  Procedures:   BiPAP 6/27 - 6/28  Antimicrobials:  Vancomycin 6/27 - 7/3  Cefepime 6/27 - 6/28  Zosyn 6/28 >>   Subjective: Slept from 3am onward, no respiratory distress. Sputum is becoming more white, less green. Wheezing less. Denies current dyspnea or chest pain   Objective: Vitals:   01/16/17 0349 01/16/17 0400 01/16/17 0600 01/16/17 0746  BP:  (!) 160/61 (!) 145/49   Pulse:   91   Resp:  (!) 22 (!) 32   Temp: 98.8 F (37.1 C)     TempSrc: Oral     SpO2:   100% 100%  Weight:      Height:        Intake/Output Summary (Last 24 hours) at 01/16/17 0928 Last data filed at 01/16/17 0434  Gross per 24 hour  Intake              350 ml  Output                0 ml  Net              350 ml   Filed Weights   01/10/17 1703 01/12/17 0252 01/15/17 0610  Weight: 39 kg (85 lb 15.7 oz) 40.1 kg (88 lb 6.5 oz) 38.3 kg (84 lb 7 oz)    Examination: General exam: Elderly, frail female resting quietly Respiratory system: Supraclavicular retractions still present but improved, tachypneic on 3L by [place]. Decreased left side > right side, scant end-expiratory wheezes improved. Cardiovascular system: Regular rate and rhythm. No JVD or edema Gastrointestinal system: Abdomen soft, non-tender, non-distended, with normoactive bowel sounds. No organomegaly or masses felt. Central nervous system: Alert, not oriented. Pleasantly confused without dysphasia. No focal deficits, diffuse weakness is moderate.   MSK: Significant kypho-scoliosis. Warm extremities without deformities Skin: No rashes. Forehead with ~2cm annular papule with squamous eschar without  erythema or discharge. Psychiatry: Mood appears euthymic with restricted affect due to illness.   Data Reviewed: I have personally reviewed following labs and imaging studies  CBC:  Recent Labs Lab 01/10/17 1226 01/12/17 0315 01/13/17 0420 01/14/17 0340 01/15/17 0336  WBC 11.8* 10.7* 10.7* 7.6 8.4  NEUTROABS 10.0*  --   --   --   --   HGB 11.1* 8.9* 9.4* 8.9* 9.6*  HCT 34.0* 27.9* 29.6* 28.7* 30.4*  MCV 90.9 90.6 92.8 91.4 93.0  PLT 369 285 320 281 956   Basic Metabolic Panel:  Recent Labs Lab 01/10/17 1226 01/11/17 0328 01/12/17 0315 01/13/17 0420 01/14/17 0340 01/15/17 0336 01/16/17 0315  NA 134* 134* 139 143 143  --   --   K 4.7 3.7 3.6 3.2* 3.8  --   --   CL 97* 103 107 109 110  --   --   CO2 26 26 25 28 27   --   --   GLUCOSE 156* 122* 95 122* 119*  --   --   BUN 21* 17 12 10 9   --   --   CREATININE 0.90 0.68 0.72 0.72 0.64 0.77 0.66  CALCIUM 9.0 7.8* 7.4* 7.4* 7.4*  --   --  GFR: Estimated Creatinine Clearance: 28.3 mL/min (by C-G formula based on SCr of 0.66 mg/dL). Liver Function Tests:  Recent Labs Lab 01/11/17 0328  AST 11*  ALT 11*  ALKPHOS 58  BILITOT 0.9  PROT 6.5  ALBUMIN 2.9*   CBG:  Recent Labs Lab 01/12/17 0611 01/13/17 0731 01/14/17 0801 01/15/17 1223 01/16/17 0907  GLUCAP 97 130* 103* 110* 80   Thyroid Function Tests: No results for input(s): TSH, T4TOTAL, FREET4, T3FREE, THYROIDAB in the last 72 hours. Urine analysis:    Component Value Date/Time   COLORURINE YELLOW 01/11/2017 [place] 01/11/2017 0643   LABSPEC 1.012 01/11/2017 0643   PHURINE 5.0 01/11/2017 0643   GLUCOSEU NEGATIVE 01/11/2017 0643   HGBUR NEGATIVE 01/11/2017 0643   BILIRUBINUR NEGATIVE 01/11/2017 0643   KETONESUR NEGATIVE 01/11/2017 0643   PROTEINUR NEGATIVE 01/11/2017 0643   UROBILINOGEN 0.2 04/09/2015 1355   NITRITE NEGATIVE 01/11/2017 0643   LEUKOCYTESUR NEGATIVE 01/11/2017 0643   Recent Results (from the past 240 hour(s))    Culture, blood (routine x 2)     Status: None   Collection Time: 01/10/17  1:17 PM  Result Value Ref Range Status   Specimen Description BLOOD RIGHT ANTECUBITAL  Final   Special Requests   Final    BOTTLES DRAWN AEROBIC AND ANAEROBIC Blood Culture adequate volume   Culture   Final    NO GROWTH 5 DAYS Performed at [facility], [place] [address], [place], [place] [address]    Report Status 01/15/2017 FINAL  Final  Culture, blood (routine x 2)     Status: None   Collection Time: 01/10/17  5:22 PM  Result Value Ref Range Status   Specimen Description BLOOD LEFT ARM  Final   Special Requests   Final    BOTTLES DRAWN AEROBIC AND ANAEROBIC Blood Culture adequate volume   Culture   Final    NO GROWTH 5 DAYS Performed at [facility], [place] [address], [place], [place] [address]    Report Status 01/15/2017 FINAL  Final  MRSA PCR Screening     Status: None   Collection Time: 01/10/17  8:00 PM  Result Value Ref Range Status   MRSA by PCR NEGATIVE NEGATIVE Final    Comment:        The GeneXpert MRSA Assay (FDA approved for NASAL specimens only), is one component of a comprehensive MRSA colonization surveillance program. It is not intended to diagnose MRSA infection nor to guide or monitor treatment for MRSA infections.   Culture, expectorated sputum-assessment     Status: None   Collection Time: 01/13/17 12:21 PM  Result Value Ref Range Status   Specimen Description SPUTUM  Final   Special Requests NONE  Final   Sputum evaluation THIS SPECIMEN IS ACCEPTABLE FOR SPUTUM CULTURE  Final   Report Status 01/13/2017 FINAL  Final  Culture, respiratory (NON-Expectorated)     Status: None (Preliminary result)   Collection Time: 01/13/17 12:25 PM  Result Value Ref Range Status   Specimen Description SPUTUM  Final   Special Requests NONE  Final   Gram Stain   Final    ABUNDANT WBC PRESENT, PREDOMINANTLY PMN RARE GRAM POSITIVE COCCI IN PAIRS RARE GRAM NEGATIVE RODS RARE GRAM  POSITIVE RODS Performed at [facility], [address] [place], [place] [address]    Culture MODERATE PSEUDOMONAS AERUGINOSA  Final   Report Status PENDING  Incomplete      Radiology Studies: Dg Chest  Port 1 View  Result Date: 01/15/2017 CLINICAL DATA:  83 year old female respiratory failure. Subsequent encounter. EXAM: PORTABLE CHEST 1 VIEW COMPARISON:  01/13/2017 01/04/2016 2018 chest x-ray. FINDINGS: Almost complete consolidation left thorax with sparring of the left lung apex. Mediastinal shift to left. This may reflect atelectasis from mucous plug or central obstructing lesion. Post obstructive infiltrate or pleural effusion not excluded. No pneumothorax. Calcified aorta. IMPRESSION: Almost complete consolidation left thorax with sparring of the left lung apex. Mediastinal shift to left. This may reflect atelectasis from mucous plug or central obstructing lesion. Electronically Signed   By: Genia Del M.D.   On: 01/15/2017 06:49    Scheduled Meds: . acetylcysteine  2 mL Nebulization TID  . budesonide  0.25 mg Nebulization QPC breakfast  . Carbidopa-Levodopa ER  1 capsule Oral QID  . hydrALAZINE  10 mg Oral Q8H  . levothyroxine  25 mcg Oral QAC breakfast  . predniSONE  20 mg Oral Q breakfast  . rasagiline  1 mg Oral Q1400  . rivastigmine  13.3 mg Transdermal Daily  . rotigotine  1 patch Transdermal Daily   Continuous Infusions: . piperacillin-tazobactam (ZOSYN)  IV 3.375 g (01/16/17 0307)  . vancomycin Stopped (01/16/17 0534)     LOS: 6 days   Time spent: 25 minutes.  Vance Gather, MD [facility] Pager [phone]  If 7PM-7AM, please contact night-coverage [email or web address] Password [email or web address] 01/16/2017, 9:28 AM

## 2017-01-17 DIAGNOSIS — F0280 Dementia in other diseases classified elsewhere without behavioral disturbance: Secondary | ICD-10-CM

## 2017-01-17 DIAGNOSIS — E039 Hypothyroidism, unspecified: Secondary | ICD-10-CM

## 2017-01-17 DIAGNOSIS — J479 Bronchiectasis, uncomplicated: Secondary | ICD-10-CM

## 2017-01-17 LAB — CULTURE, RESPIRATORY

## 2017-01-17 LAB — CBC
HCT: 30.2 % — ABNORMAL LOW (ref 36.0–46.0)
Hemoglobin: 9.5 g/dL — ABNORMAL LOW (ref 12.0–15.0)
MCH: 28.8 pg (ref 26.0–34.0)
MCHC: 31.5 g/dL (ref 30.0–36.0)
MCV: 91.5 fL (ref 78.0–100.0)
PLATELETS: 359 10*3/uL (ref 150–400)
RBC: 3.3 MIL/uL — AB (ref 3.87–5.11)
RDW: 16.2 % — ABNORMAL HIGH (ref 11.5–15.5)
WBC: 8.3 10*3/uL (ref 4.0–10.5)

## 2017-01-17 LAB — BASIC METABOLIC PANEL
ANION GAP: 6 (ref 5–15)
BUN: 16 mg/dL (ref 6–20)
CALCIUM: 7.8 mg/dL — AB (ref 8.9–10.3)
CO2: 32 mmol/L (ref 22–32)
Chloride: 99 mmol/L — ABNORMAL LOW (ref 101–111)
Creatinine, Ser: 0.76 mg/dL (ref 0.44–1.00)
Glucose, Bld: 88 mg/dL (ref 65–99)
POTASSIUM: 3.4 mmol/L — AB (ref 3.5–5.1)
SODIUM: 137 mmol/L (ref 135–145)

## 2017-01-17 LAB — CULTURE, RESPIRATORY W GRAM STAIN

## 2017-01-17 LAB — GLUCOSE, CAPILLARY: Glucose-Capillary: 94 mg/dL (ref 65–99)

## 2017-01-17 MED ORDER — POTASSIUM CHLORIDE CRYS ER 20 MEQ PO TBCR
40.0000 meq | EXTENDED_RELEASE_TABLET | Freq: Once | ORAL | Status: AC
  Administered 2017-01-17: 40 meq via ORAL
  Filled 2017-01-17: qty 2

## 2017-01-17 NOTE — Progress Notes (Signed)
CSW has completed FL-2 and PASAR. Pt has been faxed out to [facility] and is awaiting response. CSW will continue to follow up with discharge needs as present.     Virgie Dad Bobbie Valletta, MSW, [facility] Clinical Social Worker [phone]

## 2017-01-17 NOTE — Progress Notes (Signed)
CSW spoke with patient daughter Santiago Glad, she is agreeable for patient to go to rehab before returning to [place] and provided with SNF choice. CSW will continue to assist with placement.   Kathrin Greathouse, Latanya Presser, MSW Clinical Social Worker 5E and [place] [phone] 01/17/2017  3:08 PM

## 2017-01-17 NOTE — Progress Notes (Signed)
  Speech Language Pathology Treatment:    Patient Details Name: Shannon Dunn MRN: [id number] DOB: Nov 20, 1923 Today's Date: 01/17/2017 Time: 4098-1191 SLP Time Calculation (min) (ACUTE ONLY): 10 min  Assessment / Plan / Recommendation Clinical Impression  Pt sitting upright in bed, reports much improved intake with diet advanced.  She stated "If it looks good and tastes good, I'll eat it".  Pt observed consuming vanilla Ensure liquid via straw - no indication of airway compromise and clear voice throughout.  She is not conducting multiple swallows as previously recommended but appears to tolerate po.   Regarding Ensure, pt states "You can drink one of these in five minutes, I don't have an exact way to measure it".  Recommend continue dys3/nectar diet with precautions and allowing free water between meals.  Pt is afebrile, WBC slightly increased to 8.3 from 7.6.   Will follow up briefly for family education.     HPI HPI: Pt is a 83 y.o. female with PMH of COPD/bronchiectasis, Parkinson's disease, Paget bone disease, osteoporosis, history of colon cancer was brought to the ER by the family on 6/27 due to concerns of abnormal breath sounds and decreased mentation. Pt had a productive cough for 2 weeks with greenish mucus. Reportedly concerns for aspiration at baseline. CXR 6/27 showed LLL infiltrate with L side pleural effusion. Pt had MBS 11/08/15 showing prolonged mastication, brief oral holding, pharyngeal residuals; dysphagia 2/ thin liquid diet/ meds crushed was recommended. Currently on dysphagia 1/ nectar thick liquid diet. Palliative following. Bedside swallow eval ordered.      SLP Plan  Continue with current plan of care       Recommendations  Diet recommendations: Dysphagia 3 (mechanical soft);Nectar-thick liquid Liquids provided via: Cup;Straw Medication Administration: Whole meds with puree Supervision: Full supervision/cueing for compensatory strategies Compensations: Minimize  environmental distractions;Slow rate;Small sips/bites Postural Changes and/or Swallow Maneuvers: Seated upright 90 degrees;Upright 30-60 min after meal                Oral Care Recommendations: Oral care BID;Oral care prior to ice chip/H20 Follow up Recommendations: Skilled Nursing facility SLP Visit Diagnosis: Dysphagia, oropharyngeal phase (R13.12) Plan: Continue with current plan of care       GO               Luanna Salk, [facility] SLP [phone]  Macario Golds 01/17/2017, 11:42 AM

## 2017-01-17 NOTE — Progress Notes (Addendum)
Patient ID: Shannon Dunn, female   DOB: 14-Dec-1923, 83 y.o.   MRN: [id number]  PROGRESS NOTE    SAMANTHAJO Dunn  WUJ:[id number] DOB: 12-29-1923 DOA: 01/10/2017  PCP: Renata Caprice, DO   Brief Narrative:  83 year old female with history of COPD and bronchiectasis, Parkinson's, Paget's bone disease and colon ca who presented to ED from memory care unit due to cough productive of greenish sputum, fevers and decreased mentation. Patient was found to have pneumonia  And was started on empiric abx while awaiting culture results.  Assessment & Plan:   Active Problems: Bronchiectasis / COPD  withacute exacerbation (HCC) - Continue current nebulizer treatment - Chest physiotherapy  - Oxygen support via [place] to keep O2 sats above 90%    Acute respiratory failure with hypoxia (HCC) / HCAP (healthcare-associated pneumonia) - Required BiPAP and monitoring in SDU  - Repeat CXR stable - Sputum cx grew moderate P. Aeruginosa on 7/2 - Stable resp status  - Continue zosyn   Parkinson's disease ([place]) - To SNF on discharge   Hypothyroidism  - Continue synthroid   Hypokalemia - Due to nebulizer treatment - Supplemented   Essential hypertension - Continue hydralazine   Malnutrition of moderate degree - In the context of chronic illness - Diet as tolerated  Dementia - No significant changes in mental status  Forehead lesion - ?carcinoma - Outpt follow up  DVT prophylaxis: SCD's bilaterally  Code Status: DNR/DNI Family Communication: no family at the bedside Disposition Plan: to SNF in next 24-48 hours    Consultants:   PCT  Procedures:   BiPAP  Antimicrobials:   Vanco 6/27 - 7/3  Cefepime 6/27 - 6/28  Zosyn 6/28 -->    Subjective: No overnight events.   Objective: Vitals:   01/17/17 0400 01/17/17 0500 01/17/17 0600 01/17/17 0622  BP: (!) 125/40  (!) 126/52 (!) 126/52  Pulse: 89 99 90   Resp: 20 (!) 22 (!) 21   Temp:      TempSrc:      SpO2: 91% (!) 73% 97%     Weight:      Height:        Intake/Output Summary (Last 24 hours) at 01/17/17 0728 Last data filed at 01/17/17 0000  Gross per 24 hour  Intake              410 ml  Output                0 ml  Net              410 ml   Filed Weights   01/10/17 1703 01/12/17 0252 01/15/17 0610  Weight: 39 kg (85 lb 15.7 oz) 40.1 kg (88 lb 6.5 oz) 38.3 kg (84 lb 7 oz)    Examination:  General exam: Appears calm and comfortable  Respiratory system: Diminished breath sounds, no wheezing Cardiovascular system: S1 & S2 heard, RRR. No edema Gastrointestinal system: Abdomen is nondistended, soft and nontender. No organomegaly or masses felt. Normal bowel sounds heard. Central nervous system: No focal neurological deficits. Disoriented (oriented to person only) Extremities: Symmetric 5 x 5 power. No edema Skin: ecchymoses on upper extremities, forehead looks like a small bump ?skin carcinoma Psychiatry: Normal mood and behavior   Data Reviewed: I have personally reviewed following labs and imaging studies  CBC:  Recent Labs Lab 01/10/17 1226 01/12/17 0315 01/13/17 0420 01/14/17 0340 01/15/17 0336 01/17/17 0249  WBC 11.8* 10.7* 10.7* 7.6 8.4 8.3  NEUTROABS 10.0*  --   --   --   --   --   HGB 11.1* 8.9* 9.4* 8.9* 9.6* 9.5*  HCT 34.0* 27.9* 29.6* 28.7* 30.4* 30.2*  MCV 90.9 90.6 92.8 91.4 93.0 91.5  PLT 369 285 320 281 339 655   Basic Metabolic Panel:  Recent Labs Lab 01/11/17 0328 01/12/17 0315 01/13/17 0420 01/14/17 0340 01/15/17 0336 01/16/17 0315 01/17/17 0249  NA 134* 139 143 143  --   --  137  K 3.7 3.6 3.2* 3.8  --   --  3.4*  CL 103 107 109 110  --   --  99*  CO2 26 25 28 27   --   --  32  GLUCOSE 122* 95 122* 119*  --   --  88  BUN 17 12 10 9   --   --  16  CREATININE 0.68 0.72 0.72 0.64 0.77 0.66 0.76  CALCIUM 7.8* 7.4* 7.4* 7.4*  --   --  7.8*   GFR: Estimated Creatinine Clearance: 28.3 mL/min (by C-G formula based on SCr of 0.76 mg/dL). Liver Function  Tests:  Recent Labs Lab 01/11/17 0328  AST 11*  ALT 11*  ALKPHOS 58  BILITOT 0.9  PROT 6.5  ALBUMIN 2.9*   No results for input(s): LIPASE, AMYLASE in the last 168 hours. No results for input(s): AMMONIA in the last 168 hours. Coagulation Profile: No results for input(s): INR, PROTIME in the last 168 hours. Cardiac Enzymes: No results for input(s): CKTOTAL, CKMB, CKMBINDEX, TROPONINI in the last 168 hours. BNP (last 3 results) No results for input(s): PROBNP in the last 8760 hours. HbA1C: No results for input(s): HGBA1C in the last 72 hours. CBG:  Recent Labs Lab 01/13/17 0731 01/14/17 0801 01/15/17 1223 01/16/17 0907 01/16/17 1252  GLUCAP 130* 103* 110* 80 109*   Lipid Profile: No results for input(s): CHOL, HDL, LDLCALC, TRIG, CHOLHDL, LDLDIRECT in the last 72 hours. Thyroid Function Tests: No results for input(s): TSH, T4TOTAL, FREET4, T3FREE, THYROIDAB in the last 72 hours. Anemia Panel: No results for input(s): VITAMINB12, FOLATE, FERRITIN, TIBC, IRON, RETICCTPCT in the last 72 hours. Urine analysis:    Component Value Date/Time   COLORURINE YELLOW 01/11/2017 [place] 01/11/2017 0643   LABSPEC 1.012 01/11/2017 0643   PHURINE 5.0 01/11/2017 0643   GLUCOSEU NEGATIVE 01/11/2017 0643   HGBUR NEGATIVE 01/11/2017 0643   BILIRUBINUR NEGATIVE 01/11/2017 0643   KETONESUR NEGATIVE 01/11/2017 0643   PROTEINUR NEGATIVE 01/11/2017 0643   UROBILINOGEN 0.2 04/09/2015 1355   NITRITE NEGATIVE 01/11/2017 0643   LEUKOCYTESUR NEGATIVE 01/11/2017 0643   Sepsis Labs: @LABRCNTIP (procalcitonin:4,lacticidven:4)   ) Recent Results (from the past 240 hour(s))  Culture, blood (routine x 2)     Status: None   Collection Time: 01/10/17  1:17 PM  Result Value Ref Range Status   Specimen Description BLOOD RIGHT ANTECUBITAL  Final   Special Requests   Final    BOTTLES DRAWN AEROBIC AND ANAEROBIC Blood Culture adequate volume   Culture   Final    NO GROWTH 5  DAYS Performed at [facility], [place] [address], [place], [place] [address]    Report Status 01/15/2017 FINAL  Final  Culture, blood (routine x 2)     Status: None   Collection Time: 01/10/17  5:22 PM  Result Value Ref Range Status   Specimen Description BLOOD LEFT ARM  Final   Special Requests   Final    BOTTLES DRAWN  AEROBIC AND ANAEROBIC Blood Culture adequate volume   Culture   Final    NO GROWTH 5 DAYS Performed at [facility], [place] [address], [place], [place] [address]    Report Status 01/15/2017 FINAL  Final  MRSA PCR Screening     Status: None   Collection Time: 01/10/17  8:00 PM  Result Value Ref Range Status   MRSA by PCR NEGATIVE NEGATIVE Final    Comment:        The GeneXpert MRSA Assay (FDA approved for NASAL specimens only), is one component of a comprehensive MRSA colonization surveillance program. It is not intended to diagnose MRSA infection nor to guide or monitor treatment for MRSA infections.   Culture, expectorated sputum-assessment     Status: None   Collection Time: 01/13/17 12:21 PM  Result Value Ref Range Status   Specimen Description SPUTUM  Final   Special Requests NONE  Final   Sputum evaluation THIS SPECIMEN IS ACCEPTABLE FOR SPUTUM CULTURE  Final   Report Status 01/13/2017 FINAL  Final  Culture, respiratory (NON-Expectorated)     Status: None (Preliminary result)   Collection Time: 01/13/17 12:25 PM  Result Value Ref Range Status   Specimen Description SPUTUM  Final   Special Requests NONE  Final   Gram Stain   Final    ABUNDANT WBC PRESENT, PREDOMINANTLY PMN RARE GRAM POSITIVE COCCI IN PAIRS RARE GRAM NEGATIVE RODS RARE GRAM POSITIVE RODS    Culture   Final    MODERATE PSEUDOMONAS AERUGINOSA SUSCEPTIBILITIES TO FOLLOW Performed at [facility], [place] [address], [place], [place] [address]    Report Status PENDING  Incomplete      Radiology Studies: Dg Chest Port 1 View  Result Date: 01/15/2017 CLINICAL DATA:   83 year old female respiratory failure. Subsequent encounter. EXAM: PORTABLE CHEST 1 VIEW COMPARISON:  01/13/2017 01/04/2016 2018 chest x-ray. FINDINGS: Almost complete consolidation left thorax with sparring of the left lung apex. Mediastinal shift to left. This may reflect atelectasis from mucous plug or central obstructing lesion. Post obstructive infiltrate or pleural effusion not excluded. No pneumothorax. Calcified aorta. IMPRESSION: Almost complete consolidation left thorax with sparring of the left lung apex. Mediastinal shift to left. This may reflect atelectasis from mucous plug or central obstructing lesion. Electronically Signed   By: Genia Del M.D.   On: 01/15/2017 06:49   Dg Chest Port 1 View  Result Date: 01/13/2017 CLINICAL DATA:  Shortness of breath today. Follow-up left pneumonia. EXAM: PORTABLE CHEST 1 VIEW COMPARISON:  01/10/2017 FINDINGS: Radiographic worsening with progressive opacification of the left hemithorax. Left lower lobe is now completely consolidated. Partial consolidation of the left upper lobe. Probable pleural fluid on the left. Pneumonia now developing in the right lung with some pleural fluid dependently and atelectasis/ infiltrate in the right lower lobe. IMPRESSION: Marked radiographic worsening as outlined above. Electronically Signed   By: Nelson Chimes M.D.   On: 01/13/2017 15:37        Scheduled Meds: . acetylcysteine  2 mL Nebulization TID  . budesonide  0.25 mg Nebulization QPC breakfast  . Carbidopa-Levodopa ER  1 capsule Oral QID  . hydrALAZINE  10 mg Oral Q8H  . levothyroxine  25 mcg Oral QAC breakfast  . rasagiline  1 mg Oral Q1400  . rivastigmine  13.3 mg Transdermal Daily  . rotigotine  1 patch Transdermal Daily   Continuous Infusions: . piperacillin-tazobactam (ZOSYN)  IV Stopped (01/17/17 0525)     LOS: 7 days  Time spent: 25 minutes  Greater than 50% of the time spent on counseling and coordinating the care.   Leisa Lenz,  MD [facility] Pager [phone]  If 7PM-7AM, please contact night-coverage [email or web address] Password [email or web address] 01/17/2017, 7:28 AM

## 2017-01-17 NOTE — NC FL2 (Signed)
[place] LEVEL OF CARE SCREENING TOOL     IDENTIFICATION  Patient Name: Shannon Dunn Birthdate: 10/05/1923 Sex: female Admission Date (Current Location): 01/10/2017  [facility] and [place] Number:  Herbalist and Address:  [facility],  [place], [place]      Provider Number: [id number]  Attending Physician Name and Address:  Robbie Lis, MD  Relative Name and Phone Number:       Current Level of Care: Hospital Recommended Level of Care: [place] Prior Approval Number:    Date Approved/Denied:   PASRR Number:   [id number]  Discharge Plan: SNF    Current Diagnoses: Patient Active Problem List   Diagnosis Date Noted  . Malnutrition of moderate degree 01/11/2017  . COPD (chronic obstructive pulmonary disease) ([place]) 12/03/2015  . Ecchymosis 11/27/2015  . Hypothyroidism 11/14/2015  . Protein-calorie malnutrition, severe 11/05/2015  . Femur fracture ([place]) 11/04/2015  . Hip fracture, left ([place]) 11/04/2015  . Chronic respiratory failure with hypoxia ([place]) 09/13/2015  . Physical deconditioning 07/19/2015  . Acute respiratory failure with hypoxia ([place]) 07/18/2015  . HCAP (healthcare-associated pneumonia) 07/16/2015  . Obstructive bronchiectasis ([place]) 04/29/2015  . H/O malignant neoplasm of skin 12/24/2014  . Cancer ([place]) 04/16/2014  . Mammary Pagets disease ([place]) 04/16/2014  . Cancer of nipple and areola of female breast ([place]) 04/16/2014  . Bronchiectasis without acute exacerbation ([place]) 03/24/2013  . Pernicious anemia   . Dementia   . Vitamin D deficiency   . Osteoporosis   . Parkinson's disease ([place])   . Hernia of anterior abdominal wall 03/02/2013  . H/O nutritional disorder 01/15/2013  . Personal history of nutritional deficiency 01/15/2013    Orientation RESPIRATION BLADDER Height & Weight     Self  O2 (2 L) Incontinent Weight: 84 lb 7 oz (38.3 kg) Height:  4\' 9"  (144.8 cm)   BEHAVIORAL SYMPTOMS/MOOD NEUROLOGICAL BOWEL NUTRITION STATUS      Incontinent Diet (DYS 3)  AMBULATORY STATUS COMMUNICATION OF NEEDS Skin   Extensive Assist Verbally Normal                       Personal Care Assistance Level of Assistance  Bathing, Feeding, Dressing Bathing Assistance: Maximum assistance Feeding assistance: Limited assistance Dressing Assistance: Maximum assistance     Functional Limitations Info  Sight, Hearing, Speech Sight Info: Adequate Hearing Info: Adequate Speech Info: Adequate    SPECIAL CARE FACTORS FREQUENCY  PT (By licensed PT)     PT Frequency: 2 times weekly.              Contractures      Additional Factors Info  Code Status, Allergies Code Status Info: DNR Allergies Info: ZITHROMAX AZITHROMYCIN, IODINE, IVP DYE IODINATED DIAGNOSTIC AGENTS, SHELLFISH ALLERGY            Current Medications (01/17/2017):  This is the current hospital active medication list Current Facility-Administered Medications  Medication Dose Route Frequency Provider Last Rate Last Dose  . acetaminophen (TYLENOL) suppository 650 mg  650 mg Rectal Q4H PRN Schorr, Rhetta Mura, NP   650 mg at 01/12/17 7902  . acetylcysteine (MUCOMYST) 20 % nebulizer / oral solution 2 mL  2 mL Nebulization TID Berton Mount, RPH   2 mL at 01/17/17 0735  . albuterol (PROVENTIL) (2.5 MG/3ML) 0.083% nebulizer solution 2.5 mg  2.5 mg Nebulization Q6H PRN Amin, Ankit Chirag, MD   2.5 mg at 01/17/17 0735  .  budesonide (PULMICORT) nebulizer solution 0.25 mg  0.25 mg Nebulization QPC breakfast Amin, Ankit Chirag, MD   0.25 mg at 01/17/17 0735  . Carbidopa-Levodopa ER 48.75-195 MG CPCR 1 capsule  1 capsule Oral QID Patrecia Pour, MD   1 capsule at 01/17/17 0804  . hydrALAZINE (APRESOLINE) injection 5 mg  5 mg Intravenous Q2H PRN Patrecia Pour, MD   5 mg at 01/17/17 0326  . hydrALAZINE (APRESOLINE) tablet 10 mg  10 mg Oral Q8H Patrecia Pour, MD   10 mg at 01/17/17 6825  . levothyroxine  (SYNTHROID, LEVOTHROID) tablet 25 mcg  25 mcg Oral QAC breakfast Thomes Lolling, RPH   25 mcg at 01/17/17 0741  . piperacillin-tazobactam (ZOSYN) IVPB 3.375 g  3.375 g Intravenous Q8H Pham, Anh P, RPH 12.5 mL/hr at 01/17/17 1143 3.375 g at 01/17/17 1143  . rasagiline (AZILECT) tablet 1 mg  1 mg Oral Q1400 Patrecia Pour, MD   1 mg at 01/16/17 1426  . [place]   Oral PRN Patrecia Pour, MD      . rivastigmine (EXELON) 13.3 MG/24HR 13.3 mg  13.3 mg Transdermal Daily Amin, Ankit Chirag, MD   13.3 mg at 01/16/17 2133  . rotigotine (NEUPRO) 4 MG/24HR 1 patch  1 patch Transdermal Daily Damita Lack, MD   1 patch at 01/16/17 2134     Discharge Medications: Please see discharge summary for a list of discharge medications.  Relevant Imaging Results:  Relevant Lab Results:   Additional Information SSN- [id number]  Wetzel Bjornstad, LCSWA

## 2017-01-17 NOTE — Progress Notes (Signed)
Pt is asleep and resting comfortably, cpt held at this time.

## 2017-01-18 ENCOUNTER — Inpatient Hospital Stay (HOSPITAL_COMMUNITY): Payer: Medicare Other

## 2017-01-18 DIAGNOSIS — J471 Bronchiectasis with (acute) exacerbation: Secondary | ICD-10-CM

## 2017-01-18 DIAGNOSIS — J9601 Acute respiratory failure with hypoxia: Secondary | ICD-10-CM

## 2017-01-18 DIAGNOSIS — J470 Bronchiectasis with acute lower respiratory infection: Secondary | ICD-10-CM

## 2017-01-18 DIAGNOSIS — E038 Other specified hypothyroidism: Secondary | ICD-10-CM

## 2017-01-18 LAB — CBC
HEMATOCRIT: 31.1 % — AB (ref 36.0–46.0)
HEMOGLOBIN: 9.8 g/dL — AB (ref 12.0–15.0)
MCH: 28.9 pg (ref 26.0–34.0)
MCHC: 31.5 g/dL (ref 30.0–36.0)
MCV: 91.7 fL (ref 78.0–100.0)
Platelets: 388 10*3/uL (ref 150–400)
RBC: 3.39 MIL/uL — ABNORMAL LOW (ref 3.87–5.11)
RDW: 16.5 % — ABNORMAL HIGH (ref 11.5–15.5)
WBC: 7.7 10*3/uL (ref 4.0–10.5)

## 2017-01-18 LAB — BASIC METABOLIC PANEL
Anion gap: 10 (ref 5–15)
BUN: 18 mg/dL (ref 6–20)
CHLORIDE: 100 mmol/L — AB (ref 101–111)
CO2: 28 mmol/L (ref 22–32)
CREATININE: 0.79 mg/dL (ref 0.44–1.00)
Calcium: 8.3 mg/dL — ABNORMAL LOW (ref 8.9–10.3)
GFR calc Af Amer: >60 mL/min (ref >60)
GFR calc non Af Amer: >60 mL/min (ref >60)
GLUCOSE: 90 mg/dL (ref 65–99)
POTASSIUM: 3.5 mmol/L (ref 3.5–5.1)
Sodium: 138 mmol/L (ref 135–145)

## 2017-01-18 LAB — GLUCOSE, CAPILLARY: Glucose-Capillary: 90 mg/dL (ref 65–99)

## 2017-01-18 MED ORDER — CEFTAZIDIME 1 G IJ SOLR
1.0000 g | INTRAMUSCULAR | Status: DC
  Administered 2017-01-18 – 2017-01-24 (×7): 1 g via INTRAVENOUS
  Filled 2017-01-18 (×7): qty 1

## 2017-01-18 MED ORDER — SODIUM CHLORIDE 3 % IN NEBU
4.0000 mL | INHALATION_SOLUTION | Freq: Two times a day (BID) | RESPIRATORY_TRACT | Status: AC
  Administered 2017-01-18 – 2017-01-20 (×5): 4 mL via RESPIRATORY_TRACT
  Filled 2017-01-18 (×6): qty 4

## 2017-01-18 MED ORDER — ALBUTEROL SULFATE (2.5 MG/3ML) 0.083% IN NEBU
2.5000 mg | INHALATION_SOLUTION | Freq: Four times a day (QID) | RESPIRATORY_TRACT | Status: DC
  Administered 2017-01-18 – 2017-01-19 (×4): 2.5 mg via RESPIRATORY_TRACT
  Filled 2017-01-18 (×5): qty 3

## 2017-01-18 MED ORDER — SACCHAROMYCES BOULARDII 250 MG PO CAPS
250.0000 mg | ORAL_CAPSULE | Freq: Two times a day (BID) | ORAL | Status: DC
  Administered 2017-01-18 – 2017-01-24 (×11): 250 mg via ORAL
  Filled 2017-01-18 (×12): qty 1

## 2017-01-18 MED ORDER — GUAIFENESIN ER 600 MG PO TB12
1200.0000 mg | ORAL_TABLET | Freq: Two times a day (BID) | ORAL | Status: DC
  Administered 2017-01-18 – 2017-01-19 (×3): 1200 mg via ORAL
  Filled 2017-01-18 (×5): qty 2

## 2017-01-18 MED ORDER — BUDESONIDE 0.25 MG/2ML IN SUSP: 0.5000 mg | Freq: Every day | RESPIRATORY_TRACT | Status: DC

## 2017-01-18 NOTE — Progress Notes (Signed)
Physical Therapy Treatment Patient Details Name: Shannon Dunn MRN: [id number] DOB: 04-Mar-1924 Today's Date: 01/18/2017    History of Present Illness Shannon Dunn a 83 y.o.femalewith medical history significant of COPD/bronchiectasis, ORIF hip, Parkinson's disease, Paget bone disease, osteoporosis, history of colon cancer who was brought to the ER from [place] Unit by the family due to concerns of abnormal breath sounds and decreased mentation. History is per family due to patient's mental status.    PT Comments    The patient is more alert than previous session. Requires extensive assistance for mobility.    Follow Up Recommendations  SNF     Equipment Recommendations  None recommended by PT    Recommendations for Other Services       Precautions / Restrictions Precautions Precautions: Fall Precaution Comments: incontinence    Mobility  Bed Mobility   Bed Mobility: Supine to Sit;Sit to Supine     Supine to sit: Max assist;+2 for physical assistance;+2 for safety/equipment Sit to supine: +2 for safety/equipment;+2 for physical assistance;Max assist   General bed mobility comments: multi-modal cues for attention to task ; +2 for trunk control/elevation and lines  Transfers                 General transfer comment: assisted back to bed.  Ambulation/Gait                 Stairs            Wheelchair Mobility    Modified Rankin (Stroke Patients Only)       Balance Overall balance assessment: Needs assistance;History of Falls Sitting-balance support: Feet supported Sitting balance-Leahy Scale: Poor Sitting balance - Comments: listing to left                                    Cognition Arousal/Alertness: Awake/alert Behavior During Therapy: WFL for tasks assessed/performed Overall Cognitive Status: History of cognitive impairments - at baseline                                        Exercises       General Comments        Pertinent Vitals/Pain Pain Assessment: Faces Faces Pain Scale: Hurts little more Pain Location: right side of bzck Pain Descriptors / Indicators: Cramping Pain Intervention(s): Monitored during session    Home Living                      Prior Function            PT Goals (current goals can now be found in the care plan section) Progress towards PT goals: Progressing toward goals    Frequency    Min 2X/week      PT Plan Current plan remains appropriate    Co-evaluation              AM-PAC PT "6 Clicks" Daily Activity  Outcome Measure  Difficulty turning over in bed (including adjusting bedclothes, sheets and blankets)?: Total Difficulty moving from lying on back to sitting on the side of the bed? : Total Difficulty sitting down on and standing up from a chair with arms (e.g., wheelchair, bedside commode, etc,.)?: Total Help needed moving to and from a bed to chair (including a wheelchair)?: Total Help needed walking in  hospital room?: Total Help needed climbing 3-5 steps with a railing? : Total 6 Click Score: 6    End of Session   Activity Tolerance: Patient tolerated treatment well Patient left: in bed;with call bell/phone within reach;with bed alarm set Nurse Communication: Mobility status PT Visit Diagnosis: Other abnormalities of gait and mobility (R26.89)     Time: 8466-5993 PT Time Calculation (min) (ACUTE ONLY): 16 min  Charges:  $Therapeutic Activity: 8-22 mins                    G CodesTresa Endo PT [phone]    Claretha Cooper 01/18/2017, 3:30 PM

## 2017-01-18 NOTE — Progress Notes (Addendum)
Patient ID: Shannon Dunn, female   DOB: [birth year], 83 y.o.   MRN: [id number]  PROGRESS NOTE    Shannon Dunn  VZD:[id number] DOB: 03/23/1924 DOA: 01/10/2017  PCP: Renata Caprice, DO   Brief Narrative:  83 year old female with history of COPD and bronchiectasis, Parkinson's, Paget's bone disease and colon ca who presented to ED from memory care unit due to cough productive of greenish sputum, fevers and decreased mentation. Pt was found to have pneumonia and extensive mucus plugging. She was found to have pseudomonas in sputum and is currently on zosyn. Pulmonary consulted this am.  Assessment & Plan:   Active Problems: Acute respiratory failure with hypoxia in the setting of acute COPD exacerbation and bronchiectasis, mucus plugging and HCAP / Pseudomonas in sputum - CXR on 7/2 showed almost complete consolidation left thorax with sparring of the left lung apex. Mediastinal shift to left, may reflect atelectasis from mucous plug or central obstructing lesion - Repeat CXR this am to follow up if there is any improvement in bronchiectasis, pneumonia - Pulmonary consulted and we appreciate their assistance - Continue zosyn  - Sputum cx grew pseudomonas species  - Continue chest physiotherapy   Parkinson's disease ([place]) - SNF on discharge   Hypothyroidism  - Continue synthroid   Hypokalemia - Supplemented and WNL  Essential hypertension - Continue hydralazine   Malnutrition of moderate degree - In the context of chronic illness - Diet at tolerated   Dementia - No significant changes in mental status  Forehead lesion - Possible skin ca - Outpt follow up  DVT prophylaxis: SCD's Code Status: DNR/DNI Family Communication: daughter at the bedside this am Disposition Plan: will need to be seen by pulmonary   Consultants:   PCT  Pulmonary - 7/5  Procedures:   BiPAP  Antimicrobials:   Vanco 6/27 - 7/3  Cefepime 6/27 - 6/28  Zosyn 6/28 -->    Subjective: No  overnight events.   Objective: Vitals:   01/17/17 1416 01/17/17 2117 01/18/17 0537 01/18/17 0804  BP: (!) 116/50 120/64 (!) 124/59   Pulse: 96 88 94   Resp: 20 20 20    Temp: 98.5 F (36.9 C) 99 F (37.2 C) (!) 97.5 F (36.4 C)   TempSrc: Oral Oral Axillary   SpO2: 94% 98% 95% 92%  Weight:      Height:        Intake/Output Summary (Last 24 hours) at 01/18/17 1023 Last data filed at 01/18/17 0536  Gross per 24 hour  Intake            157.5 ml  Output                0 ml  Net            157.5 ml   Filed Weights   01/10/17 1703 01/12/17 0252 01/15/17 0610  Weight: 39 kg (85 lb 15.7 oz) 40.1 kg (88 lb 6.5 oz) 38.3 kg (84 lb 7 oz)    Examination:  Physical Exam  Constitutional: Appears ill. No distress.  CVS: RRR, S1/S2 + Pulmonary: diminished breath sounds left more than right  Abdominal: Soft. BS +,  no distension, tenderness, rebound or guarding.  Musculoskeletal: No edema and no tenderness.  Lymphadenopathy: No lymphadenopathy noted, cervical, inguinal. Neuro: Alert. No focal deficits Skin: ecchymoses UE and LE Psychiatric: Normal mood and affect.    Data Reviewed: I have personally reviewed following labs and imaging studies  CBC:  Recent Labs Lab 01/13/17  0354 01/14/17 0340 01/15/17 0336 01/17/17 0249 01/18/17 0528  WBC 10.7* 7.6 8.4 8.3 7.7  HGB 9.4* 8.9* 9.6* 9.5* 9.8*  HCT 29.6* 28.7* 30.4* 30.2* 31.1*  MCV 92.8 91.4 93.0 91.5 91.7  PLT 320 281 339 359 656   Basic Metabolic Panel:  Recent Labs Lab 01/12/17 0315 01/13/17 0420 01/14/17 0340 01/15/17 0336 01/16/17 0315 01/17/17 0249 01/18/17 0528  NA 139 143 143  --   --  137 138  K 3.6 3.2* 3.8  --   --  3.4* 3.5  CL 107 109 110  --   --  99* 100*  CO2 25 28 27   --   --  32 28  GLUCOSE 95 122* 119*  --   --  88 90  BUN 12 10 9   --   --  16 18  CREATININE 0.72 0.72 0.64 0.77 0.66 0.76 0.79  CALCIUM 7.4* 7.4* 7.4*  --   --  7.8* 8.3*   GFR: Estimated Creatinine Clearance: 28.3 mL/min  (by C-G formula based on SCr of 0.79 mg/dL). Liver Function Tests: No results for input(s): AST, ALT, ALKPHOS, BILITOT, PROT, ALBUMIN in the last 168 hours. No results for input(s): LIPASE, AMYLASE in the last 168 hours. No results for input(s): AMMONIA in the last 168 hours. Coagulation Profile: No results for input(s): INR, PROTIME in the last 168 hours. Cardiac Enzymes: No results for input(s): CKTOTAL, CKMB, CKMBINDEX, TROPONINI in the last 168 hours. BNP (last 3 results) No results for input(s): PROBNP in the last 8760 hours. HbA1C: No results for input(s): HGBA1C in the last 72 hours. CBG:  Recent Labs Lab 01/15/17 1223 01/16/17 0907 01/16/17 1252 01/17/17 0754 01/18/17 0727  GLUCAP 110* 80 109* 94 90   Lipid Profile: No results for input(s): CHOL, HDL, LDLCALC, TRIG, CHOLHDL, LDLDIRECT in the last 72 hours. Thyroid Function Tests: No results for input(s): TSH, T4TOTAL, FREET4, T3FREE, THYROIDAB in the last 72 hours. Anemia Panel: No results for input(s): VITAMINB12, FOLATE, FERRITIN, TIBC, IRON, RETICCTPCT in the last 72 hours. Urine analysis:    Component Value Date/Time   COLORURINE YELLOW 01/11/2017 [place] 01/11/2017 0643   LABSPEC 1.012 01/11/2017 0643   PHURINE 5.0 01/11/2017 0643   GLUCOSEU NEGATIVE 01/11/2017 0643   HGBUR NEGATIVE 01/11/2017 0643   BILIRUBINUR NEGATIVE 01/11/2017 0643   KETONESUR NEGATIVE 01/11/2017 0643   PROTEINUR NEGATIVE 01/11/2017 0643   UROBILINOGEN 0.2 04/09/2015 1355   NITRITE NEGATIVE 01/11/2017 0643   LEUKOCYTESUR NEGATIVE 01/11/2017 0643   Sepsis Labs: @LABRCNTIP (procalcitonin:4,lacticidven:4)   ) Recent Results (from the past 240 hour(s))  Culture, blood (routine x 2)     Status: None   Collection Time: 01/10/17  1:17 PM  Result Value Ref Range Status   Specimen Description BLOOD RIGHT ANTECUBITAL  Final   Special Requests   Final    BOTTLES DRAWN AEROBIC AND ANAEROBIC Blood Culture adequate volume     Culture   Final    NO GROWTH 5 DAYS Performed at [facility], [place] [address], [place], [place] [address]    Report Status 01/15/2017 FINAL  Final  Culture, blood (routine x 2)     Status: None   Collection Time: 01/10/17  5:22 PM  Result Value Ref Range Status   Specimen Description BLOOD LEFT ARM  Final   Special Requests   Final    BOTTLES DRAWN AEROBIC AND ANAEROBIC Blood Culture adequate volume   Culture   Final  NO GROWTH 5 DAYS Performed at [facility], [place] [address] [place], [place] [address]    Report Status 01/15/2017 FINAL  Final  MRSA PCR Screening     Status: None   Collection Time: 01/10/17  8:00 PM  Result Value Ref Range Status   MRSA by PCR NEGATIVE NEGATIVE Final    Comment:        The GeneXpert MRSA Assay (FDA approved for NASAL specimens only), is one component of a comprehensive MRSA colonization surveillance program. It is not intended to diagnose MRSA infection nor to guide or monitor treatment for MRSA infections.   Culture, expectorated sputum-assessment     Status: None   Collection Time: 01/13/17 12:21 PM  Result Value Ref Range Status   Specimen Description SPUTUM  Final   Special Requests NONE  Final   Sputum evaluation THIS SPECIMEN IS ACCEPTABLE FOR SPUTUM CULTURE  Final   Report Status 01/13/2017 FINAL  Final  Culture, respiratory (NON-Expectorated)     Status: None   Collection Time: 01/13/17 12:25 PM  Result Value Ref Range Status   Specimen Description SPUTUM  Final   Special Requests NONE  Final   Gram Stain   Final    ABUNDANT WBC PRESENT, PREDOMINANTLY PMN RARE GRAM POSITIVE COCCI IN PAIRS RARE GRAM NEGATIVE RODS RARE GRAM POSITIVE RODS Performed at [facility], [address] [place], [place] [address]    Culture MODERATE PSEUDOMONAS AERUGINOSA  Final   Report Status 01/17/2017 FINAL  Final   Organism ID, Bacteria PSEUDOMONAS AERUGINOSA  Final      Susceptibility   Pseudomonas aeruginosa - MIC*     CEFTAZIDIME <=1 SENSITIVE Sensitive     CIPROFLOXACIN <=0.25 SENSITIVE Sensitive     GENTAMICIN <=1 SENSITIVE Sensitive     IMIPENEM 1 SENSITIVE Sensitive     CEFEPIME <=1 SENSITIVE Sensitive     * MODERATE PSEUDOMONAS AERUGINOSA      Radiology Studies: Dg Chest Port 1 View  Result Date: 01/15/2017 CLINICAL DATA:  83 year old female respiratory failure. Subsequent encounter. EXAM: PORTABLE CHEST 1 VIEW COMPARISON:  01/13/2017 01/04/2016 2018 chest x-ray. FINDINGS: Almost complete consolidation left thorax with sparring of the left lung apex. Mediastinal shift to left. This may reflect atelectasis from mucous plug or central obstructing lesion. Post obstructive infiltrate or pleural effusion not excluded. No pneumothorax. Calcified aorta. IMPRESSION: Almost complete consolidation left thorax with sparring of the left lung apex. Mediastinal shift to left. This may reflect atelectasis from mucous plug or central obstructing lesion. Electronically Signed   By: Genia Del M.D.   On: 01/15/2017 06:49        Scheduled Meds: . budesonide  0.25 mg Nebulization QPC breakfast  . Carbidopa-Levodopa ER  1 capsule Oral QID  . hydrALAZINE  10 mg Oral Q8H  . levothyroxine  25 mcg Oral QAC breakfast  . rasagiline  1 mg Oral Q1400  . rivastigmine  13.3 mg Transdermal Daily  . rotigotine  1 patch Transdermal Daily  . saccharomyces boulardii  250 mg Oral BID   Continuous Infusions: . piperacillin-tazobactam (ZOSYN)  IV Stopped (01/18/17 0536)     LOS: 8 days    Time spent: 25 minutes  Greater than 50% of the time spent on counseling and coordinating the care.   Leisa Lenz, MD [facility] Pager [phone]  If 7PM-7AM, please contact night-coverage [email or web address] Password [email or web address] 01/18/2017, 10:23 AM

## 2017-01-18 NOTE — Progress Notes (Addendum)
Pharmacy Antibiotic Note  Shannon Dunn is a 83 y.o. female admitted on 01/10/2017 with SOB and AMS. Vancomycin and Cefepime initially started for PNA, then transitioned to Zosyn on 6/28. Pseudomonas aeruginosa growing in respiratory culture, and antibiotics changed to Ceftazidime today per sensitivities per PCCM. Per discussion with lab, they did attempt to run sensitivities of the Pseudomonas to Zosyn, but due to processing error, unable to determine if sensitive or not. Pharmacy has been consulted for Ceftazidime dosing.  Plan: Ceftazidime 1g IV q24h. Monitor renal function, clinical course.   Height: 4\' 9"  (144.8 cm) Weight: 84 lb 7 oz (38.3 kg) IBW/kg (Calculated) : 38.6  Temp (24hrs), Avg:98.3 F (36.8 C), Min:97.5 F (36.4 C), Max:99 F (37.2 C)   Recent Labs Lab 01/13/17 0420 01/14/17 0340 01/14/17 1519 01/15/17 0336 01/16/17 0315 01/17/17 0249 01/18/17 0528  WBC 10.7* 7.6  --  8.4  --  8.3 7.7  CREATININE 0.72 0.64  --  0.77 0.66 0.76 0.79  VANCOTROUGH  --   --  7*  --   --   --   --     Estimated Creatinine Clearance: 28.3 mL/min (by C-G formula based on SCr of 0.79 mg/dL).    Allergies  Allergen Reactions  . Zithromax [Azithromycin] Diarrhea  . Iodine Other (See Comments)    Unknown, might be rash/hives  . Ivp Dye [Iodinated Diagnostic Agents] Other (See Comments)    Unknown rash/hives maybe  . Shellfish Allergy Other (See Comments)    Unknown rash/hives, scallops     Antimicrobials this admission:  6/27 Vancomycin >> 7/3 6/27 Cefepime >> 6/28 6/28 Zosyn >> 7/5 7/5 Ceftazidime >>  Microbiology results:  6/27 BCx: NGF 6/27 MRSA PCR: neg 6/30 Sputum Cx: P. Aeruginosa-S to ceftazidime, cefepime, ciprofloxacin, gentamicin, imipenem   Thank you for allowing pharmacy to be a part of this patient's care.   Lindell Spar, PharmD, BCPS Pager: [phone] 01/18/2017 3:12 PM

## 2017-01-18 NOTE — Consult Note (Signed)
Name: Shannon Dunn MRN: [id number] DOB: 1924/04/07    ADMISSION DATE:  01/10/2017 CONSULTATION DATE:  01/18/17  REFERRING MD :  Dr. Charlies Silvers   CHIEF COMPLAINT:  Cough, Fever, AMS   HISTORY OF PRESENT ILLNESS:  83 y/o F, never smoker, who resides in a memory care unit with Parkinson's Disease, COPD & Bronchiectasis who presented to [facility] on 6/27 with reports of increased congestion, altered mental status & shortness of breath.    Initial ER evaluation notable for fever to 101.1, tachypnea, tachycardia, edema and crackles on exam.  Initial CXR was concerning for left basilar airspace disease with effusion.  She was admitted per [facility] for HCAP and treated with cefepime and vancomycin.  She was initially treated with BiPAP for increased work of breathing.  Family requested patient be DNR/DNI on admit but were ok with BiPAP and antibiotics.  Cultures obtained revealed pseudomonas in the sputum that is pan-sensitive.  Blood cultures negative.  Most recent CXR on 7/2 demonstrates near complete opacification on the left with mediastinal shift to the left.  Continues to required 2L O2 (new O2 requirement, has been on in the past but taken off).    PCCM consulted for evaluation of L opacification / PNA, acute hypoxic respiratory failure.    At baseline, the daughter reports she is active at her facility - she leads the volleyball team (balloon volleyball), sings in the mornings with the staff and is usually up walking around.  [place] age 6. 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  Her prior hobbies included walking on stilts.    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   has a past surgical history that includes Colon surgery; Breast surgery (Left); Hip pinning, cannulated (Left, 11/04/2015); and Hip Arthroplasty (Left, 11/04/2015).  Prior to Admission medications   Medication Sig Start Date End Date Taking? Authorizing Provider  albuterol (PROVENTIL) (2.5 MG/3ML) 0.083% nebulizer solution Take 3 mLs (2.5 mg total) by nebulization every 6 (six) hours as needed for wheezing or shortness of breath. Patient taking differently: Take 2.5 mg by nebulization 2 (two) times daily.  03/09/16  Yes Tanda Rockers, MD  budesonide (PULMICORT) 0.25 MG/2ML nebulizer solution Take 0.25 mg by nebulization daily after breakfast.    Yes [provider]  Calcium Citrate-Vitamin D 200-250 MG-UNIT TABS Take 3 tablets by mouth 2 (two) times daily.   Yes [provider]  Carbidopa-Levodopa ER (RYTARY) 48.75-195 MG CPCR Take 1 capsule by mouth 4 (four) times daily. Takes 1 capsule by mouth 4 times a day at 0000, 0800, 1400 and 1800   Yes [provider]  cholecalciferol (VITAMIN D) 1000 units tablet Take 3,000 Units by mouth daily with breakfast.    Yes [provider]  ENSURE (ENSURE) Take 237 mLs by mouth 2 (two) times daily between meals. *Vanilla*   Yes [provider]  fluticasone (FLONASE) 50 MCG/ACT nasal spray Place 1 spray into both nostrils daily after breakfast.    Yes [provider]  GuaiFENesin (DIABETIC TUSSIN EX PO) Take 5 mLs by mouth every 6 (six) hours as needed (congestion/cough.).   Yes [provider]  levothyroxine (SYNTHROID, [place])  25 MCG tablet Take 1 tablet (25 mcg total) by mouth daily before breakfast. 11/08/15  Yes Robbie Lis, MD  loperamide (IMODIUM) 2 MG capsule Take 2 mg by mouth daily as needed for diarrhea or loose stools.   Yes [provider]  Probiotic Product (ACIDOPHILUS PROBIOTIC BLEND) CAPS Take 1 capsule by mouth daily with breakfast.   Yes [provider]  ranitidine (ZANTAC) 75 MG tablet Take 75 mg by mouth 2 (two) times daily.   Yes [provider]  rasagiline (AZILECT) 1 MG TABS tablet Take 1 mg by mouth daily at 2 PM.    Yes [provider]  Rivastigmine (EXELON) 13.3 MG/24HR PT24 Place 1 patch onto the skin daily. Rotate sites   Yes [provider]  rotigotine (NEUPRO) 4 MG/24HR Place 1 patch onto the skin daily. Remove old patch before applying new one.   Yes [provider]  senna-docusate (SENOKOT-S) 8.6-50 MG tablet Take 1 tablet by mouth at bedtime as needed for mild constipation. 11/08/15  Yes Robbie Lis, MD  vitamin B-12 (CYANOCOBALAMIN) 500 MCG tablet Take 500 mcg by mouth daily with breakfast.    Yes [provider]  Wheat Dextrin (BENEFIBER PO) Take 1 scoop by mouth daily with breakfast. Mix iIn 120 ml of fluid   Yes [provider]    Allergies  Allergen Reactions  . Zithromax [Azithromycin] Diarrhea  . Iodine Other (See Comments)    Unknown, might be rash/hives  . Ivp Dye [Iodinated Diagnostic Agents] Other (See Comments)    Unknown rash/hives maybe  . Shellfish Allergy Other (See Comments)    Unknown rash/hives, scallops     FAMILY HISTORY:  family history includes Alzheimer's disease in her father; Heart disease in her mother.  SOCIAL HISTORY:  reports that she has never smoked. She has never used smokeless tobacco. She reports that she drinks alcohol. She reports that she does not use drugs.  REVIEW OF SYSTEMS:  POSITIVES IN BOLD Constitutional: Negative for fever, chills, weight loss, malaise/fatigue and diaphoresis.  HENT: Negative for hearing loss, ear pain, nosebleeds, congestion, sore throat, neck pain, tinnitus and ear discharge.   Eyes: Negative for blurred vision, double vision, photophobia, pain, discharge and redness.  Respiratory: Negative for cough, hemoptysis, sputum production, shortness of breath, wheezing and stridor.     Cardiovascular: Negative for chest pain, palpitations, orthopnea, claudication, leg swelling and PND.  Gastrointestinal: Negative for heartburn, nausea, vomiting, abdominal pain, diarrhea, constipation, blood in stool and melena.  Genitourinary: Negative for dysuria, urgency, frequency, hematuria and flank pain.  Musculoskeletal: Negative for myalgias, back pain, joint pain and falls.  Skin: Negative for itching and rash.  Neurological: Negative for dizziness, tingling, tremors, sensory change, speech change, focal weakness, seizures, loss of consciousness, weakness and headaches.  Endo/Heme/Allergies: Negative for environmental allergies and polydipsia. Does not bruise/bleed easily.  SUBJECTIVE:   VITAL SIGNS: Temp:  [97.5 F (36.4 C)-99 F (37.2 C)] 97.5 F (36.4 C) (07/05 0537) Pulse Rate:  [88-96] 94 (07/05 0537) Resp:  [20] 20 (07/05 0537) BP: (116-124)/(50-64) 124/59 (07/05 0537) SpO2:  [92 %-98 %] 92 % (07/05 0804) FiO2 (%):  [2 %] 2 % (07/04 2117)  PHYSICAL EXAMINATION: General: frail elderly female in NAD HEENT: MM pink/moist, mid-forehead raised lesion with scab (?skin cancer) Neuro: sleeping, spontaneously moves all ext's, awakens / alert, speech clear but not oriented CV: s1s2 rrr, no m/r/g PULM: even/non-labored, clear on R, coarse rhonchi on L JK:DTOI, non-tender, bsx4 active  Extremities: warm/dry, no edema  Skin: no rashes or lesions, multiple scars on face, arms from skin cancer removals    Recent Labs Lab 01/14/17 0340  01/16/17 0315 01/17/17 0249 01/18/17 0528  NA 143  --   --  137 138  K 3.8  --   --  3.4* 3.5  CL 110  --   --  99* 100*  CO2 27  --   --  32 28  BUN 9  --   --  16 18  CREATININE 0.64  < > 0.66 0.76 0.79  GLUCOSE 119*  --   --  88 90  < > = values in this interval not displayed.   Recent Labs Lab 01/15/17 0336 01/17/17 0249 01/18/17 0528  HGB 9.6* 9.5* 9.8*  HCT 30.4* 30.2* 31.1*  WBC 8.4 8.3 7.7  PLT 339 359 388    No  results found.    SIGNIFICANT EVENTS  6/27  Admit with fever, AMS, L opacity 7/05  PCCM consulted   STUDIES:     ASSESSMENT / PLAN:  Discussion: 83 y/o F with advanced Parkinson's disease / dementia, obstructive bronchiectasis / COPD admitted with L PNA. She was found to have pseudomonas in the sputum which is pan-sensitive.  Unfortunately, she has had poor clearance of L PNA despite antibiotic therapy. She recently has had a change in her Parkinson's medications and is being woke up in the middle of the night for medication which may be contributing to a component of aspiration.  In addition, she likes to lay on her left side.  Possible component of effusion on initial film.   Plan: Repeat CXR now Continue chest physiotherapy > change to manual Q4 while awake Add guaifenesin  Add flutter valve (if patient able to participate) Frequent turning with positioning of right side down Nebulized NS to induce cough / mucus clearance Follow intermittent CXR Mobilize as much as able > this will help with mucocilliary clearance Assess at bedside with [place] with small effusion > family does not want to pursue thoracentesis (agree)   GOC:  Daughter at bedside.  She understands that this could be a life ending process but is hopeful to conservatively treat the patient and get her turned around.  She wishes to continue supportive / non-invasive medical therapies.  DNR/DNI in the event of arrest.     Noe Gens, NP-C [facility] Pgr: [phone] or if no answer [phone] 01/18/2017, 10:32 AM

## 2017-01-19 ENCOUNTER — Inpatient Hospital Stay (HOSPITAL_COMMUNITY): Payer: Medicare Other

## 2017-01-19 LAB — CBC
HEMATOCRIT: 30 % — AB (ref 36.0–46.0)
HEMOGLOBIN: 9.5 g/dL — AB (ref 12.0–15.0)
MCH: 28.7 pg (ref 26.0–34.0)
MCHC: 31.7 g/dL (ref 30.0–36.0)
MCV: 90.6 fL (ref 78.0–100.0)
Platelets: 380 10*3/uL (ref 150–400)
RBC: 3.31 MIL/uL — ABNORMAL LOW (ref 3.87–5.11)
RDW: 16.4 % — ABNORMAL HIGH (ref 11.5–15.5)
WBC: 6.5 10*3/uL (ref 4.0–10.5)

## 2017-01-19 LAB — BASIC METABOLIC PANEL
Anion gap: 8 (ref 5–15)
BUN: 18 mg/dL (ref 6–20)
CHLORIDE: 104 mmol/L (ref 101–111)
CO2: 29 mmol/L (ref 22–32)
CREATININE: 0.62 mg/dL (ref 0.44–1.00)
Calcium: 7.9 mg/dL — ABNORMAL LOW (ref 8.9–10.3)
GFR calc non Af Amer: >60 mL/min (ref >60)
GLUCOSE: 84 mg/dL (ref 65–99)
Potassium: 3.4 mmol/L — ABNORMAL LOW (ref 3.5–5.1)
Sodium: 141 mmol/L (ref 135–145)

## 2017-01-19 LAB — GLUCOSE, CAPILLARY: Glucose-Capillary: 84 mg/dL (ref 65–99)

## 2017-01-19 MED ORDER — ALBUTEROL SULFATE (2.5 MG/3ML) 0.083% IN NEBU
2.5000 mg | INHALATION_SOLUTION | Freq: Four times a day (QID) | RESPIRATORY_TRACT | Status: DC
  Administered 2017-01-20 – 2017-01-22 (×9): 2.5 mg via RESPIRATORY_TRACT
  Filled 2017-01-19 (×10): qty 3

## 2017-01-19 MED ORDER — POTASSIUM CHLORIDE CRYS ER 20 MEQ PO TBCR
40.0000 meq | EXTENDED_RELEASE_TABLET | Freq: Once | ORAL | Status: AC
  Administered 2017-01-24: 40 meq via ORAL
  Filled 2017-01-19 (×2): qty 2

## 2017-01-19 MED ORDER — TOBRAMYCIN 300 MG/5ML IN NEBU
300.0000 mg | INHALATION_SOLUTION | Freq: Two times a day (BID) | RESPIRATORY_TRACT | Status: DC
  Administered 2017-01-19 – 2017-01-23 (×8): 300 mg via RESPIRATORY_TRACT
  Filled 2017-01-19 (×11): qty 5

## 2017-01-19 NOTE — Progress Notes (Signed)
Name: Shannon Dunn MRN: [id number] DOB: 1924-04-11    ADMISSION DATE:  01/10/2017 CONSULTATION DATE:  01/18/17  REFERRING MD :  Dr. Charlies Silvers   CHIEF COMPLAINT:  Cough, Fever, AMS   BRIEF SUMMARY:  83 y/o F, never smoker, who resides in a memory care unit with Parkinson's Disease, COPD & Bronchiectasis who presented to [facility] on 6/27 with reports of increased congestion, altered mental status & shortness of breath.  Found to have L PNA with small pleural effusion.  Progressed to opacification of the the left hemithorax with mediastinal shift to the left.  Cultures grew pseudomonas which was pan-sensitive (ceftazidime, cipro, sensitivities not done on zosyn).  ABX changed on 7/5 to ceftazidime.     SUBJECTIVE:  Pt denies acute complaints - specifically denies pain, SOB.   VITAL SIGNS: Temp:  [97.9 F (36.6 C)-98.4 F (36.9 C)] 97.9 F (36.6 C) (07/06 0511) Pulse Rate:  [84-93] 84 (07/06 0511) Resp:  [18] 18 (07/06 0511) BP: (112-143)/(52-72) 122/58 (07/06 0511) SpO2:  [94 %-100 %] 97 % (07/06 0511)  PHYSICAL EXAMINATION: General: frail elderly female in NAD, lying in bed HEENT: MM pink/moist, fair dentition  PSY: calm/appropriate Neuro: Awake, more alert today, speech clear, MAE, generalized weakness CV: s1s2 rrr, 3/6 SEM PULM: even/non-labored, clear on R, diminished on L WU:JWJX, non-tender, bsx4 active  Extremities: warm/dry, no edema  Skin: no rashes.  Multiple old scars on face, arms.  Central forehead lesion with scabbing    Recent Labs Lab 01/17/17 0249 01/18/17 0528 01/19/17 0525  NA 137 138 141  K 3.4* 3.5 3.4*  CL 99* 100* 104  CO2 32 28 29  BUN 16 18 18   CREATININE 0.76 0.79 0.62  GLUCOSE 88 90 84     Recent Labs Lab 01/17/17 0249 01/18/17 0528 01/19/17 0525  HGB 9.5* 9.8* 9.5*  HCT 30.2* 31.1* 30.0*  WBC 8.3 7.7 6.5  PLT 359 388 380    Dg Chest Port 1 View  Result Date: 01/18/2017 CLINICAL DATA:  Hx of bronchiectasis. Hx of COPD. Pt is very  SOB. Weakness. Not taking HTN meds. Not diabetic. Nonsmoker. No hx of CHF or AFIB. EXAM: PORTABLE CHEST 1 VIEW COMPARISON:  01/15/2017 FINDINGS: The patient's chin obscures the upper chest. There is persistent significant opacity throughout the left lung, with sparing of the lung apex. There is evidence for left lung volume loss. The appearance is similar to the prior study. Small right pleural effusion persists. Remote rib fractures are noted. Old right humerus fracture. IMPRESSION: Persistent significant opacity in the left lung, not significantly changed. Electronically Signed   By: Nolon Nations M.D.   On: 01/18/2017 11:08      SIGNIFICANT EVENTS  6/27  Admit with fever, AMS, L opacity 7/05  PCCM consulted, abx changed  STUDIES: 7/05  CXR >> modest improvement in L opacification 7/06  CXR >> near complete opacification of left side with mediastinal shift to L   ASSESSMENT / PLAN:  Discussion: 83 y/o F with advanced Parkinson's disease / dementia, obstructive bronchiectasis / COPD admitted with L PNA. She was found to have pseudomonas in the sputum which is pan-sensitive.  Unfortunately, she has had poor clearance of L PNA despite antibiotic therapy. She recently has had a change in her Parkinson's medications and is being woke up in the middle of the night for medication which may be contributing to a component of aspiration.  In addition, she likes to lay on her left side.  Possible  component of effusion on initial film, small volume fluid on bedside US assessment (7/5).  Family requests to defer thora.     Left Pseudomonas PNA Acute Hypoxic Respiratory Failure  Concern for Aspiration   Plan: Intermittent CXR Chest PT as able > manual PT Q4 while awake Continue guaifenesin  Plan for 7 days total of Ceftazidime given no known sensitivity to zosyn Add Tobramycin nebs, x2 weeks total Flutter valve (if pt able to participate) Frequent turning with positioning with right side down    Nebulized 3%NS to induce cough / mucus clearance  Mobilize as much as able  Aspiration precautions > D3 Diet  Follow up with Dr. Ashok Cordia as scheduled > 7/27 @ 10:45 am   GOC:  Daughter at bedside.  She understands that this could be a life ending process but is hopeful to conservatively treat the patient and get her turned around.  She wishes to continue supportive / non-invasive medical therapies.  DNR/DNI in the event of arrest.     Noe Gens, NP-C [facility] Pgr: [phone] or if no answer [phone] 01/19/2017, 8:28 AM

## 2017-01-19 NOTE — Progress Notes (Signed)
Patient ID: Shannon Dunn, female   DOB: [birth year], 83 y.o.   MRN: [id number]  PROGRESS NOTE    Shannon Dunn  ION:[id number] DOB: June 05, 1924 DOA: 01/10/2017  PCP: Renata Caprice, DO   Brief Narrative:  83 year old female with history of COPD and bronchiectasis, Parkinson's, Paget's bone disease and colon ca who presented to ED from memory care unit due to cough productive of greenish sputum, fevers and decreased mentation. Pt was found to have pneumonia and extensive mucus plugging. She was found to have pseudomonas in sputum. Due to worsening left lung opacity pulmonary consulted, changed abx to fortaz on 7/5 and added tobramycin. Family does not want thoracentesis or aggressive care.  Assessment & Plan:   Active Problems: Acute respiratory failure with hypoxia in the setting of acute COPD exacerbation and bronchiectasis, mucus plugging and HCAP / Pseudomonas in sputum - CXR on 7/2 showed almost complete consolidation left thorax with sparring of the left lung apex. Mediastinal shift to left, may reflect atelectasis from mucous plug or central obstructing lesion - Repeat CXR 7/5 showed persistent left lung opacity not significantly changed - Sputum cx grew pseudomonas  - Pulm changed abx to [place] 7/5 - continue for 7 days total  - Added tobramycin 7/5  - Continue chest PT  Parkinson's disease ([place]) - SNF on discharge   Hypothyroidism  - Continue synthroid   Hypokalemia - Supplemented   Essential hypertension - Continue hydralazine     Malnutrition of moderate degree - In the context of chronic illness - Diet as tolerated   Dementia - Stable  Forehead lesion - Outpt follow up  DVT prophylaxis: SCD's Code Status: DNR/DNI Family Communication: updated daughter at bedside 7/5 Disposition Plan: SNF once stable, likely by Monday    Consultants:   PCT  Pulmonary - 7/5  Procedures:   BiPAP  Antimicrobials:   Vanco 6/27 - 7/3  Cefepime 6/27 -  6/28  Zosyn 6/28 --> 7/5  Tressie Ellis 7/5 -->   Subjective: No overnight events.  Objective: Vitals:   01/18/17 2015 01/19/17 0511 01/19/17 1141 01/19/17 1414  BP: (!) 112/52 (!) 122/58  (!) 122/58  Pulse: 88 84  93  Resp: 18 18  18   Temp: 97.9 F (36.6 C) 97.9 F (36.6 C)  99 F (37.2 C)  TempSrc: Oral Oral  Oral  SpO2: 100% 97% 94% 100%  Weight:      Height:        Intake/Output Summary (Last 24 hours) at 01/19/17 1529 Last data filed at 01/19/17 0600  Gross per 24 hour  Intake               50 ml  Output              225 ml  Net             -175 ml   Filed Weights   01/10/17 1703 01/12/17 0252 01/15/17 0610  Weight: 39 kg (85 lb 15.7 oz) 40.1 kg (88 lb 6.5 oz) 38.3 kg (84 lb 7 oz)    Examination:  General exam: Appears calm and comfortable  Respiratory system: Diminished left lung sounds, no wheezing  Cardiovascular system: S1 & S2 heard, Rate controlled  Gastrointestinal system: Abdomen is nondistended, soft and nontender. No organomegaly or masses felt. Normal bowel sounds heard. Central nervous system: No focal neurological deficits. Disoriented. Extremities: Symmetric 5 x 5 power. Skin: No rashes, lesions or ulcers Psychiatry: Normal mood, behavior   Data Reviewed:  I have personally reviewed following labs and imaging studies  CBC:  Recent Labs Lab 01/14/17 0340 01/15/17 0336 01/17/17 0249 01/18/17 0528 01/19/17 0525  WBC 7.6 8.4 8.3 7.7 6.5  HGB 8.9* 9.6* 9.5* 9.8* 9.5*  HCT 28.7* 30.4* 30.2* 31.1* 30.0*  MCV 91.4 93.0 91.5 91.7 90.6  PLT 281 339 359 388 003   Basic Metabolic Panel:  Recent Labs Lab 01/13/17 0420 01/14/17 0340 01/15/17 0336 01/16/17 0315 01/17/17 0249 01/18/17 0528 01/19/17 0525  NA 143 143  --   --  137 138 141  K 3.2* 3.8  --   --  3.4* 3.5 3.4*  CL 109 110  --   --  99* 100* 104  CO2 28 27  --   --  32 28 29  GLUCOSE 122* 119*  --   --  88 90 84  BUN 10 9  --   --  16 18 18   CREATININE 0.72 0.64 0.77 0.66 0.76  0.79 0.62  CALCIUM 7.4* 7.4*  --   --  7.8* 8.3* 7.9*   GFR: Estimated Creatinine Clearance: 28.3 mL/min (by C-G formula based on SCr of 0.62 mg/dL). Liver Function Tests: No results for input(s): AST, ALT, ALKPHOS, BILITOT, PROT, ALBUMIN in the last 168 hours. No results for input(s): LIPASE, AMYLASE in the last 168 hours. No results for input(s): AMMONIA in the last 168 hours. Coagulation Profile: No results for input(s): INR, PROTIME in the last 168 hours. Cardiac Enzymes: No results for input(s): CKTOTAL, CKMB, CKMBINDEX, TROPONINI in the last 168 hours. BNP (last 3 results) No results for input(s): PROBNP in the last 8760 hours. HbA1C: No results for input(s): HGBA1C in the last 72 hours. CBG:  Recent Labs Lab 01/16/17 0907 01/16/17 1252 01/17/17 0754 01/18/17 0727 01/19/17 0729  GLUCAP 80 109* 94 90 84   Lipid Profile: No results for input(s): CHOL, HDL, LDLCALC, TRIG, CHOLHDL, LDLDIRECT in the last 72 hours. Thyroid Function Tests: No results for input(s): TSH, T4TOTAL, FREET4, T3FREE, THYROIDAB in the last 72 hours. Anemia Panel: No results for input(s): VITAMINB12, FOLATE, FERRITIN, TIBC, IRON, RETICCTPCT in the last 72 hours. Urine analysis:    Component Value Date/Time   COLORURINE YELLOW 01/11/2017 [place] 01/11/2017 0643   LABSPEC 1.012 01/11/2017 0643   PHURINE 5.0 01/11/2017 0643   GLUCOSEU NEGATIVE 01/11/2017 0643   HGBUR NEGATIVE 01/11/2017 0643   BILIRUBINUR NEGATIVE 01/11/2017 0643   KETONESUR NEGATIVE 01/11/2017 0643   PROTEINUR NEGATIVE 01/11/2017 0643   UROBILINOGEN 0.2 04/09/2015 1355   NITRITE NEGATIVE 01/11/2017 0643   LEUKOCYTESUR NEGATIVE 01/11/2017 0643   Sepsis Labs: @LABRCNTIP (procalcitonin:4,lacticidven:4)   ) Recent Results (from the past 240 hour(s))  Culture, blood (routine x 2)     Status: None   Collection Time: 01/10/17  1:17 PM  Result Value Ref Range Status   Specimen Description BLOOD RIGHT  ANTECUBITAL  Final   Special Requests   Final    BOTTLES DRAWN AEROBIC AND ANAEROBIC Blood Culture adequate volume   Culture   Final    NO GROWTH 5 DAYS Performed at [facility], [place] [address] [place], [place] [address]    Report Status 01/15/2017 FINAL  Final  Culture, blood (routine x 2)     Status: None   Collection Time: 01/10/17  5:22 PM  Result Value Ref Range Status   Specimen Description BLOOD LEFT ARM  Final   Special Requests   Final    BOTTLES DRAWN  AEROBIC AND ANAEROBIC Blood Culture adequate volume   Culture   Final    NO GROWTH 5 DAYS Performed at [facility], [place] [address], [place], [place] [address]    Report Status 01/15/2017 FINAL  Final  MRSA PCR Screening     Status: None   Collection Time: 01/10/17  8:00 PM  Result Value Ref Range Status   MRSA by PCR NEGATIVE NEGATIVE Final    Comment:        The GeneXpert MRSA Assay (FDA approved for NASAL specimens only), is one component of a comprehensive MRSA colonization surveillance program. It is not intended to diagnose MRSA infection nor to guide or monitor treatment for MRSA infections.   Culture, expectorated sputum-assessment     Status: None   Collection Time: 01/13/17 12:21 PM  Result Value Ref Range Status   Specimen Description SPUTUM  Final   Special Requests NONE  Final   Sputum evaluation THIS SPECIMEN IS ACCEPTABLE FOR SPUTUM CULTURE  Final   Report Status 01/13/2017 FINAL  Final  Culture, respiratory (NON-Expectorated)     Status: None   Collection Time: 01/13/17 12:25 PM  Result Value Ref Range Status   Specimen Description SPUTUM  Final   Special Requests NONE  Final   Gram Stain   Final    ABUNDANT WBC PRESENT, PREDOMINANTLY PMN RARE GRAM POSITIVE COCCI IN PAIRS RARE GRAM NEGATIVE RODS RARE GRAM POSITIVE RODS Performed at [facility], [address] [place], [place] [address]    Culture MODERATE PSEUDOMONAS AERUGINOSA  Final   Report Status 01/17/2017 FINAL   Final   Organism ID, Bacteria PSEUDOMONAS AERUGINOSA  Final      Susceptibility   Pseudomonas aeruginosa - MIC*    CEFTAZIDIME <=1 SENSITIVE Sensitive     CIPROFLOXACIN <=0.25 SENSITIVE Sensitive     GENTAMICIN <=1 SENSITIVE Sensitive     IMIPENEM 1 SENSITIVE Sensitive     CEFEPIME <=1 SENSITIVE Sensitive     * MODERATE PSEUDOMONAS AERUGINOSA      Radiology Studies: Dg Chest Port 1 View  Result Date: 01/19/2017 CLINICAL DATA:  History of bronchiectasis and COPD. Shortness of breath. Pneumonia. EXAM: PORTABLE CHEST 1 VIEW COMPARISON:  01/18/2017. FINDINGS: Remote right rib trauma. Osteopenia. Patient chin overlies the left apex. Patient rotated left. Mild volume loss in the left hemithorax. Heart size not well evaluated. Atherosclerosis in the transverse aorta. Progressive whiteout of the left hemithorax. No pneumothorax. Probable trace right pleural fluid. Clear right lung. IMPRESSION: Progressive, now complete whiteout of the left hemithorax. This likely due to a combination of pleural fluid and airspace disease. Given progression since 01/10/2017, mucous plugging in the left endobronchial tree is also a consideration. Aortic Atherosclerosis (ICD10-I70.0). Electronically Signed   By: Abigail Miyamoto M.D.   On: 01/19/2017 09:40   Dg Chest Port 1 View  Result Date: 01/18/2017 CLINICAL DATA:  Hx of bronchiectasis. Hx of COPD. Pt is very SOB. Weakness. Not taking HTN meds. Not diabetic. Nonsmoker. No hx of CHF or AFIB. EXAM: PORTABLE CHEST 1 VIEW COMPARISON:  01/15/2017 FINDINGS: The patient's chin obscures the upper chest. There is persistent significant opacity throughout the left lung, with sparing of the lung apex. There is evidence for left lung volume loss. The appearance is similar to the prior study. Small right pleural effusion persists. Remote rib fractures are noted. Old right humerus fracture. IMPRESSION: Persistent significant opacity in the left lung, not significantly changed.  Electronically Signed   By: Benjamine Mola  Owens Shark M.D.   On: 01/18/2017 11:08     Scheduled Meds: . albuterol  2.5 mg Nebulization Q6H WA  . Carbidopa-Levodopa ER  1 capsule Oral QID  . guaiFENesin  1,200 mg Oral BID  . hydrALAZINE  10 mg Oral Q8H  . levothyroxine  25 mcg Oral QAC breakfast  . rasagiline  1 mg Oral Q1400  . rivastigmine  13.3 mg Transdermal Daily  . rotigotine  1 patch Transdermal Daily  . saccharomyces boulardii  250 mg Oral BID  . sodium chloride HYPERTONIC  4 mL Nebulization BID  . tobramycin (PF)  300 mg Nebulization BID   Continuous Infusions: . cefTAZidime (FORTAZ)  IV Stopped (01/18/17 1802)     LOS: 9 days    Time spent: 25 minutes  Greater than 50% of the time spent on counseling and coordinating the care.   Leisa Lenz, MD [facility] Pager [phone]  If 7PM-7AM, please contact night-coverage [email or web address] Password [email or web address] 01/19/2017, 3:29 PM

## 2017-01-19 NOTE — Progress Notes (Addendum)
Speech Language Pathology Treatment: Dysphagia  Patient Details Name: Shannon Dunn MRN: [id number] DOB: July 25, 1923 Today's Date: 01/19/2017 Time: 1235-1300 SLP Time Calculation (min) (ACUTE ONLY): 25 min  Assessment / Plan / Recommendation Clinical Impression  Pt today more confused than she was during prior visits,with limited understanding of anything beyond her basic needs.    Pt observed sitting in bed and coughing up secretions that were blue/green in color - ? Mixed with medications?  RN reports pt received medications @ approximately 1030 and then underwent chest PT - ?  if pt has retained medications in esophagus causing regurgitation/expectoration?  Advised not to conduct chest PT soon after eating to prevent recurrence.  Pt denies difficulty swallowing medications and RN reports pt given full juice after medications with puree.   SLP observed pt with nectar thickened cranberry juice - no indication of dysphagia/compromise of the airway.  Pt initially stated "I don't like that stuff" but after first bolus she stated "That's not bad" and proceeded to finish 4 ounces of nectar juice.    Reviewed prior MBS pt underwent in 2017- not barium tablet appeared to lodge at [place] - and ? If pt may have had some component of esophageal dysmotility as esophagus appeared slow to clear.  No documentation from radiologist re: esophageal sweep.  Pt is against undergoing repeat MBS or dedicated esophageal evaluation and per review of prior notes - her daughter did not want her to undergo MBS either.  Suspect chronic nature of dysphagia and highly suspect aspiration occurring - concern for possible primary esophageal deficits present.    Recommend continue diet with strict precautions including consumption of liquids during meal, staying upright for at least 30 minutes after, not conducting Chest PT for at least 60 minutes after po/meals!   Advised RN -  Will follow up next week to help in care plan.   Consideration for palliative meeting may be helpful given pt will NOT eat on puree diet and aspiration risk ongoing regardless of diet.   Informed spouse in hallway re: pt's status and recommendation for no chest PT immediately after meals.     HPI HPI: Pt is a 83 y.o. female with PMH of COPD/bronchiectasis, Parkinson's disease, Paget bone disease, osteoporosis, history of colon cancer was brought to the ER by the family on 6/27 due to concerns of abnormal breath sounds and decreased mentation. Pt had a productive cough for 2 weeks with greenish mucus. Reportedly concerns for aspiration at baseline. CXR 6/27 showed LLL infiltrate with L side pleural effusion. Pt had MBS 11/08/15 showing prolonged mastication, brief oral holding, pharyngeal residuals; dysphagia 2/ thin liquid diet/ meds crushed was recommended. Currently on dysphagia 1/ nectar thick liquid diet. Palliative following. Bedside swallow eval ordered.      SLP Plan  Continue with current plan of care       Recommendations  Diet recommendations: Dysphagia 3 (mechanical soft);Nectar-thick liquid (pt declines to participate in instrumental swallow evaluation) Liquids provided via: Cup;Straw Medication Administration: Whole meds with puree (start and follow with nectar) Supervision: Full supervision/cueing for compensatory strategies Compensations: Minimize environmental distractions;Slow rate;Small sips/bites Postural Changes and/or Swallow Maneuvers: Seated upright 90 degrees;Upright 30-60 min after meal                Oral Care Recommendations: Oral care BID;Oral care prior to ice chip/H20 Follow up Recommendations: Skilled Nursing facility SLP Visit Diagnosis: Dysphagia, oropharyngeal phase (R13.12) Plan: Continue with current plan of care  [place], [facility] SLP [phone]  Macario Golds 01/19/2017, 1:04 PM

## 2017-01-20 DIAGNOSIS — F0390 Unspecified dementia without behavioral disturbance: Secondary | ICD-10-CM

## 2017-01-20 LAB — BASIC METABOLIC PANEL
Anion gap: 6 (ref 5–15)
BUN: 16 mg/dL (ref 6–20)
CHLORIDE: 103 mmol/L (ref 101–111)
CO2: 31 mmol/L (ref 22–32)
CREATININE: 0.6 mg/dL (ref 0.44–1.00)
Calcium: 7.9 mg/dL — ABNORMAL LOW (ref 8.9–10.3)
GFR calc non Af Amer: >60 mL/min (ref >60)
Glucose, Bld: 91 mg/dL (ref 65–99)
Potassium: 3.8 mmol/L (ref 3.5–5.1)
Sodium: 140 mmol/L (ref 135–145)

## 2017-01-20 LAB — CBC
HCT: 29 % — ABNORMAL LOW (ref 36.0–46.0)
HEMOGLOBIN: 9.1 g/dL — AB (ref 12.0–15.0)
MCH: 28.5 pg (ref 26.0–34.0)
MCHC: 31.4 g/dL (ref 30.0–36.0)
MCV: 90.9 fL (ref 78.0–100.0)
PLATELETS: 394 10*3/uL (ref 150–400)
RBC: 3.19 MIL/uL — AB (ref 3.87–5.11)
RDW: 16.4 % — ABNORMAL HIGH (ref 11.5–15.5)
WBC: 6.4 10*3/uL (ref 4.0–10.5)

## 2017-01-20 LAB — GLUCOSE, CAPILLARY: Glucose-Capillary: 83 mg/dL (ref 65–99)

## 2017-01-20 MED ORDER — GUAIFENESIN-DM 100-10 MG/5ML PO SYRP: 5.0000 mL | ORAL_SOLUTION | ORAL | Status: DC | PRN

## 2017-01-20 MED ORDER — MAGIC MOUTHWASH
2.0000 mL | Freq: Three times a day (TID) | ORAL | Status: DC
  Administered 2017-01-21 – 2017-01-24 (×11): 2 mL via ORAL
  Filled 2017-01-20 (×15): qty 5

## 2017-01-20 MED ORDER — DM-GUAIFENESIN ER 30-600 MG PO TB12: 1.0000 | ORAL_TABLET | Freq: Two times a day (BID) | ORAL | Status: DC

## 2017-01-20 NOTE — Clinical Social Work Note (Signed)
Review of chart indicates that patient will hopefully be stable for d/c tomorrow to [facility].  Pt was seen by Palliative Care services on 01/11/17.  She is now a DNR; Form placed on front of chart for MD's signature.  CSW services will continue to support and assist with d/c when medically stable per MD.  Butch Penny T. Pauline Good, [place]  (weekend coverage)

## 2017-01-20 NOTE — Progress Notes (Signed)
Patient ID: Shannon Dunn, female   DOB: 07-29-1923, 83 y.o.   MRN: [id number]  PROGRESS NOTE    Shannon Dunn  VHQ:[id number] DOB: [birth year] DOA: 01/10/2017  PCP: Renata Caprice, DO   Brief Narrative:  83 year old female with history of COPD and bronchiectasis, Parkinson's, Paget's bone disease and colon ca who presented to ED from memory care unit due to cough productive of greenish sputum, fevers and decreased mentation. Pt was found to have pneumonia and extensive mucus plugging. She was found to have pseudomonas in sputum. Due to worsening left lung opacity pulmonary consulted, changed abx to fortaz on 7/5 and added tobramycin. Family does not want thoracentesis or aggressive care.  Assessment & Plan:   Active Problems: Acute respiratory failure with hypoxia in the setting of acute COPD exacerbation and bronchiectasis, mucus plugging and HCAP / Pseudomonas in sputum - CXR on 7/2 showed almost complete consolidation left thorax with sparring of the left lung apex. Mediastinal shift to left, may reflect atelectasis from mucous plug or central obstructing lesion - Repeat CXR 7/5 showed persistent left lung opacity not significantly changed - Sputum cultures grew Pseudomonas species - Pulmonary saw the patient in consultation, changed antibiotics to [facility] on 01/18/2017 which needs to be continued for total of 7 days - Continue tobramycin, started 01/18/2017 - continue for total of 14 days - Continue chest physiotherapy - Appt with pulm 7/27 at 10:45am   Parkinson's disease ([place]) - Stable  Hypothyroidism  - Continue Synthroid  Hypokalemia - Supplemented and within normal limits  Essential hypertension - Blood pressure at goal - Continue hydralazine 10 mg every 8 hours  Malnutrition of moderate degree - In the context of chronic illness, dementia - Diet as tolerated  Dementia - Stable - No changes in mental status  Forehead lesion - Pt will have outpt follow  up  DVT prophylaxis: SCD's Code Status: DNR/DNI Family Communication: daughter at bedside this am Disposition Plan: To SNF once completes [place]    Consultants:   PCT  Pulmonary - 7/5  SLP  Procedures:   BiPAP  Antimicrobials:   Vanco 6/27 - 7/3  Cefepime 6/27 - 6/28  Zosyn 6/28 --> 7/5  Tressie Ellis 7/5 -->   Subjective: No overnight events.   Objective: Vitals:   01/19/17 1414 01/19/17 2041 01/20/17 0545 01/20/17 0749  BP: (!) 122/58 (!) 125/55 (!) 146/60   Pulse: 93 88 82   Resp: 18 18 18    Temp: 99 F (37.2 C) 98.7 F (37.1 C) 97.9 F (36.6 C)   TempSrc: Oral Oral Oral   SpO2: 100% 98% 99% 99%  Weight:      Height:       No intake or output data in the 24 hours ending 01/20/17 0924 Filed Weights   01/10/17 1703 01/12/17 0252 01/15/17 0610  Weight: 39 kg (85 lb 15.7 oz) 40.1 kg (88 lb 6.5 oz) 38.3 kg (84 lb 7 oz)    Examination:  Physical Exam  Constitutional: Appears well-developed and well-nourished. No distress.  CVS: RRR, S1/S2 + Pulmonary: diminished on left, no wheezing  Abdominal: Soft. BS +,  no distension, tenderness, rebound or guarding.  Musculoskeletal: No edema and no tenderness.  Lymphadenopathy: No lymphadenopathy noted, cervical, inguinal. Neuro: No focal deficits  Skin: ecchymoses on UE Psychiatric: Normal mood and affect.     Data Reviewed: I have personally reviewed following labs and imaging studies  CBC:  Recent Labs Lab 01/15/17 0336 01/17/17 0249 01/18/17 4132 01/19/17 4401  01/20/17 0539  WBC 8.4 8.3 7.7 6.5 6.4  HGB 9.6* 9.5* 9.8* 9.5* 9.1*  HCT 30.4* 30.2* 31.1* 30.0* 29.0*  MCV 93.0 91.5 91.7 90.6 90.9  PLT 339 359 388 380 371   Basic Metabolic Panel:  Recent Labs Lab 01/14/17 0340  01/16/17 0315 01/17/17 0249 01/18/17 0528 01/19/17 0525 01/20/17 0539  NA 143  --   --  137 138 141 140  K 3.8  --   --  3.4* 3.5 3.4* 3.8  CL 110  --   --  99* 100* 104 103  CO2 27  --   --  32 28 29 31    GLUCOSE 119*  --   --  88 90 84 91  BUN 9  --   --  16 18 18 16   CREATININE 0.64  < > 0.66 0.76 0.79 0.62 0.60  CALCIUM 7.4*  --   --  7.8* 8.3* 7.9* 7.9*  < > = values in this interval not displayed. GFR: Estimated Creatinine Clearance: 28.3 mL/min (by C-G formula based on SCr of 0.6 mg/dL). Liver Function Tests: No results for input(s): AST, ALT, ALKPHOS, BILITOT, PROT, ALBUMIN in the last 168 hours. No results for input(s): LIPASE, AMYLASE in the last 168 hours. No results for input(s): AMMONIA in the last 168 hours. Coagulation Profile: No results for input(s): INR, PROTIME in the last 168 hours. Cardiac Enzymes: No results for input(s): CKTOTAL, CKMB, CKMBINDEX, TROPONINI in the last 168 hours. BNP (last 3 results) No results for input(s): PROBNP in the last 8760 hours. HbA1C: No results for input(s): HGBA1C in the last 72 hours. CBG:  Recent Labs Lab 01/16/17 1252 01/17/17 0754 01/18/17 0727 01/19/17 0729 01/20/17 0727  GLUCAP 109* 94 90 84 83   Lipid Profile: No results for input(s): CHOL, HDL, LDLCALC, TRIG, CHOLHDL, LDLDIRECT in the last 72 hours. Thyroid Function Tests: No results for input(s): TSH, T4TOTAL, FREET4, T3FREE, THYROIDAB in the last 72 hours. Anemia Panel: No results for input(s): VITAMINB12, FOLATE, FERRITIN, TIBC, IRON, RETICCTPCT in the last 72 hours. Urine analysis:    Component Value Date/Time   COLORURINE YELLOW 01/11/2017 [place] 01/11/2017 0643   LABSPEC 1.012 01/11/2017 0643   PHURINE 5.0 01/11/2017 0643   GLUCOSEU NEGATIVE 01/11/2017 0643   HGBUR NEGATIVE 01/11/2017 0643   BILIRUBINUR NEGATIVE 01/11/2017 0643   KETONESUR NEGATIVE 01/11/2017 0643   PROTEINUR NEGATIVE 01/11/2017 0643   UROBILINOGEN 0.2 04/09/2015 1355   NITRITE NEGATIVE 01/11/2017 0643   LEUKOCYTESUR NEGATIVE 01/11/2017 0643   Sepsis Labs: @LABRCNTIP (procalcitonin:4,lacticidven:4)   ) Recent Results (from the past 240 hour(s))  Culture, blood  (routine x 2)     Status: None   Collection Time: 01/10/17  1:17 PM  Result Value Ref Range Status   Specimen Description BLOOD RIGHT ANTECUBITAL  Final   Special Requests   Final    BOTTLES DRAWN AEROBIC AND ANAEROBIC Blood Culture adequate volume   Culture   Final    NO GROWTH 5 DAYS Performed at [facility], [place] [address], [place], [place] [address]    Report Status 01/15/2017 FINAL  Final  Culture, blood (routine x 2)     Status: None   Collection Time: 01/10/17  5:22 PM  Result Value Ref Range Status   Specimen Description BLOOD LEFT ARM  Final   Special Requests   Final    BOTTLES DRAWN AEROBIC AND ANAEROBIC Blood Culture adequate volume   Culture   Final  NO GROWTH 5 DAYS Performed at [facility], [place] [address], [place], [place] [address]    Report Status 01/15/2017 FINAL  Final  MRSA PCR Screening     Status: None   Collection Time: 01/10/17  8:00 PM  Result Value Ref Range Status   MRSA by PCR NEGATIVE NEGATIVE Final    Comment:        The GeneXpert MRSA Assay (FDA approved for NASAL specimens only), is one component of a comprehensive MRSA colonization surveillance program. It is not intended to diagnose MRSA infection nor to guide or monitor treatment for MRSA infections.   Culture, expectorated sputum-assessment     Status: None   Collection Time: 01/13/17 12:21 PM  Result Value Ref Range Status   Specimen Description SPUTUM  Final   Special Requests NONE  Final   Sputum evaluation THIS SPECIMEN IS ACCEPTABLE FOR SPUTUM CULTURE  Final   Report Status 01/13/2017 FINAL  Final  Culture, respiratory (NON-Expectorated)     Status: None   Collection Time: 01/13/17 12:25 PM  Result Value Ref Range Status   Specimen Description SPUTUM  Final   Special Requests NONE  Final   Gram Stain   Final    ABUNDANT WBC PRESENT, PREDOMINANTLY PMN RARE GRAM POSITIVE COCCI IN PAIRS RARE GRAM NEGATIVE RODS RARE GRAM POSITIVE RODS Performed at [facility], [address], [place], [place] [address]    Culture MODERATE PSEUDOMONAS AERUGINOSA  Final   Report Status 01/17/2017 FINAL  Final   Organism ID, Bacteria PSEUDOMONAS AERUGINOSA  Final      Susceptibility   Pseudomonas aeruginosa - MIC*    CEFTAZIDIME <=1 SENSITIVE Sensitive     CIPROFLOXACIN <=0.25 SENSITIVE Sensitive     GENTAMICIN <=1 SENSITIVE Sensitive     IMIPENEM 1 SENSITIVE Sensitive     CEFEPIME <=1 SENSITIVE Sensitive     * MODERATE PSEUDOMONAS AERUGINOSA      Radiology Studies: Dg Chest Port 1 View  Result Date: 01/19/2017 CLINICAL DATA:  History of bronchiectasis and COPD. Shortness of breath. Pneumonia. EXAM: PORTABLE CHEST 1 VIEW COMPARISON:  01/18/2017. FINDINGS: Remote right rib trauma. Osteopenia. Patient chin overlies the left apex. Patient rotated left. Mild volume loss in the left hemithorax. Heart size not well evaluated. Atherosclerosis in the transverse aorta. Progressive whiteout of the left hemithorax. No pneumothorax. Probable trace right pleural fluid. Clear right lung. IMPRESSION: Progressive, now complete whiteout of the left hemithorax. This likely due to a combination of pleural fluid and airspace disease. Given progression since 01/10/2017, mucous plugging in the left endobronchial tree is also a consideration. Aortic Atherosclerosis (ICD10-I70.0). Electronically Signed   By: Abigail Miyamoto M.D.   On: 01/19/2017 09:40   Dg Chest Port 1 View  Result Date: 01/18/2017 CLINICAL DATA:  Hx of bronchiectasis. Hx of COPD. Pt is very SOB. Weakness. Not taking HTN meds. Not diabetic. Nonsmoker. No hx of CHF or AFIB. EXAM: PORTABLE CHEST 1 VIEW COMPARISON:  01/15/2017 FINDINGS: The patient's chin obscures the upper chest. There is persistent significant opacity throughout the left lung, with sparing of the lung apex. There is evidence for left lung volume loss. The appearance is similar to the prior study. Small right pleural effusion persists. Remote rib fractures  are noted. Old right humerus fracture. IMPRESSION: Persistent significant opacity in the left lung, not significantly changed. Electronically Signed   By: Nolon Nations M.D.   On: 01/18/2017 11:08     Scheduled Meds: . albuterol  2.5 mg Nebulization QID  . Carbidopa-Levodopa ER  1 capsule Oral QID  . guaiFENesin  1,200 mg Oral BID  . hydrALAZINE  10 mg Oral Q8H  . levothyroxine  25 mcg Oral QAC breakfast  . potassium chloride  40 mEq Oral Once  . rasagiline  1 mg Oral Q1400  . rivastigmine  13.3 mg Transdermal Daily  . rotigotine  1 patch Transdermal Daily  . saccharomyces boulardii  250 mg Oral BID  . sodium chloride HYPERTONIC  4 mL Nebulization BID  . tobramycin (PF)  300 mg Nebulization BID   Continuous Infusions: . cefTAZidime (FORTAZ)  IV Stopped (01/19/17 1854)     LOS: 10 days    Time spent: 25 minutes  Greater than 50% of the time spent on counseling and coordinating the care.   Leisa Lenz, MD [facility] Pager [phone]  If 7PM-7AM, please contact night-coverage [email or web address] Password [email or web address] 01/20/2017, 9:24 AM

## 2017-01-21 LAB — GLUCOSE, CAPILLARY: Glucose-Capillary: 71 mg/dL (ref 65–99)

## 2017-01-21 MED ORDER — IPRATROPIUM-ALBUTEROL 0.5-2.5 (3) MG/3ML IN SOLN
RESPIRATORY_TRACT | Status: AC
  Filled 2017-01-21: qty 3

## 2017-01-21 NOTE — Progress Notes (Signed)
Patient ID: Shannon Dunn, female   DOB: 06-19-1924, 83 y.o.   MRN: [id number]  PROGRESS NOTE    Shannon Dunn  IOE:[id number] DOB: Jul 09, 1924 DOA: 01/10/2017  PCP: Renata Caprice, DO   Brief Narrative:  83 year old female with history of COPD and bronchiectasis, Parkinson's, Paget's bone disease and colon ca who presented to ED from memory care unit due to cough productive of greenish sputum, fevers and decreased mentation. Pt was found to have pneumonia and extensive mucus plugging. She was found to have pseudomonas in sputum. Due to worsening left lung opacity pulmonary consulted, changed abx to fortaz on 7/5 and added tobramycin. Family does not want thoracentesis or aggressive care.  Assessment & Plan:   Active Problems: Acute respiratory failure with hypoxia in the setting of acute COPD exacerbation and bronchiectasis, mucus plugging and HCAP / Pseudomonas in sputum - CXR on 7/2 showed almost complete consolidation left thorax with sparring of the left lung apex. Mediastinal shift to left, may reflect atelectasis from mucous plug or central obstructing lesion - Repeat CXR 7/5 showed persistent left lung opacity not significantly changed - Sputum cx grew pseudomonas species - Per pulm, continue fortaz for total of 7 days, end day would be 7/11 - Continue tobramycin started 7/5, for 7 days, end day would be 7/11 - Continue chest PT - Appt with pulm 7/27 at 10:45 am   Parkinson's disease ([place]) - Stable   Hypothyroidism  - Continue synthroid   Hypokalemia - Supplemented   Essential hypertension - Continue hydralazine 10 mg every 8 hours - BP stable, at goal  Malnutrition of moderate degree - In the context of chronic illness, dementia - Diet as tolerated   Dementia - Stable   Forehead lesion - Outpt follow up  DVT prophylaxis: SCD's Code Status: DNR/DNI Family Communication: daughter at the bedside Disposition Plan: to SNF likely Wednesday once abx  completed    Consultants:   PCT  Pulmonary  SLP  Procedures:   Bipap  Antimicrobials:   Vanco 6/27 - 7/3  Cefepime 6/27 - 6/28  Zosyn 6/28 -->7/5  Shannon Dunn 7/5 --> 7/11   Subjective: No overnight events.  Objective: Vitals:   01/21/17 0811 01/21/17 1127 01/21/17 1322 01/21/17 1429  BP:   115/67   Pulse:   (!) 101   Resp:   20 20  Temp:    98.9 F (37.2 C)  TempSrc:    Oral  SpO2:  96% 98%   Weight: 37.2 kg (82 lb)     Height:        Intake/Output Summary (Last 24 hours) at 01/21/17 1553 Last data filed at 01/21/17 1324  Gross per 24 hour  Intake               50 ml  Output              700 ml  Net             -650 ml   Filed Weights   01/15/17 0610 01/20/17 1335 01/21/17 0811  Weight: 38.3 kg (84 lb 7 oz) 38.3 kg (84 lb 7 oz) 37.2 kg (82 lb)    Examination:  General exam: Appears calm and comfortable  Respiratory system: diminished breath sounds, no wheezing  Cardiovascular system: S1 & S2 heard, Rate controlled  Gastrointestinal system: Abdomen is nondistended, soft and nontender. No organomegaly or masses felt. Normal bowel sounds heard. Central nervous system: Alert and oriented. No focal neurological deficits. Extremities:  Symmetric 5 x 5 power. Skin: No rashes, lesions or ulcers Psychiatry: Judgement and insight appear normal.   Data Reviewed: I have personally reviewed following labs and imaging studies  CBC:  Recent Labs Lab 01/15/17 0336 01/17/17 0249 01/18/17 0528 01/19/17 0525 01/20/17 0539  WBC 8.4 8.3 7.7 6.5 6.4  HGB 9.6* 9.5* 9.8* 9.5* 9.1*  HCT 30.4* 30.2* 31.1* 30.0* 29.0*  MCV 93.0 91.5 91.7 90.6 90.9  PLT 339 359 388 380 948   Basic Metabolic Panel:  Recent Labs Lab 01/16/17 0315 01/17/17 0249 01/18/17 0528 01/19/17 0525 01/20/17 0539  NA  --  137 138 141 140  K  --  3.4* 3.5 3.4* 3.8  CL  --  99* 100* 104 103  CO2  --  32 28 29 31   GLUCOSE  --  88 90 84 91  BUN  --  16 18 18 16   CREATININE 0.66 0.76  0.79 0.62 0.60  CALCIUM  --  7.8* 8.3* 7.9* 7.9*   GFR: Estimated Creatinine Clearance: 27.4 mL/min (by C-G formula based on SCr of 0.6 mg/dL). Liver Function Tests: No results for input(s): AST, ALT, ALKPHOS, BILITOT, PROT, ALBUMIN in the last 168 hours. No results for input(s): LIPASE, AMYLASE in the last 168 hours. No results for input(s): AMMONIA in the last 168 hours. Coagulation Profile: No results for input(s): INR, PROTIME in the last 168 hours. Cardiac Enzymes: No results for input(s): CKTOTAL, CKMB, CKMBINDEX, TROPONINI in the last 168 hours. BNP (last 3 results) No results for input(s): PROBNP in the last 8760 hours. HbA1C: No results for input(s): HGBA1C in the last 72 hours. CBG:  Recent Labs Lab 01/17/17 0754 01/18/17 0727 01/19/17 0729 01/20/17 0727 01/21/17 0723  GLUCAP 94 90 84 83 71   Lipid Profile: No results for input(s): CHOL, HDL, LDLCALC, TRIG, CHOLHDL, LDLDIRECT in the last 72 hours. Thyroid Function Tests: No results for input(s): TSH, T4TOTAL, FREET4, T3FREE, THYROIDAB in the last 72 hours. Anemia Panel: No results for input(s): VITAMINB12, FOLATE, FERRITIN, TIBC, IRON, RETICCTPCT in the last 72 hours. Urine analysis:    Component Value Date/Time   COLORURINE YELLOW 01/11/2017 [place] 01/11/2017 0643   LABSPEC 1.012 01/11/2017 0643   PHURINE 5.0 01/11/2017 0643   GLUCOSEU NEGATIVE 01/11/2017 0643   HGBUR NEGATIVE 01/11/2017 0643   BILIRUBINUR NEGATIVE 01/11/2017 0643   KETONESUR NEGATIVE 01/11/2017 0643   PROTEINUR NEGATIVE 01/11/2017 0643   UROBILINOGEN 0.2 04/09/2015 1355   NITRITE NEGATIVE 01/11/2017 0643   LEUKOCYTESUR NEGATIVE 01/11/2017 0643   Sepsis Labs: @LABRCNTIP (procalcitonin:4,lacticidven:4)   ) Recent Results (from the past 240 hour(s))  Culture, expectorated sputum-assessment     Status: None   Collection Time: 01/13/17 12:21 PM  Result Value Ref Range Status   Specimen Description SPUTUM  Final    Special Requests NONE  Final   Sputum evaluation THIS SPECIMEN IS ACCEPTABLE FOR SPUTUM CULTURE  Final   Report Status 01/13/2017 FINAL  Final  Culture, respiratory (NON-Expectorated)     Status: None   Collection Time: 01/13/17 12:25 PM  Result Value Ref Range Status   Specimen Description SPUTUM  Final   Special Requests NONE  Final   Gram Stain   Final    ABUNDANT WBC PRESENT, PREDOMINANTLY PMN RARE GRAM POSITIVE COCCI IN PAIRS RARE GRAM NEGATIVE RODS RARE GRAM POSITIVE RODS Performed at [facility], [place] [address] [place], [place] [address]    Culture MODERATE PSEUDOMONAS AERUGINOSA  Final   Report  Status 01/17/2017 FINAL  Final   Organism ID, Bacteria PSEUDOMONAS AERUGINOSA  Final      Susceptibility   Pseudomonas aeruginosa - MIC*    CEFTAZIDIME <=1 SENSITIVE Sensitive     CIPROFLOXACIN <=0.25 SENSITIVE Sensitive     GENTAMICIN <=1 SENSITIVE Sensitive     IMIPENEM 1 SENSITIVE Sensitive     CEFEPIME <=1 SENSITIVE Sensitive     * MODERATE PSEUDOMONAS AERUGINOSA      Radiology Studies: Dg Chest Port 1 View  Result Date: 01/19/2017 CLINICAL DATA:  History of bronchiectasis and COPD. Shortness of breath. Pneumonia. EXAM: PORTABLE CHEST 1 VIEW COMPARISON:  01/18/2017. FINDINGS: Remote right rib trauma. Osteopenia. Patient chin overlies the left apex. Patient rotated left. Mild volume loss in the left hemithorax. Heart size not well evaluated. Atherosclerosis in the transverse aorta. Progressive whiteout of the left hemithorax. No pneumothorax. Probable trace right pleural fluid. Clear right lung. IMPRESSION: Progressive, now complete whiteout of the left hemithorax. This likely due to a combination of pleural fluid and airspace disease. Given progression since 01/10/2017, mucous plugging in the left endobronchial tree is also a consideration. Aortic Atherosclerosis (ICD10-I70.0). Electronically Signed   By: Abigail Miyamoto M.D.   On: 01/19/2017 09:40   Dg Chest Port 1  View  Result Date: 01/18/2017 CLINICAL DATA:  Hx of bronchiectasis. Hx of COPD. Pt is very SOB. Weakness. Not taking HTN meds. Not diabetic. Nonsmoker. No hx of CHF or AFIB. EXAM: PORTABLE CHEST 1 VIEW COMPARISON:  01/15/2017 FINDINGS: The patient's chin obscures the upper chest. There is persistent significant opacity throughout the left lung, with sparing of the lung apex. There is evidence for left lung volume loss. The appearance is similar to the prior study. Small right pleural effusion persists. Remote rib fractures are noted. Old right humerus fracture. IMPRESSION: Persistent significant opacity in the left lung, not significantly changed. Electronically Signed   By: Nolon Nations M.D.   On: 01/18/2017 11:08        Scheduled Meds: . albuterol  2.5 mg Nebulization QID  . Carbidopa-Levodopa ER  1 capsule Oral QID  . hydrALAZINE  10 mg Oral Q8H  . ipratropium-albuterol      . levothyroxine  25 mcg Oral QAC breakfast  . magic mouthwash  2 mL Oral TID  . potassium chloride  40 mEq Oral Once  . rasagiline  1 mg Oral Q1400  . rivastigmine  13.3 mg Transdermal Daily  . rotigotine  1 patch Transdermal Daily  . saccharomyces boulardii  250 mg Oral BID  . tobramycin (PF)  300 mg Nebulization BID   Continuous Infusions: . cefTAZidime (FORTAZ)  IV Stopped (01/20/17 1855)     LOS: 11 days    Time spent: 25 minutes  Greater than 50% of the time spent on counseling and coordinating the care.   Leisa Lenz, MD [facility] Pager [phone]  If 7PM-7AM, please contact night-coverage [email or web address] Password [email or web address] 01/21/2017, 3:53 PM

## 2017-01-21 NOTE — Progress Notes (Signed)
Holding CPT at this time. RN wants to hold at this time.

## 2017-01-22 LAB — BASIC METABOLIC PANEL
Anion gap: 4 — ABNORMAL LOW (ref 5–15)
BUN: 11 mg/dL (ref 6–20)
CALCIUM: 7.9 mg/dL — AB (ref 8.9–10.3)
CO2: 30 mmol/L (ref 22–32)
CREATININE: 0.62 mg/dL (ref 0.44–1.00)
Chloride: 105 mmol/L (ref 101–111)
GFR calc Af Amer: >60 mL/min (ref >60)
GLUCOSE: 87 mg/dL (ref 65–99)
POTASSIUM: 3.7 mmol/L (ref 3.5–5.1)
SODIUM: 139 mmol/L (ref 135–145)

## 2017-01-22 LAB — CBC
HCT: 28.5 % — ABNORMAL LOW (ref 36.0–46.0)
Hemoglobin: 9 g/dL — ABNORMAL LOW (ref 12.0–15.0)
MCH: 28.9 pg (ref 26.0–34.0)
MCHC: 31.6 g/dL (ref 30.0–36.0)
MCV: 91.6 fL (ref 78.0–100.0)
PLATELETS: 438 10*3/uL — AB (ref 150–400)
RBC: 3.11 MIL/uL — AB (ref 3.87–5.11)
RDW: 16.8 % — AB (ref 11.5–15.5)
WBC: 6.8 10*3/uL (ref 4.0–10.5)

## 2017-01-22 LAB — GLUCOSE, CAPILLARY: GLUCOSE-CAPILLARY: 79 mg/dL (ref 65–99)

## 2017-01-22 MED ORDER — ALBUTEROL SULFATE (2.5 MG/3ML) 0.083% IN NEBU
2.5000 mg | INHALATION_SOLUTION | Freq: Three times a day (TID) | RESPIRATORY_TRACT | Status: DC
  Administered 2017-01-22 – 2017-01-25 (×8): 2.5 mg via RESPIRATORY_TRACT
  Filled 2017-01-22 (×8): qty 3

## 2017-01-22 MED ORDER — LOPERAMIDE HCL 1 MG/5ML PO LIQD
1.0000 mg | ORAL | Status: DC | PRN
  Administered 2017-01-22: 1 mg via ORAL
  Filled 2017-01-22: qty 5

## 2017-01-22 NOTE — Progress Notes (Signed)
PCCM will follow up in am 7/10.  Plan for repeat CXR in am for review of left opacification.     Noe Gens, NP-C [facility] Pgr: [phone] or if no answer [phone] 01/22/2017, 4:04 PM

## 2017-01-22 NOTE — Progress Notes (Signed)
Physical Therapy Treatment Patient Details Name: Shannon Dunn MRN: [id number] DOB: Oct 22, 1923 Today's Date: 01/22/2017    History of Present Illness Shannon Dunn a 83 y.o.femalewith medical history significant of COPD/bronchiectasis, ORIF hip, Parkinson's disease, Paget bone disease, osteoporosis, history of colon cancer who was brought to the ER from [facility] [facility] by the family due to concerns of abnormal breath sounds and decreased mentation. History is per family due to patient's mental status.    PT Comments    The patient was more alert today. Attempted standing, Assisted to [facility] with max of 2 assist. Plans SNF soon.   Follow Up Recommendations  SNF     Equipment Recommendations  None recommended by PT    Recommendations for Other Services       Precautions / Restrictions Precautions Precaution Comments: incontinence    Mobility  Bed Mobility Overal bed mobility: Needs Assistance Bed Mobility: Rolling Rolling: Min assist   Supine to sit: Max assist;+2 for physical assistance;+2 for safety/equipment Sit to supine: +2 for safety/equipment;+2 for physical assistance;Max assist   General bed mobility comments: multi-modal cues for attention to task ; rolls  and reaches for rail,+2 for trunk  and  legs  Transfers Overall transfer level: Needs assistance Equipment used: Rolling walker (2 wheeled) Transfers: Sit to/from [employer] Sit to Stand: +2 physical assistance;+2 safety/equipment;Max assist Stand pivot transfers: Max assist;+2 physical assistance;+2 safety/equipment       General transfer comment: stood from bed x 4 with RW, feet tending toslide forward and patient stepped sideways each time, buttocks behind, pushing backward.  Squat pivot to [facility] and back to bed.  Ambulation/Gait                 Stairs            Wheelchair Mobility    Modified Rankin (Stroke Patients Only)       Balance Overall balance  assessment: Needs assistance;History of Falls Sitting-balance support: Feet supported Sitting balance-Leahy Scale: Poor Sitting balance - Comments: sits near midline   Standing balance support: Bilateral upper extremity supported Standing balance-Leahy Scale: Zero                              Cognition Arousal/Alertness: Awake/alert   Overall Cognitive Status: History of cognitive impairments - at baseline                                        Exercises      General Comments        Pertinent Vitals/Pain Pain Assessment: Faces Faces Pain Scale: Hurts little more Pain Location: right side of back Pain Descriptors / Indicators: Discomfort;Grimacing;Guarding Pain Intervention(s): Monitored during session    Home Living                      Prior Function            PT Goals (current goals can now be found in the care plan section) Progress towards PT goals: Progressing toward goals    Frequency    Min 2X/week      PT Plan Current plan remains appropriate    Co-evaluation              AM-PAC PT "6 Clicks" Daily Activity  Outcome Measure  Difficulty turning over in  bed (including adjusting bedclothes, sheets and blankets)?: Total Difficulty moving from lying on back to sitting on the side of the bed? : Total Difficulty sitting down on and standing up from a chair with arms (e.g., wheelchair, bedside commode, etc,.)?: Total Help needed moving to and from a bed to chair (including a wheelchair)?: Total Help needed walking in hospital room?: Total Help needed climbing 3-5 steps with a railing? : Total 6 Click Score: 6    End of Session   Activity Tolerance: Patient tolerated treatment well Patient left: in bed;with call bell/phone within reach;with bed alarm set;with family/visitor present Nurse Communication: Mobility status PT Visit Diagnosis: Other abnormalities of gait and mobility (R26.89)     Time:  2481-8590 PT Time Calculation (min) (ACUTE ONLY): 17 min  Charges:  $Therapeutic Activity: 8-22 mins                    G CodesTresa Endo PT [phone]    Shannon Dunn 01/22/2017, 5:43 PM

## 2017-01-22 NOTE — Progress Notes (Signed)
The daughter said that she didn't want her mother to have the CPT and that she would make sure she does her flutter.. RT will continue to monitor.

## 2017-01-22 NOTE — Care Management Important Message (Signed)
Important Message  Patient Details  Name: MAYTHE DERAMO MRN: [id number] Date of Birth: 1923-11-14   Medicare Important Message Given:  Yes    Kerin Salen 01/22/2017, 10:47 AMImportant Message  Patient Details  Name: JESENYA BOWDITCH MRN: [id number] Date of Birth: 1923-11-29   Medicare Important Message Given:  Yes    Kerin Salen 01/22/2017, 10:47 AM

## 2017-01-22 NOTE — Progress Notes (Signed)
Nutrition Follow-up  DOCUMENTATION CODES:   Non-severe (moderate) malnutrition in context of chronic illness  INTERVENTION:   Continue Magic Cup TID with meals per protocol, each supplement provides 290 kcal and 9 grams of protein   Continue Mighty Shake II BID in Health Touch, each supplement provides 500 kcal and 23 grams of protein. Is appropriate for nectar-thick need.  NUTRITION DIAGNOSIS:   Malnutrition (moderate/non-severe) related to chronic illness (COPD, Parkinson's, advanced age) as evidenced by mild depletion of body fat, mild depletion of muscle mass, moderate depletions of muscle mass. -ongoing  GOAL:   Patient will meet greater than or equal to 90% of their needs   Improving   MONITOR:   PO intake, Supplement acceptance, Weight trends, Labs  ASSESSMENT:   83 year old female with history of COPD and bronchiectasis, Parkinson's, Paget's bone disease and colon ca who presented to ED from memory care unit due to cough productive of greenish sputum, fevers and decreased mentation. Pt was found to have pneumonia and extensive mucus plugging. She was found to have pseudomonas in sputum. Due to worsening left lung opacity pulmonary consulted, changed abx to fortaz on 7/5 and added tobramycin. Family does not want thoracentesis or aggressive care.  Pt with improved appetite; ate 100% of breakfast today which included an omelet and 8oz milk. Pt getting Magic Cups and Mighty shakes. Per MD note family does not want aggressive care. Continue to encourage intake of meals and supplements. Per chart, pt is weight stable. Pt to discharge to SNF once antibiotics completed.   Diet Order:  DIET DYS 3 Room service appropriate? Yes with Assist; Fluid consistency: Nectar Thick  Skin:  Reviewed, no issues  Last BM:  7/7  Height:   Ht Readings from Last 1 Encounters:  01/10/17 4\' 9"  (1.448 m)    Weight:   Wt Readings from Last 1 Encounters:  01/21/17 82 lb (37.2 kg)    Ideal  Body Weight:  41.45 kg  BMI:  Body mass index is 17.74 kg/m.  Estimated Nutritional Needs:   Kcal:  1090-1250 (28-32 kcal/kg)  Protein:  40-50 grams  Fluid:  >/= 1.2 L/day  EDUCATION NEEDS:   No education needs identified at this time  Koleen Distance MS, RD, [place] Pager #- [phone] After Hours Pager: [phone]

## 2017-01-22 NOTE — Progress Notes (Addendum)
Patient ID: Shannon Dunn, female   DOB: [birth year], 83 y.o.   MRN: [id number]  PROGRESS NOTE    Shannon Dunn  [facility]:[id number] DOB: 31-Aug-1923 DOA: 01/10/2017  PCP: Renata Caprice, DO   Brief Narrative:  83 year old female with history of COPD and bronchiectasis, Parkinson's, Paget's bone disease and colon ca who presented to ED from memory care unit due to cough productive of greenish sputum, fevers and decreased mentation. Pt was found to have pneumonia and extensive mucus plugging. She was found to have pseudomonas in sputum. Due to worsening left lung opacity pulmonary consulted, changed abx to fortaz on 7/5 and added tobramycin. Family does not want thoracentesis or aggressive care.  Assessment & Plan:   Active Problems: Acute respiratory failure with hypoxia in the setting of acute COPD exacerbation and bronchiectasis, mucus plugging and HCAP / Pseudomonas in sputum - CXR on 7/2 showed almost complete consolidation left thorax with sparring of the left lung apex. Mediastinal shift to left, may reflect atelectasis from mucous plug or central obstructing lesion - CXR 7/5 showed persistent left lung opacity not significantly changed - Sputum cx grew pseudomonas species - Continue Fortaz for 7 days and tobramycin for 7 days, this is due to end 7/11 - Continue flutter valve - Appt with pulm 7/27 at 10:45 am  Parkinson's disease ([place]) - Stable  Hypothyroidism  - Continue synthroid   Hypokalemia - Supplemented   Essential hypertension - Continue hydralazine 10 mg every 8 hours - BP at goal  Malnutrition of moderate degree - In the context of chronic illness, dementia - Diet as tolerated   Dementia - Stable  Forehead lesion - Outpt follow, was supposed to be removed but pt ended up in hospital so family to resch appt with derm  DVT prophylaxis: SCD's Code Status: DNR/DNI Family Communication: daughter at the bedside  Disposition Plan: to SNF once abx competed     Consultants:   PCT  Pulmonary  SLP  Procedures:   Bipap  Antimicrobials:   Vanco 6/27 - 7/3  Cefepime 6/27 - 6/28  Zosyn 6/28 -->7/5  Tressie Ellis 7/5 --> 7/11   Subjective: No overnight events.  Objective: Vitals:   01/21/17 2025 01/21/17 2049 01/22/17 0536 01/22/17 0756  BP:  121/63 (!) 130/50   Pulse:  99 73   Resp:  20 20   Temp:  98.4 F (36.9 C) 98.2 F (36.8 C)   TempSrc:  Oral Axillary   SpO2: 98% 98% 100% 96%  Weight:      Height:        Intake/Output Summary (Last 24 hours) at 01/22/17 1116 Last data filed at 01/22/17 0730  Gross per 24 hour  Intake              240 ml  Output              100 ml  Net              140 ml   Filed Weights   01/15/17 0610 01/20/17 1335 01/21/17 0811  Weight: 38.3 kg (84 lb 7 oz) 38.3 kg (84 lb 7 oz) 37.2 kg (82 lb)    Physical Exam  Constitutional: Appears thin. No distress.   CVS: RRR, S1/S2 +  Pulmonary: diminished breath sounds, no wheezing   Abdominal: Soft. BS +,  no distension, tenderness, rebound or guarding.  Musculoskeletal: Normal range of motion. No edema and no tenderness.  Lymphadenopathy: No lymphadenopathy noted, cervical, inguinal. Neuro: disoriented  but alert  Skin: Skin is warm and dry. Ecchymoses in UE Psychiatric: Normal mood and affect.     Data Reviewed: I have personally reviewed following labs and imaging studies  CBC:  Recent Labs Lab 01/17/17 0249 01/18/17 0528 01/19/17 0525 01/20/17 0539 01/22/17 0527  WBC 8.3 7.7 6.5 6.4 6.8  HGB 9.5* 9.8* 9.5* 9.1* 9.0*  HCT 30.2* 31.1* 30.0* 29.0* 28.5*  MCV 91.5 91.7 90.6 90.9 91.6  PLT 359 388 380 394 993*   Basic Metabolic Panel:  Recent Labs Lab 01/17/17 0249 01/18/17 0528 01/19/17 0525 01/20/17 0539 01/22/17 0527  NA 137 138 141 140 139  K 3.4* 3.5 3.4* 3.8 3.7  CL 99* 100* 104 103 105  CO2 32 28 29 31 30   GLUCOSE 88 90 84 91 87  BUN 16 18 18 16 11   CREATININE 0.76 0.79 0.62 0.60 0.62  CALCIUM 7.8* 8.3* 7.9*  7.9* 7.9*   GFR: Estimated Creatinine Clearance: 27.4 mL/min (by C-G formula based on SCr of 0.62 mg/dL). Liver Function Tests: No results for input(s): AST, ALT, ALKPHOS, BILITOT, PROT, ALBUMIN in the last 168 hours. No results for input(s): LIPASE, AMYLASE in the last 168 hours. No results for input(s): AMMONIA in the last 168 hours. Coagulation Profile: No results for input(s): INR, PROTIME in the last 168 hours. Cardiac Enzymes: No results for input(s): CKTOTAL, CKMB, CKMBINDEX, TROPONINI in the last 168 hours. BNP (last 3 results) No results for input(s): PROBNP in the last 8760 hours. HbA1C: No results for input(s): HGBA1C in the last 72 hours. CBG:  Recent Labs Lab 01/18/17 0727 01/19/17 0729 01/20/17 0727 01/21/17 0723 01/22/17 0739  GLUCAP 90 84 83 71 79   Lipid Profile: No results for input(s): CHOL, HDL, LDLCALC, TRIG, CHOLHDL, LDLDIRECT in the last 72 hours. Thyroid Function Tests: No results for input(s): TSH, T4TOTAL, FREET4, T3FREE, THYROIDAB in the last 72 hours. Anemia Panel: No results for input(s): VITAMINB12, FOLATE, FERRITIN, TIBC, IRON, RETICCTPCT in the last 72 hours. Urine analysis:    Component Value Date/Time   COLORURINE YELLOW 01/11/2017 [place] 01/11/2017 0643   LABSPEC 1.012 01/11/2017 0643   PHURINE 5.0 01/11/2017 0643   GLUCOSEU NEGATIVE 01/11/2017 0643   HGBUR NEGATIVE 01/11/2017 0643   BILIRUBINUR NEGATIVE 01/11/2017 0643   KETONESUR NEGATIVE 01/11/2017 0643   PROTEINUR NEGATIVE 01/11/2017 0643   UROBILINOGEN 0.2 04/09/2015 1355   NITRITE NEGATIVE 01/11/2017 0643   LEUKOCYTESUR NEGATIVE 01/11/2017 0643   Sepsis Labs: @LABRCNTIP (procalcitonin:4,lacticidven:4)   ) Recent Results (from the past 240 hour(s))  Culture, expectorated sputum-assessment     Status: None   Collection Time: 01/13/17 12:21 PM  Result Value Ref Range Status   Specimen Description SPUTUM  Final   Special Requests NONE  Final   Sputum  evaluation THIS SPECIMEN IS ACCEPTABLE FOR SPUTUM CULTURE  Final   Report Status 01/13/2017 FINAL  Final  Culture, respiratory (NON-Expectorated)     Status: None   Collection Time: 01/13/17 12:25 PM  Result Value Ref Range Status   Specimen Description SPUTUM  Final   Special Requests NONE  Final   Gram Stain   Final    ABUNDANT WBC PRESENT, PREDOMINANTLY PMN RARE GRAM POSITIVE COCCI IN PAIRS RARE GRAM NEGATIVE RODS RARE GRAM POSITIVE RODS Performed at [facility], [place] [address] [place], [place] [address]    Culture MODERATE PSEUDOMONAS AERUGINOSA  Final   Report Status 01/17/2017 FINAL  Final   Organism ID, Bacteria PSEUDOMONAS AERUGINOSA  Final      Susceptibility   Pseudomonas aeruginosa - MIC*    CEFTAZIDIME <=1 SENSITIVE Sensitive     CIPROFLOXACIN <=0.25 SENSITIVE Sensitive     GENTAMICIN <=1 SENSITIVE Sensitive     IMIPENEM 1 SENSITIVE Sensitive     CEFEPIME <=1 SENSITIVE Sensitive     * MODERATE PSEUDOMONAS AERUGINOSA      Radiology Studies: Dg Chest Port 1 View  Result Date: 01/19/2017 CLINICAL DATA:  History of bronchiectasis and COPD. Shortness of breath. Pneumonia. EXAM: PORTABLE CHEST 1 VIEW COMPARISON:  01/18/2017. FINDINGS: Remote right rib trauma. Osteopenia. Patient chin overlies the left apex. Patient rotated left. Mild volume loss in the left hemithorax. Heart size not well evaluated. Atherosclerosis in the transverse aorta. Progressive whiteout of the left hemithorax. No pneumothorax. Probable trace right pleural fluid. Clear right lung. IMPRESSION: Progressive, now complete whiteout of the left hemithorax. This likely due to a combination of pleural fluid and airspace disease. Given progression since 01/10/2017, mucous plugging in the left endobronchial tree is also a consideration. Aortic Atherosclerosis (ICD10-I70.0). Electronically Signed   By: Abigail Miyamoto M.D.   On: 01/19/2017 09:40        Scheduled Meds: . albuterol  2.5 mg Nebulization QID    . Carbidopa-Levodopa ER  1 capsule Oral QID  . hydrALAZINE  10 mg Oral Q8H  . levothyroxine  25 mcg Oral QAC breakfast  . magic mouthwash  2 mL Oral TID  . potassium chloride  40 mEq Oral Once  . rasagiline  1 mg Oral Q1400  . rivastigmine  13.3 mg Transdermal Daily  . rotigotine  1 patch Transdermal Daily  . saccharomyces boulardii  250 mg Oral BID  . tobramycin (PF)  300 mg Nebulization BID   Continuous Infusions: . cefTAZidime (FORTAZ)  IV Stopped (01/21/17 1755)     LOS: 12 days    Time spent: 15 minutes  Greater than 50% of the time spent on counseling and coordinating the care.   Leisa Lenz, MD [facility] Pager [phone]  If 7PM-7AM, please contact night-coverage [email or web address] Password [email or web address] 01/22/2017, 11:16 AM

## 2017-01-23 ENCOUNTER — Inpatient Hospital Stay (HOSPITAL_COMMUNITY): Payer: Medicare Other

## 2017-01-23 LAB — GLUCOSE, CAPILLARY: GLUCOSE-CAPILLARY: 76 mg/dL (ref 65–99)

## 2017-01-23 NOTE — Progress Notes (Signed)
Patient's oxygen weaned to room air.  Oxygen saturations were 94-95 percent on room air.

## 2017-01-23 NOTE — Progress Notes (Signed)
CSW following for discharge SNF. CSW spoke with patient daughter she is still agreeable with [employer]. CSW updated liaison at [facility].   Kathrin Greathouse, Latanya Presser, MSW Clinical Social Worker 5E and Psychiatric Service Line [phone] 01/23/2017  11:08 AM

## 2017-01-23 NOTE — Care Management Note (Signed)
Case Management Note  Patient Details  Name: ALLIEN MELBERG MRN: [id number] Date of Birth: 06/01/1924  Subjective/Objective: 83 y/o f admitted w/HCAP. From home. PT recc SNF. CSW already following.                   Action/Plan:d/c plan SNF.   Expected Discharge Date:   (UNKNOWN)               Expected Discharge Plan:  Skilled Nursing Facility  In-House Referral:  Clinical Social Work  Discharge planning Services  CM Consult  Post Acute Care Choice:    Choice offered to:     DME Arranged:    DME Agency:     HH Arranged:    [place] Agency:     Status of Service:  In process, will continue to follow  If discussed at Long Length of Stay Meetings, dates discussed:    Additional Comments:  Dessa Phi, RN 01/23/2017, 2:03 PM

## 2017-01-23 NOTE — Progress Notes (Signed)
Patient ID: Shannon Dunn, female   DOB: 1923-11-02, 83 y.o.   MRN: [id number]  PROGRESS NOTE    Shannon Dunn  VOH:[id number] DOB: March 28, 1924 DOA: 01/10/2017  PCP: Renata Caprice, DO   Brief Narrative:  83 year old female with history of COPD and bronchiectasis, Parkinson's, Paget's bone disease and colon ca who presented to ED from memory care unit due to cough productive of greenish sputum, fevers and decreased mentation. Pt was found to have pneumonia and extensive mucus plugging. She was found to have pseudomonas in sputum. Due to worsening left lung opacity pulmonary consulted, changed abx to fortaz on 7/5 and added tobramycin. Family does not want thoracentesis or aggressive care.   Assessment & Plan:   Active Problems: Acute respiratory failure with hypoxia in the setting of acute COPD exacerbation and bronchiectasis, mucus plugging and HCAP / Pseudomonas in sputum - CXR on 7/2 showed almost complete consolidation left thorax with sparring of the left lung apex. Mediastinal shift to left, may reflect atelectasis from mucous plug or central obstructing lesion - CXR 7/5 showed persistent left lung opacity not significantly changed - Pseudomonas on sputum cx - Continue fortaz, total of 7 days, should finish 7/11 - Stop tobramycin today per pulmonary - Continue flutter valve - CXR 7/10 showed extensive consolidation throughout much the left lung, now with aeration of a portion of the left upper lobe. There remain shift of heart and mediastinum toward the left.  - Has appt with pulmonary 7/27 at 10:45 am  Parkinson's disease ([place]) - Stable  - Continue carbidopa/levodopa  Hypothyroidism  - Continue synthroid   Hypokalemia - Supplemented and WNL  Essential hypertension - Continue hydralazine   Malnutrition of moderate degree - In the context of chronic illness, dementia - Diet as tolerated   Dementia - Stable   Forehead lesion - Outpt follow, was supposed to be  removed but pt ended up in hospital so family to resch appt with derm   DVT prophylaxis: SCD's Code Status: DNR/DNI Family Communication: daughter at bedside Disposition Plan: to SNF once abx completed; likely tomorrow or Thursday depending on when is the last dose of fortaz   Consultants:   PCT  Pulmonary  SLP  Procedures:   BiPAP  Antimicrobials:   Vanco 6/27 - 7/3  Cefepime 6/27 - 6/28  Zosyn 6/28 -->7/5  Tressie Ellis 7/5 --> 7/11   Subjective: No overnight events.  Objective: Vitals:   01/22/17 2025 01/22/17 2215 01/23/17 0539 01/23/17 0731  BP:  131/61 104/79   Pulse:  96 92   Resp:  20 20   Temp:  98.2 F (36.8 C) 97.6 F (36.4 C)   TempSrc:  Oral Oral   SpO2: 98% 95% 100% 96%  Weight:      Height:        Intake/Output Summary (Last 24 hours) at 01/23/17 1111 Last data filed at 01/23/17 0830  Gross per 24 hour  Intake              240 ml  Output                0 ml  Net              240 ml   Filed Weights   01/15/17 0610 01/20/17 1335 01/21/17 0811  Weight: 38.3 kg (84 lb 7 oz) 38.3 kg (84 lb 7 oz) 37.2 kg (82 lb)    Examination:  General exam: Appears calm and comfortable  Respiratory system:  diminished breath sounds, no wheezing  Cardiovascular system: S1 & S2 heard, Rate controlled  Gastrointestinal system: Abdomen is nondistended, soft and nontender. No organomegaly or masses felt. Normal bowel sounds heard. Central nervous system: Alert and oriented. No focal neurological deficits. Extremities: Symmetric 5 x 5 power. Skin: ecchymosis on UE Psychiatry: Judgement and insight appear normal. Mood & affect appropriate.   Data Reviewed: I have personally reviewed following labs and imaging studies  CBC:  Recent Labs Lab 01/17/17 0249 01/18/17 0528 01/19/17 0525 01/20/17 0539 01/22/17 0527  WBC 8.3 7.7 6.5 6.4 6.8  HGB 9.5* 9.8* 9.5* 9.1* 9.0*  HCT 30.2* 31.1* 30.0* 29.0* 28.5*  MCV 91.5 91.7 90.6 90.9 91.6  PLT 359 388 380 394  696*   Basic Metabolic Panel:  Recent Labs Lab 01/17/17 0249 01/18/17 0528 01/19/17 0525 01/20/17 0539 01/22/17 0527  NA 137 138 141 140 139  K 3.4* 3.5 3.4* 3.8 3.7  CL 99* 100* 104 103 105  CO2 32 28 29 31 30   GLUCOSE 88 90 84 91 87  BUN 16 18 18 16 11   CREATININE 0.76 0.79 0.62 0.60 0.62  CALCIUM 7.8* 8.3* 7.9* 7.9* 7.9*   GFR: Estimated Creatinine Clearance: 27.4 mL/min (by C-G formula based on SCr of 0.62 mg/dL). Liver Function Tests: No results for input(s): AST, ALT, ALKPHOS, BILITOT, PROT, ALBUMIN in the last 168 hours. No results for input(s): LIPASE, AMYLASE in the last 168 hours. No results for input(s): AMMONIA in the last 168 hours. Coagulation Profile: No results for input(s): INR, PROTIME in the last 168 hours. Cardiac Enzymes: No results for input(s): CKTOTAL, CKMB, CKMBINDEX, TROPONINI in the last 168 hours. BNP (last 3 results) No results for input(s): PROBNP in the last 8760 hours. HbA1C: No results for input(s): HGBA1C in the last 72 hours. CBG:  Recent Labs Lab 01/19/17 0729 01/20/17 0727 01/21/17 0723 01/22/17 0739 01/23/17 0724  GLUCAP 84 83 71 79 76   Lipid Profile: No results for input(s): CHOL, HDL, LDLCALC, TRIG, CHOLHDL, LDLDIRECT in the last 72 hours. Thyroid Function Tests: No results for input(s): TSH, T4TOTAL, FREET4, T3FREE, THYROIDAB in the last 72 hours. Anemia Panel: No results for input(s): VITAMINB12, FOLATE, FERRITIN, TIBC, IRON, RETICCTPCT in the last 72 hours. Urine analysis:    Component Value Date/Time   COLORURINE YELLOW 01/11/2017 [place] 01/11/2017 0643   LABSPEC 1.012 01/11/2017 0643   PHURINE 5.0 01/11/2017 0643   GLUCOSEU NEGATIVE 01/11/2017 0643   HGBUR NEGATIVE 01/11/2017 0643   BILIRUBINUR NEGATIVE 01/11/2017 0643   KETONESUR NEGATIVE 01/11/2017 0643   PROTEINUR NEGATIVE 01/11/2017 0643   UROBILINOGEN 0.2 04/09/2015 1355   NITRITE NEGATIVE 01/11/2017 0643   LEUKOCYTESUR NEGATIVE  01/11/2017 0643   Sepsis Labs: @LABRCNTIP (procalcitonin:4,lacticidven:4)   ) Recent Results (from the past 240 hour(s))  Culture, expectorated sputum-assessment     Status: None   Collection Time: 01/13/17 12:21 PM  Result Value Ref Range Status   Specimen Description SPUTUM  Final   Special Requests NONE  Final   Sputum evaluation THIS SPECIMEN IS ACCEPTABLE FOR SPUTUM CULTURE  Final   Report Status 01/13/2017 FINAL  Final  Culture, respiratory (NON-Expectorated)     Status: None   Collection Time: 01/13/17 12:25 PM  Result Value Ref Range Status   Specimen Description SPUTUM  Final   Special Requests NONE  Final   Gram Stain   Final    ABUNDANT WBC PRESENT, PREDOMINANTLY PMN RARE GRAM POSITIVE COCCI IN PAIRS RARE GRAM  NEGATIVE RODS RARE GRAM POSITIVE RODS Performed at [facility], [place] [address], [place], [place] [address]    Culture MODERATE PSEUDOMONAS AERUGINOSA  Final   Report Status 01/17/2017 FINAL  Final   Organism ID, Bacteria PSEUDOMONAS AERUGINOSA  Final      Susceptibility   Pseudomonas aeruginosa - MIC*    CEFTAZIDIME <=1 SENSITIVE Sensitive     CIPROFLOXACIN <=0.25 SENSITIVE Sensitive     GENTAMICIN <=1 SENSITIVE Sensitive     IMIPENEM 1 SENSITIVE Sensitive     CEFEPIME <=1 SENSITIVE Sensitive     * MODERATE PSEUDOMONAS AERUGINOSA      Radiology Studies: Dg Chest Port 1 View  Result Date: 01/23/2017 CLINICAL DATA:  Pneumonia EXAM: PORTABLE CHEST 1 VIEW COMPARISON:  January 19, 2017 FINDINGS: There is extensive airspace consolidation throughout the left mid lower lung zones. Compared to most recent study, there is now aeration of a portion of the left upper lobe. Right lung is mildly hyperexpanded but clear. There is shift of heart and mediastinum toward the left, stable. The heart size is within normal limits. Visualized pulmonary vascular is normal. There is aortic atherosclerosis. No evident adenopathy. Bones are osteoporotic. There is marked  arthropathy in the right shoulder. IMPRESSION: Extensive consolidation throughout much the left lung, now with aeration of a portion of the left upper lobe. There remain shift of heart and mediastinum toward the left. Right lung hyperexpanded but clear. Stable cardiac silhouette. There is aortic atherosclerosis. There is advanced arthropathy in the right shoulder with chronic rotator cuff tear, characterized by superior migration of the right humeral head. Aortic Atherosclerosis (ICD10-I70.0). Electronically Signed   By: Lowella Grip III M.D.   On: 01/23/2017 07:11        Scheduled Meds: . albuterol  2.5 mg Nebulization TID  . Carbidopa-Levodopa ER  1 capsule Oral QID  . hydrALAZINE  10 mg Oral Q8H  . levothyroxine  25 mcg Oral QAC breakfast  . magic mouthwash  2 mL Oral TID  . potassium chloride  40 mEq Oral Once  . rasagiline  1 mg Oral Q1400  . rivastigmine  13.3 mg Transdermal Daily  . rotigotine  1 patch Transdermal Daily  . saccharomyces boulardii  250 mg Oral BID   Continuous Infusions: . cefTAZidime (FORTAZ)  IV Stopped (01/22/17 1752)     LOS: 13 days    Time spent: 25 minutes  Greater than 50% of the time spent on counseling and coordinating the care.   Leisa Lenz, MD [facility] Pager [phone]  If 7PM-7AM, please contact night-coverage [email or web address] Password [email or web address] 01/23/2017, 11:11 AM

## 2017-01-23 NOTE — Progress Notes (Signed)
BP 104/76, hospitalist notified, instructed to hold this morning's dose of apresoline

## 2017-01-23 NOTE — Progress Notes (Signed)
Name: Shannon Dunn MRN: [id number] DOB: 1924-04-27    ADMISSION DATE:  01/10/2017 CONSULTATION DATE:  01/18/17  REFERRING MD :  Dr. Charlies Silvers   CHIEF COMPLAINT:  Cough, Fever, AMS   BRIEF SUMMARY:  83 y/o F, never smoker, who resides in a memory care unit with Parkinson's Disease, COPD & Bronchiectasis who presented to [facility] on [facility]/27 with reports of increased congestion, altered mental status & shortness of breath.  Found to have L PNA with small pleural effusion.  Progressed to opacification of the the left hemithorax with mediastinal shift to the left.  Cultures grew pseudomonas which was pan-sensitive (ceftazidime, cipro, sensitivities not done on zosyn).  ABX changed on 7/5 to ceftazidime.     SUBJECTIVE:  Daughter reports pt coughing up thick green secretions with flutter valve.  Pt more alert, eating breakfast.    VITAL SIGNS: Temp:  [97.6 F (36.4 C)-98.3 F (36.8 C)] 97.6 F (36.4 C) (07/10 0539) Pulse Rate:  [92-96] 92 (07/10 0539) Resp:  [20] 20 (07/10 0539) BP: (104-131)/(56-79) 104/79 (07/10 0539) SpO2:  [95 %-100 %] 96 % (07/10 0731)  PHYSICAL EXAMINATION: General:  Frail elderly female in NAD, appears much more alert on today's exam HEENT: MM pink/moist, no jvd, fair dentition  PSY: calm/ Neuro: baseline dementia, pleasant CV: s1s2 rrr, no m/r/g PULM: even/non-labored, diminished on L, clear on R ME:QAST, non-tender, bsx4 active  Extremities: warm/dry, no edema  Skin: no rashes, central forehead raised scabbed lesion (?skin ca), multiple old scars   Recent Labs Lab 01/19/17 0525 01/20/17 0539 01/22/17 0527  NA 141 140 139  K 3.4* 3.8 3.7  CL 104 103 105  CO2 29 31 30   BUN 18 16 11   CREATININE 0.62 0.60 0.62  GLUCOSE 84 91 87     Recent Labs Lab 01/19/17 0525 01/20/17 0539 01/22/17 0527  HGB 9.5* 9.1* 9.0*  HCT 30.0* 29.0* 28.5*  WBC 6.5 6.4 6.8  PLT 380 394 438*    Dg Chest Port 1 View  Result Date: 01/23/2017 CLINICAL DATA:  Pneumonia  EXAM: PORTABLE CHEST 1 VIEW COMPARISON:  January 19, 2017 FINDINGS: There is extensive airspace consolidation throughout the left mid lower lung zones. Compared to most recent study, there is now aeration of a portion of the left upper lobe. Right lung is mildly hyperexpanded but clear. There is shift of heart and mediastinum toward the left, stable. The heart size is within normal limits. Visualized pulmonary vascular is normal. There is aortic atherosclerosis. No evident adenopathy. Bones are osteoporotic. There is marked arthropathy in the right shoulder. IMPRESSION: Extensive consolidation throughout much the left lung, now with aeration of a portion of the left upper lobe. There remain shift of heart and mediastinum toward the left. Right lung hyperexpanded but clear. Stable cardiac silhouette. There is aortic atherosclerosis. There is advanced arthropathy in the right shoulder with chronic rotator cuff tear, characterized by superior migration of the right humeral head. Aortic Atherosclerosis (ICD10-I70.0). Electronically Signed   By: Lowella Grip III M.D.   On: 01/23/2017 07:11      SIGNIFICANT EVENTS  6/27  Admit with fever, AMS, L opacity 7/05  PCCM consulted, abx changed  STUDIES: 7/05  CXR >> modest improvement in L opacification 7/06  CXR >> near complete opacification of left side with mediastinal shift to L   ASSESSMENT / PLAN:  Discussion: 83 y/o F with advanced Parkinson's disease / dementia, obstructive bronchiectasis / COPD admitted with L PNA. She was found to  have pseudomonas in the sputum which is pan-sensitive.  Unfortunately, she has had poor clearance of L PNA despite antibiotic therapy. She recently has had a change in her Parkinson's medications and is being woke up in the middle of the night for medication which may be contributing to a component of aspiration.  In addition, she likes to lay on her left side.  Possible component of effusion on initial film, small volume  fluid on bedside US assessment (7/5).  Family requests to defer thora.     Left Pseudomonas PNA Acute Hypoxic Respiratory Failure  Concern for Aspiration   Plan: Discontinue Tobi nebs  Complete ceftazidime 7/11  Wean O2 to off for sats > 90%.  She may need 2L in the short term while PNA resolves.   Continue aggressive pulmonary hygiene - flutter, chest PT as pt tolerates (frail!) Intermittent CXR Frequent turnting with R side down  Aspiration precautions - D3 diet  Follow up with Dr. Ashok Cordia as scheduled > see d/c summary for details  GOC:  See prior discussion Family:  Daughter Shannon Dunn) updated at bedside by Dr. Lamonte Sakai.     PCCM will be available as needed.  Please call if new needs arise.   Noe Gens, NP-C [facility] Pgr: [phone] or if no answer [phone] 01/23/2017, 9:39 AM  Attending Note:  I have examined patient, reviewed labs, studies and notes. I have discussed the case with B Ollis, and I agree with the data and plans as amended above. 83 year old woman with a history of Parkinson's, COPD, bronchiectasis, and suspected dysphagia. She was admitted with a left lower lobe pneumonia. Course comp complicated by poor secretion clearance and left-sided mucous plugging, volume loss. Culture data revealed a pansensitive Pseudomonas. She's been treated with appropriate antibiotics. Her left-sided volume loss has improved with pulmonary hygiene and the addition of tobramycin nebulizer. Today she is more awake, interactive, has improved mentation. She is eating modified diet and tolerating. She is using a flutter valve with good results. Would complete her IV antibiotics course. Stop tobramycin nebulizer today. Continue to push pulmonary hygiene. We will attempt to discontinue her oxygen and see if she desaturates. She may require oxygen as an outpatient.   Baltazar Apo, MD, PhD 01/23/2017, 10:14 AM [facility] [phone] or if no answer  [phone]

## 2017-01-24 LAB — CBC
HCT: 30.2 % — ABNORMAL LOW (ref 36.0–46.0)
Hemoglobin: 9.4 g/dL — ABNORMAL LOW (ref 12.0–15.0)
MCH: 28.3 pg (ref 26.0–34.0)
MCHC: 31.1 g/dL (ref 30.0–36.0)
MCV: 91 fL (ref 78.0–100.0)
PLATELETS: 505 10*3/uL — AB (ref 150–400)
RBC: 3.32 MIL/uL — AB (ref 3.87–5.11)
RDW: 17 % — AB (ref 11.5–15.5)
WBC: 7 10*3/uL (ref 4.0–10.5)

## 2017-01-24 LAB — BASIC METABOLIC PANEL
ANION GAP: 7 (ref 5–15)
BUN: 15 mg/dL (ref 6–20)
CALCIUM: 8.6 mg/dL — AB (ref 8.9–10.3)
CO2: 31 mmol/L (ref 22–32)
Chloride: 101 mmol/L (ref 101–111)
Creatinine, Ser: 0.64 mg/dL (ref 0.44–1.00)
Glucose, Bld: 86 mg/dL (ref 65–99)
POTASSIUM: 3.7 mmol/L (ref 3.5–5.1)
SODIUM: 139 mmol/L (ref 135–145)

## 2017-01-24 LAB — GLUCOSE, CAPILLARY: GLUCOSE-CAPILLARY: 78 mg/dL (ref 65–99)

## 2017-01-24 MED ORDER — HYDRALAZINE HCL 10 MG PO TABS
10.0000 mg | ORAL_TABLET | Freq: Two times a day (BID) | ORAL | Status: DC
  Filled 2017-01-24: qty 1

## 2017-01-24 NOTE — Progress Notes (Signed)
Patient ID: Shannon Dunn, female   DOB: 11-Mar-1924, 83 y.o.   MRN: [id number]  PROGRESS NOTE    Shannon Dunn  YKD:[id number] DOB: 1924/03/01 DOA: 01/10/2017  PCP: Renata Caprice, DO   Brief Narrative:  83 year old female with history of COPD and bronchiectasis, Parkinson's, Paget's bone disease and colon ca who presented to ED from memory care unit due to cough productive of greenish sputum, fevers and decreased mentation. Pt was found to have pneumonia and extensive mucus plugging. She was found to have pseudomonas in sputum. Due to worsening left lung opacity pulmonary consulted, changed abx to fortaz on 7/5 and added tobramycin. Family does not want thoracentesis or aggressive care.   Assessment & Plan:    Active Problems: Acute respiratory failure with hypoxia in the setting of acute COPD exacerbation and bronchiectasis, mucus plugging and HCAP / Pseudomonas in sputum - CXR on 7/2 showed almost complete consolidation left thorax with sparring of the left lung apex. Mediastinal shift to left, may reflect atelectasis from mucous plug or central obstructing lesion - CXR 7/5 showed persistent left lung opacity not significantly changed - Pseudomonas on sputum cx - Continue flutter valve - CXR 7/10 showed extensive consolidation throughout much the left lung, now with aeration of a portion of the left upper lobe. There remain shift of heart and mediastinum toward the left.  - Has appt with pulmonary 7/27 at 10:45 am - Tobramycin stopped 7/10 - Last dose of fortaz today at 6 pm - D/ C to SNF In am  Parkinson's disease ([place]) - Continue carbidopa/levodopa  Hypothyroidism  - Continue synthroid   Hypokalemia - Supplemented  Essential hypertension - Continue hydralazine   Malnutrition of moderate degree - In the context of chronic illness, dementia - Diet as tolerated   Dementia - Stable  Forehead lesion - Outpt follow, was supposed to be removed but pt ended up in  hospital so family to resch appt with derm   DVT prophylaxis: SCD's Code Status: DNR/DNI Family Communication: no family at the bedside  Disposition Plan: to SNF in am   Consultants:   PCT  Pulmonary  SLP  Procedures:   BiPAP  Antimicrobials:   Vanco 6/27 - 7/3  Cefepime 6/27 - 6/28  Zosyn 6/28 -->7/5  Tressie Ellis 7/5 --> 7/11   Subjective: No overnight events.   Objective: Vitals:   01/23/17 2016 01/23/17 2018 01/24/17 0622 01/24/17 1021  BP: (!) 132/54  (!) 128/50   Pulse: 93  92   Resp: 16  16   Temp: 97.6 F (36.4 C)  98.3 F (36.8 C)   TempSrc: Axillary  Axillary   SpO2: 94% 94% 95% 99%  Weight:      Height:        Intake/Output Summary (Last 24 hours) at 01/24/17 1300 Last data filed at 01/24/17 0900  Gross per 24 hour  Intake              120 ml  Output              400 ml  Net             -280 ml   Filed Weights   01/15/17 0610 01/20/17 1335 01/21/17 0811  Weight: 38.3 kg (84 lb 7 oz) 38.3 kg (84 lb 7 oz) 37.2 kg (82 lb)    Examination:  General exam: Appears calm and comfortable  Respiratory system: diminished breath sounds, no wheezing  Cardiovascular system: S1 & S2 heard,  Rate controlled  Gastrointestinal system: Abdomen is nondistended, soft and nontender. No organomegaly or masses felt. Normal bowel sounds heard. Central nervous system: No focal neurological deficits. Extremities: Symmetric 5 x 5 power. Skin: ecchymosis UE Psychiatry: Normal mood and behavior   Data Reviewed: I have personally reviewed following labs and imaging studies  CBC:  Recent Labs Lab 01/18/17 0528 01/19/17 0525 01/20/17 0539 01/22/17 0527 01/24/17 0809  WBC 7.7 6.5 6.4 6.8 7.0  HGB 9.8* 9.5* 9.1* 9.0* 9.4*  HCT 31.1* 30.0* 29.0* 28.5* 30.2*  MCV 91.7 90.6 90.9 91.6 91.0  PLT 388 380 394 438* 097*   Basic Metabolic Panel:  Recent Labs Lab 01/18/17 0528 01/19/17 0525 01/20/17 0539 01/22/17 0527 01/24/17 0809  NA 138 141 140 139  139  K 3.5 3.4* 3.8 3.7 3.7  CL 100* 104 103 105 101  CO2 28 29 31 30 31   GLUCOSE 90 84 91 87 86  BUN 18 18 16 11 15   CREATININE 0.79 0.62 0.60 0.62 0.64  CALCIUM 8.3* 7.9* 7.9* 7.9* 8.6*   GFR: Estimated Creatinine Clearance: 27.4 mL/min (by C-G formula based on SCr of 0.64 mg/dL). Liver Function Tests: No results for input(s): AST, ALT, ALKPHOS, BILITOT, PROT, ALBUMIN in the last 168 hours. No results for input(s): LIPASE, AMYLASE in the last 168 hours. No results for input(s): AMMONIA in the last 168 hours. Coagulation Profile: No results for input(s): INR, PROTIME in the last 168 hours. Cardiac Enzymes: No results for input(s): CKTOTAL, CKMB, CKMBINDEX, TROPONINI in the last 168 hours. BNP (last 3 results) No results for input(s): PROBNP in the last 8760 hours. HbA1C: No results for input(s): HGBA1C in the last 72 hours. CBG:  Recent Labs Lab 01/20/17 0727 01/21/17 0723 01/22/17 0739 01/23/17 0724 01/24/17 0730  GLUCAP 83 71 79 76 78   Lipid Profile: No results for input(s): CHOL, HDL, LDLCALC, TRIG, CHOLHDL, LDLDIRECT in the last 72 hours. Thyroid Function Tests: No results for input(s): TSH, T4TOTAL, FREET4, T3FREE, THYROIDAB in the last 72 hours. Anemia Panel: No results for input(s): VITAMINB12, FOLATE, FERRITIN, TIBC, IRON, RETICCTPCT in the last 72 hours. Urine analysis:    Component Value Date/Time   COLORURINE YELLOW 01/11/2017 [place] 01/11/2017 0643   LABSPEC 1.012 01/11/2017 0643   PHURINE 5.0 01/11/2017 0643   GLUCOSEU NEGATIVE 01/11/2017 0643   HGBUR NEGATIVE 01/11/2017 0643   BILIRUBINUR NEGATIVE 01/11/2017 0643   KETONESUR NEGATIVE 01/11/2017 0643   PROTEINUR NEGATIVE 01/11/2017 0643   UROBILINOGEN 0.2 04/09/2015 1355   NITRITE NEGATIVE 01/11/2017 0643   LEUKOCYTESUR NEGATIVE 01/11/2017 0643   Sepsis Labs: @LABRCNTIP (procalcitonin:4,lacticidven:4)   )No results found for this or any previous visit (from the past 240  hour(s)).    Radiology Studies: Dg Chest Port 1 View  Result Date: 01/23/2017 CLINICAL DATA:  Pneumonia EXAM: PORTABLE CHEST 1 VIEW COMPARISON:  January 19, 2017 FINDINGS: There is extensive airspace consolidation throughout the left mid lower lung zones. Compared to most recent study, there is now aeration of a portion of the left upper lobe. Right lung is mildly hyperexpanded but clear. There is shift of heart and mediastinum toward the left, stable. The heart size is within normal limits. Visualized pulmonary vascular is normal. There is aortic atherosclerosis. No evident adenopathy. Bones are osteoporotic. There is marked arthropathy in the right shoulder. IMPRESSION: Extensive consolidation throughout much the left lung, now with aeration of a portion of the left upper lobe. There remain shift of heart and mediastinum toward  the left. Right lung hyperexpanded but clear. Stable cardiac silhouette. There is aortic atherosclerosis. There is advanced arthropathy in the right shoulder with chronic rotator cuff tear, characterized by superior migration of the right humeral head. Aortic Atherosclerosis (ICD10-I70.0). Electronically Signed   By: Lowella Grip III M.D.   On: 01/23/2017 07:11        Scheduled Meds: . albuterol  2.5 mg Nebulization TID  . Carbidopa-Levodopa ER  1 capsule Oral QID  . hydrALAZINE  10 mg Oral Q8H  . levothyroxine  25 mcg Oral QAC breakfast  . magic mouthwash  2 mL Oral TID  . rasagiline  1 mg Oral Q1400  . rivastigmine  13.3 mg Transdermal Daily  . rotigotine  1 patch Transdermal Daily  . saccharomyces boulardii  250 mg Oral BID   Continuous Infusions: . cefTAZidime (FORTAZ)  IV 1 g (01/23/17 1728)     LOS: 14 days    Time spent: 15 minutes  Greater than 50% of the time spent on counseling and coordinating the care.   Leisa Lenz, MD [facility] Pager [phone]  If 7PM-7AM, please contact night-coverage [email or web address] Password  [email or web address] 01/24/2017, 1:00 PM

## 2017-01-24 NOTE — Progress Notes (Signed)
SLP Cancellation Note  Patient Details Name: Shannon Dunn MRN: [id number] DOB: 1923-09-24   Cancelled treatment:       Reason Eval/Treat Not Completed: Other (comment) (pt currently sleeping, RN reports good tolerance of po, pt to dc tomorrow to SNF)   Luanna Salk, [place] [facility] SLP [phone]

## 2017-01-24 NOTE — Progress Notes (Signed)
  Notified MD of  Holding hydralazine due to diastolics in the 84F. Hydralazine dose adjusted.

## 2017-01-25 LAB — GLUCOSE, CAPILLARY: GLUCOSE-CAPILLARY: 70 mg/dL (ref 65–99)

## 2017-01-25 MED ORDER — HYDRALAZINE HCL 10 MG PO TABS: 10.0000 mg | ORAL_TABLET | Freq: Two times a day (BID) | ORAL | 0 refills | Status: AC

## 2017-01-25 MED ORDER — GUAIFENESIN-DM 100-10 MG/5ML PO SYRP: 5.0000 mL | ORAL_SOLUTION | ORAL | 0 refills | Status: AC | PRN

## 2017-01-25 MED ORDER — MAGIC MOUTHWASH: 2.0000 mL | Freq: Three times a day (TID) | ORAL | 0 refills | Status: AC

## 2017-01-25 MED ORDER — ALBUTEROL SULFATE (2.5 MG/3ML) 0.083% IN NEBU: 2.5000 mg | INHALATION_SOLUTION | Freq: Three times a day (TID) | RESPIRATORY_TRACT | 12 refills | Status: DC

## 2017-01-25 MED ORDER — ALBUTEROL SULFATE (2.5 MG/3ML) 0.083% IN NEBU: 2.5000 mg | INHALATION_SOLUTION | Freq: Two times a day (BID) | RESPIRATORY_TRACT | Status: DC

## 2017-01-25 MED ORDER — RESOURCE THICKENUP CLEAR PO POWD: 1.0000 | ORAL | 0 refills | Status: DC | PRN

## 2017-01-25 MED ORDER — ACETAMINOPHEN 650 MG RE SUPP: 650.0000 mg | RECTAL | 0 refills | Status: DC | PRN

## 2017-01-25 MED ORDER — SACCHAROMYCES BOULARDII 250 MG PO CAPS: 250.0000 mg | ORAL_CAPSULE | Freq: Two times a day (BID) | ORAL | 0 refills | Status: DC

## 2017-01-25 NOTE — Progress Notes (Signed)
Report called to [facility]. Pt transported via [facility] to [facility].

## 2017-01-25 NOTE — Discharge Summary (Addendum)
Physician Discharge Summary  Shannon Dunn UDJ:[id number] DOB: 06/21/1924 DOA: 01/10/2017  PCP: Renata Caprice, DO  Admit date: 01/10/2017 Discharge date: 01/25/2017  Recommendations for Outpatient Follow-up:  1. Check CBC and BMP per SNF protocol. 2. Continue bronchodilators as ordered. 3. Continue flutter valve   Discharge Diagnoses:  Active Problems:   Bronchiectasis without acute exacerbation (HCC)   Dementia   Parkinson's disease ([place])   HCAP (healthcare-associated pneumonia)   Acute respiratory failure with hypoxia (HCC)   Hypothyroidism   Malnutrition of moderate degree    Discharge Condition: stable   Diet recommendation: as tolerated   History of present illness:  83 year old female with history of COPD and bronchiectasis, Parkinson's, Paget's bone disease and colon ca who presented to ED from memory care unit due to cough productive of greenish sputum, fevers and decreased mentation. Pt was found to have pneumonia and extensive mucus plugging. She was found to have pseudomonas in sputum. Due to worsening left lung opacity pulmonary consulted, changed abx to fortaz on 7/5 and added tobramycin. Family does not want thoracentesis or aggressive care.   Hospital Course:   Active Problems: Acute respiratory failure with hypoxia in the setting of acute COPD exacerbation and bronchiectasis, mucus plugging and HCAP / Pseudomonas in sputum - CXR on 7/2 showed almost complete consolidation left thorax with sparring of the left lung apex. Mediastinal shift to left, may reflect atelectasis from mucous plug or central obstructing lesion - CXR 7/5 showed persistent left lung opacity not significantly changed - Pseudomonas on sputum cx - Continue flutter valve - CXR 7/10 showed extensive consolidation throughout much the left lung, now with aeration of a portion of the left upper lobe. There remain shift of heart and mediastinum toward the left.  - Has appt with pulmonary 7/27 at  10:45 am - Completed 7 days of fortaz and tobramycin   Parkinson's disease ([place]) - Continue carbidopa/levodopa  Hypothyroidism  - Continue synthroid   Hypokalemia - Supplemented  Essential hypertension - Continue hydralazine   Malnutrition of moderate degree - In the context of chronic illness, dementia - Diet as tolerated  Dementia - Stable   Forehead lesion - Outpt follow, was supposed to be removed but pt ended up in hospital so family to resch appt with derm   DVT prophylaxis:SCD's Code Status:DNR/DNI Family Communication:spoke with daughter at bedside 7/11, aware of discahrge for this am    Consultants:  PCT  Pulmonary  SLP  Procedures:  BiPAP  Antimicrobials:   Vanco 6/27 - 7/3  Cefepime 6/27 - 6/28  Zosyn 6/28 -->7/5  Tressie Ellis 7/5 --> 7/11  Signed:  Leisa Lenz, MD  [facility] 01/25/2017, 10:07 AM  Pager #: [phone]  Time spent in minutes: more than 30 minutes  Discharge Exam: Vitals:   01/25/17 0544 01/25/17 0811  BP: (!) 125/55   Pulse: 83 80  Resp: 14 16  Temp: 98.6 F (37 C)    Vitals:   01/24/17 1420 01/24/17 2043 01/25/17 0544 01/25/17 0811  BP: (!) 119/53 (!) 122/57 (!) 125/55   Pulse: 95 95 83 80  Resp: 18 16 14 16   Temp: 98.3 F (36.8 C) 98.1 F (36.7 C) 98.6 F (37 C)   TempSrc: Oral Oral Oral   SpO2: 93% 93% 96% 96%  Weight:      Height:        General: Pt is alert, not in acute distress Cardiovascular: Regular rate and rhythm, S1/S2 + Respiratory: diminished breath sounds, no wheezing  Abdominal: Soft, non tender, non distended, bowel sounds +, no guarding Extremities: ecchymosis on UE Neuro: Grossly nonfocal  Discharge Instructions  Discharge Instructions    Call MD for:  persistant nausea and vomiting    Complete by:  As directed    Call MD for:  redness, tenderness, or signs of infection (pain, swelling, redness, odor or green/yellow discharge around incision site)     Complete by:  As directed    Call MD for:  severe uncontrolled pain    Complete by:  As directed    Diet - low sodium heart healthy    Complete by:  As directed    Increase activity slowly    Complete by:  As directed      Allergies as of 01/25/2017      Reactions   Zithromax [azithromycin] Diarrhea   Iodine Other (See Comments)   Unknown, might be rash/hives   Ivp Dye [iodinated Diagnostic Agents] Other (See Comments)   Unknown rash/hives maybe   Shellfish Allergy Other (See Comments)   Unknown rash/hives, scallops       Medication List    TAKE these medications   acetaminophen 650 MG suppository Commonly known as:  TYLENOL Place 1 suppository (650 mg total) rectally every 4 (four) hours as needed for fever.   ACIDOPHILUS PROBIOTIC BLEND Caps Take 1 capsule by mouth daily with breakfast.   albuterol (2.5 MG/3ML) 0.083% nebulizer solution Commonly known as:  PROVENTIL Take 3 mLs (2.5 mg total) by nebulization every 6 (six) hours as needed for wheezing or shortness of breath. What changed:  when to take this   albuterol (2.5 MG/3ML) 0.083% nebulizer solution Commonly known as:  PROVENTIL Take 3 mLs (2.5 mg total) by nebulization 3 (three) times daily. What changed:  You were already taking a medication with the same name, and this prescription was added. Make sure you understand how and when to take each.   AZILECT 1 MG Tabs tablet Generic drug:  rasagiline Take 1 mg by mouth daily at 2 PM.   BENEFIBER PO Take 1 scoop by mouth daily with breakfast. Mix iIn 120 ml of fluid   budesonide 0.25 MG/2ML nebulizer solution Commonly known as:  PULMICORT Take 0.25 mg by nebulization daily after breakfast.   Calcium Citrate-Vitamin D 200-250 MG-UNIT Tabs Take 3 tablets by mouth 2 (two) times daily.   cholecalciferol 1000 units tablet Commonly known as:  VITAMIN D Take 3,000 Units by mouth daily with breakfast.   DIABETIC TUSSIN EX PO Take 5 mLs by mouth every 6 (six)  hours as needed (congestion/cough.).   ENSURE Take 237 mLs by mouth 2 (two) times daily between meals. *Vanilla*   EXELON 13.3 MG/24HR Generic drug:  rivastigmine Place 1 patch onto the skin daily. Rotate sites   fluticasone 50 MCG/ACT nasal spray Commonly known as:  FLONASE Place 1 spray into both nostrils daily after breakfast.   guaiFENesin-dextromethorphan 100-10 MG/5ML syrup Commonly known as:  ROBITUSSIN DM Take 5 mLs by mouth every 4 (four) hours as needed for cough.   hydrALAZINE 10 MG tablet Commonly known as:  APRESOLINE Take 1 tablet (10 mg total) by mouth 2 (two) times daily.   levothyroxine 25 MCG tablet Commonly known as:  SYNTHROID, LEVOTHROID Take 1 tablet (25 mcg total) by mouth daily before breakfast.   loperamide 2 MG capsule Commonly known as:  IMODIUM Take 2 mg by mouth daily as needed for diarrhea or loose stools.   magic mouthwash Soln Take 2  mLs by mouth 3 (three) times daily.   NEUPRO 4 MG/24HR Generic drug:  rotigotine Place 1 patch onto the skin daily. Remove old patch before applying new one.   ranitidine 75 MG tablet Commonly known as:  ZANTAC Take 75 mg by mouth 2 (two) times daily.   RESOURCE THICKENUP CLEAR Powd Take 120 g by mouth as needed.   RYTARY 48.75-195 MG Cpcr Generic drug:  Carbidopa-Levodopa ER Take 1 capsule by mouth 4 (four) times daily. Takes 1 capsule by mouth 4 times a day at 0000, 0800, 1400 and 1800   saccharomyces boulardii 250 MG capsule Commonly known as:  FLORASTOR Take 1 capsule (250 mg total) by mouth 2 (two) times daily.   senna-docusate 8.6-50 MG tablet Commonly known as:  Senokot-S Take 1 tablet by mouth at bedtime as needed for mild constipation.   vitamin B-12 500 MCG tablet Commonly known as:  CYANOCOBALAMIN Take 500 mcg by mouth daily with breakfast.       Contact information for follow-up providers    Javier Glazier, MD Follow up on 02/09/2017.   Specialty:  Pulmonary Disease Why:  Appt  at 10:45 am Contact information: [address] [place] [address] Detmold, Peter, DO Follow up in 1 week(s).   Specialty:  Family Medicine Contact information: [place] [address] [phone]            Contact information for after-discharge care    Destination    [facility] Follow up.   Specialty:  Skilled Nursing Facility Contact information: [address] [place] [phone]                   The results of significant diagnostics from this hospitalization (including imaging, microbiology, ancillary and laboratory) are listed below for reference.    Significant Diagnostic Studies: Dg Chest Port 1 View  Result Date: 01/23/2017 CLINICAL DATA:  Pneumonia EXAM: PORTABLE CHEST 1 VIEW COMPARISON:  January 19, 2017 FINDINGS: There is extensive airspace consolidation throughout the left mid lower lung zones. Compared to most recent study, there is now aeration of a portion of the left upper lobe. Right lung is mildly hyperexpanded but clear. There is shift of heart and mediastinum toward the left, stable. The heart size is within normal limits. Visualized pulmonary vascular is normal. There is aortic atherosclerosis. No evident adenopathy. Bones are osteoporotic. There is marked arthropathy in the right shoulder. IMPRESSION: Extensive consolidation throughout much the left lung, now with aeration of a portion of the left upper lobe. There remain shift of heart and mediastinum toward the left. Right lung hyperexpanded but clear. Stable cardiac silhouette. There is aortic atherosclerosis. There is advanced arthropathy in the right shoulder with chronic rotator cuff tear, characterized by superior migration of the right humeral head. Aortic Atherosclerosis (ICD10-I70.0). Electronically Signed   By: Lowella Grip III M.D.   On: 01/23/2017 07:11   Dg Chest Port 1  View  Result Date: 01/19/2017 CLINICAL DATA:  History of bronchiectasis and COPD. Shortness of breath. Pneumonia. EXAM: PORTABLE CHEST 1 VIEW COMPARISON:  01/18/2017. FINDINGS: Remote right rib trauma. Osteopenia. Patient chin overlies the left apex. Patient rotated left. Mild volume loss in the left hemithorax. Heart size not well evaluated. Atherosclerosis in the transverse aorta. Progressive whiteout of the left hemithorax. No pneumothorax. Probable trace right pleural fluid. Clear right lung. IMPRESSION: Progressive, now  complete whiteout of the left hemithorax. This likely due to a combination of pleural fluid and airspace disease. Given progression since 01/10/2017, mucous plugging in the left endobronchial tree is also a consideration. Aortic Atherosclerosis (ICD10-I70.0). Electronically Signed   By: Abigail Miyamoto M.D.   On: 01/19/2017 09:40   Dg Chest Port 1 View  Result Date: 01/18/2017 CLINICAL DATA:  Hx of bronchiectasis. Hx of COPD. Pt is very SOB. Weakness. Not taking HTN meds. Not diabetic. Nonsmoker. No hx of CHF or AFIB. EXAM: PORTABLE CHEST 1 VIEW COMPARISON:  01/15/2017 FINDINGS: The patient's chin obscures the upper chest. There is persistent significant opacity throughout the left lung, with sparing of the lung apex. There is evidence for left lung volume loss. The appearance is similar to the prior study. Small right pleural effusion persists. Remote rib fractures are noted. Old right humerus fracture. IMPRESSION: Persistent significant opacity in the left lung, not significantly changed. Electronically Signed   By: Nolon Nations M.D.   On: 01/18/2017 11:08   Dg Chest Port 1 View  Result Date: 01/15/2017 CLINICAL DATA:  83 year old female respiratory failure. Subsequent encounter. EXAM: PORTABLE CHEST 1 VIEW COMPARISON:  01/13/2017 01/04/2016 2018 chest x-ray. FINDINGS: Almost complete consolidation left thorax with sparring of the left lung apex. Mediastinal shift to left. This may  reflect atelectasis from mucous plug or central obstructing lesion. Post obstructive infiltrate or pleural effusion not excluded. No pneumothorax. Calcified aorta. IMPRESSION: Almost complete consolidation left thorax with sparring of the left lung apex. Mediastinal shift to left. This may reflect atelectasis from mucous plug or central obstructing lesion. Electronically Signed   By: Genia Del M.D.   On: 01/15/2017 06:49   Dg Chest Port 1 View  Result Date: 01/13/2017 CLINICAL DATA:  Shortness of breath today. Follow-up left pneumonia. EXAM: PORTABLE CHEST 1 VIEW COMPARISON:  01/10/2017 FINDINGS: Radiographic worsening with progressive opacification of the left hemithorax. Left lower lobe is now completely consolidated. Partial consolidation of the left upper lobe. Probable pleural fluid on the left. Pneumonia now developing in the right lung with some pleural fluid dependently and atelectasis/ infiltrate in the right lower lobe. IMPRESSION: Marked radiographic worsening as outlined above. Electronically Signed   By: Nelson Chimes M.D.   On: 01/13/2017 15:37   Dg Chest Port 1 View  Result Date: 01/10/2017 CLINICAL DATA:  Shortness of breath.  Cough and congestion. EXAM: PORTABLE CHEST 1 VIEW COMPARISON:  11/08/2015 .  07/30/2015 . FINDINGS: Heart size normal. Left base infiltrate with left-sided pleural effusion. Component of scarring may be present. Interstitial changes are again noted most likely chronic. No acute bony abnormality. Degenerative changes noted of the thoracic spine and both shoulders. IMPRESSION: 1. Left lower lobe infiltrate with left-sided pleural effusion. Component these changes may be related scarring. 2. Chronic interstitial lung disease . Electronically Signed   By: Marcello Moores  Register   On: 01/10/2017 12:58    Microbiology: No results found for this or any previous visit (from the past 240 hour(s)).   Labs: Basic Metabolic Panel:  Recent Labs Lab 01/19/17 0525  01/20/17 0539 01/22/17 0527 01/24/17 0809  NA 141 140 139 139  K 3.4* 3.8 3.7 3.7  CL 104 103 105 101  CO2 29 31 30 31   GLUCOSE 84 91 87 86  BUN 18 16 11 15   CREATININE 0.62 0.60 0.62 0.64  CALCIUM 7.9* 7.9* 7.9* 8.6*   Liver Function Tests: No results for input(s): AST, ALT, ALKPHOS, BILITOT, PROT, ALBUMIN in the last  168 hours. No results for input(s): LIPASE, AMYLASE in the last 168 hours. No results for input(s): AMMONIA in the last 168 hours. CBC:  Recent Labs Lab 01/19/17 0525 01/20/17 0539 01/22/17 0527 01/24/17 0809  WBC 6.5 6.4 6.8 7.0  HGB 9.5* 9.1* 9.0* 9.4*  HCT 30.0* 29.0* 28.5* 30.2*  MCV 90.6 90.9 91.6 91.0  PLT 380 394 438* 505*   Cardiac Enzymes: No results for input(s): CKTOTAL, CKMB, CKMBINDEX, TROPONINI in the last 168 hours. BNP: BNP (last 3 results) No results for input(s): BNP in the last 8760 hours.  ProBNP (last 3 results) No results for input(s): PROBNP in the last 8760 hours.  CBG:  Recent Labs Lab 01/21/17 0723 01/22/17 0739 01/23/17 0724 01/24/17 0730 01/25/17 0729  GLUCAP 71 79 76 78 70

## 2017-01-25 NOTE — Discharge Instructions (Signed)
Community-Acquired Pneumonia, Adult °Pneumonia is an infection of the lungs. There are different types of pneumonia. One type can develop while a person is in a hospital. A different type, called community-acquired pneumonia, develops in people who are not, or have not recently been, in the hospital or other health care facility. °What are the causes? °Pneumonia may be caused by bacteria, viruses, or funguses. Community-acquired pneumonia is often caused by Streptococcus pneumonia bacteria. These bacteria are often passed from one person to another by breathing in droplets from the cough or sneeze of an infected person. °What increases the risk? °The condition is more likely to develop in: °· People who have chronic diseases, such as chronic obstructive pulmonary disease (COPD), asthma, congestive heart failure, cystic fibrosis, diabetes, or kidney disease. °· People who have early-stage or late-stage HIV. °· People who have sickle cell disease. °· People who have had their spleen removed (splenectomy). °· People who have poor dental hygiene. °· People who have medical conditions that increase the risk of breathing in (aspirating) secretions their own mouth and nose. °· People who have a weakened immune system (immunocompromised). °· People who smoke. °· People who travel to areas where pneumonia-causing germs commonly exist. °· People who are around animal habitats or animals that have pneumonia-causing germs, including birds, bats, rabbits, cats, and farm animals. ° °What are the signs or symptoms? °Symptoms of this condition include: °· A dry cough. °· A wet (productive) cough. °· Fever. °· Sweating. °· Chest pain, especially when breathing deeply or coughing. °· Rapid breathing or difficulty breathing. °· Shortness of breath. °· Shaking chills. °· Fatigue. °· Muscle aches. ° °How is this diagnosed? °Your health care provider will take a medical history and perform a physical exam. You may also have other tests,  including: °· Imaging studies of your chest, including X-rays. °· Tests to check your blood oxygen level and other blood gases. °· Other tests on blood, mucus (sputum), fluid around your lungs (pleural fluid), and urine. ° °If your pneumonia is severe, other tests may be done to identify the specific cause of your illness. °How is this treated? °The type of treatment that you receive depends on many factors, such as the cause of your pneumonia, the medicines you take, and other medical conditions that you have. For most adults, treatment and recovery from pneumonia may occur at home. In some cases, treatment must happen in a hospital. Treatment may include: °· Antibiotic medicines, if the pneumonia was caused by bacteria. °· Antiviral medicines, if the pneumonia was caused by a virus. °· Medicines that are given by mouth or through an IV tube. °· Oxygen. °· Respiratory therapy. ° °Although rare, treating severe pneumonia may include: °· Mechanical ventilation. This is done if you are not breathing well on your own and you cannot maintain a safe blood oxygen level. °· Thoracentesis. This procedure removes fluid around one lung or both lungs to help you breathe better. ° °Follow these instructions at home: °· Take over-the-counter and prescription medicines only as told by your health care provider. °? Only take cough medicine if you are losing sleep. Understand that cough medicine can prevent your body’s natural ability to remove mucus from your lungs. °? If you were prescribed an antibiotic medicine, take it as told by your health care provider. Do not stop taking the antibiotic even if you start to feel better. °· Sleep in a semi-upright position at night. Try sleeping in a reclining chair, or place a few pillows under your head. °· Do not use tobacco products, including cigarettes, chewing   tobacco, and e-cigarettes. If you need help quitting, ask your health care provider. °· Drink enough water to keep your urine  clear or pale yellow. This will help to thin out mucus secretions in your lungs. °How is this prevented? °There are ways that you can decrease your risk of developing community-acquired pneumonia. Consider getting a pneumococcal vaccine if: °· You are older than 81 years of age. °· You are older than 81 years of age and are undergoing cancer treatment, have chronic lung disease, or have other medical conditions that affect your immune system. Ask your health care provider if this applies to you. ° °There are different types and schedules of pneumococcal vaccines. Ask your health care provider which vaccination option is best for you. °You may also prevent community-acquired pneumonia if you take these actions: °· Get an influenza vaccine every year. Ask your health care provider which type of influenza vaccine is best for you. °· Go to the dentist on a regular basis. °· Wash your hands often. Use hand sanitizer if soap and water are not available. ° °Contact a health care provider if: °· You have a fever. °· You are losing sleep because you cannot control your cough with cough medicine. °Get help right away if: °· You have worsening shortness of breath. °· You have increased chest pain. °· Your sickness becomes worse, especially if you are an older adult or have a weakened immune system. °· You cough up blood. °This information is not intended to replace advice given to you by your health care provider. Make sure you discuss any questions you have with your health care provider. °Document Released: 07/03/2005 Document Revised: 11/11/2015 Document Reviewed: 10/28/2014 °Elsevier Interactive Patient Education © 2017 [employer] Inc[employer] ° °

## 2017-01-25 NOTE — Progress Notes (Signed)
Date: January 25, 2017  Discharge orders review for case management needs.  None found  Per patient or family member no additional needs at home. Velva Harman, BSN, [place], CCM:  [phone]

## 2017-01-25 NOTE — Clinical Social Work Placement (Addendum)
[facility] called for transport/pick up 11:00am Nurse given number for report.  D/c summary faxed.   CLINICAL SOCIAL WORK PLACEMENT  NOTE  Date:  01/25/2017  Patient Details  Name: Shannon Dunn MRN: [id number] Date of Birth: 11/28/1923  Clinical Social Work is seeking post-discharge placement for this patient at the [place] level of care (*CSW will initial, date and re-position this form in  chart as items are completed):  Yes   Patient/family provided with [place] Work Department's list of facilities offering this level of care within the geographic area requested by the patient (or if unable, by the patient's family).  Yes   Patient/family informed of their freedom to choose among providers that offer the needed level of care, that participate in Medicare, Medicaid or managed care program needed by the patient, have an available bed and are willing to accept the patient.  Yes   Patient/family informed of [place]'s ownership interest in [facility] and [facility], as well as of the fact that they are under no obligation to receive care at these facilities.  PASRR submitted to EDS on       PASRR number received on       Existing PASRR number confirmed on 01/18/17     FL2 transmitted to all facilities in geographic area requested by pt/family on 01/18/17     FL2 transmitted to all facilities within larger geographic area on 01/18/17     Patient informed that his/her managed care company has contracts with or will negotiate with certain facilities, including the following:  [facility]     Yes   Patient/family informed of bed offers received.  Patient chooses bed at [facility] and [facility] recommends and patient chooses bed at [facility]    Patient to be transferred to [facility] on 01/25/17.  Patient to be transferred to facility by [facility]      Patient family  notified on 01/25/17 of transfer.  Name of family member notified:  Daughter at bedside.    PHYSICIAN       Additional Comment:    _______________________________________________ Lia Hopping, LCSW 01/25/2017, 10:39 AM

## 2017-01-26 ENCOUNTER — Non-Acute Institutional Stay (SKILLED_NURSING_FACILITY): Payer: Medicare Other | Admitting: Internal Medicine

## 2017-01-26 DIAGNOSIS — B965 Pseudomonas (aeruginosa) (mallei) (pseudomallei) as the cause of diseases classified elsewhere: Secondary | ICD-10-CM

## 2017-01-26 DIAGNOSIS — K529 Noninfective gastroenteritis and colitis, unspecified: Secondary | ICD-10-CM

## 2017-01-26 DIAGNOSIS — J441 Chronic obstructive pulmonary disease with (acute) exacerbation: Secondary | ICD-10-CM

## 2017-01-26 DIAGNOSIS — F0280 Dementia in other diseases classified elsewhere without behavioral disturbance: Secondary | ICD-10-CM

## 2017-01-26 DIAGNOSIS — J9601 Acute respiratory failure with hypoxia: Secondary | ICD-10-CM

## 2017-01-26 DIAGNOSIS — G20 Parkinson's disease: Secondary | ICD-10-CM | POA: Diagnosis not present

## 2017-01-26 DIAGNOSIS — G301 Alzheimer's disease with late onset: Secondary | ICD-10-CM

## 2017-01-26 DIAGNOSIS — J479 Bronchiectasis, uncomplicated: Secondary | ICD-10-CM

## 2017-01-26 DIAGNOSIS — E034 Atrophy of thyroid (acquired): Secondary | ICD-10-CM | POA: Diagnosis not present

## 2017-01-26 DIAGNOSIS — J189 Pneumonia, unspecified organism: Secondary | ICD-10-CM

## 2017-01-26 DIAGNOSIS — A498 Other bacterial infections of unspecified site: Secondary | ICD-10-CM

## 2017-01-26 DIAGNOSIS — I10 Essential (primary) hypertension: Secondary | ICD-10-CM

## 2017-01-26 DIAGNOSIS — G20A1 Parkinson's disease without dyskinesia, without mention of fluctuations: Secondary | ICD-10-CM

## 2017-01-26 NOTE — Progress Notes (Signed)
: Provider:  Noah Delaine. Sheppard Coil, MD Location:  [facility] Room Number: 166 Place of Service:  SNF [phone])   PCP: Renata Caprice, DO Patient Care Team: Renata Caprice, DO as PCP - General (Family Medicine) Elsie Stain, MD as Consulting Physician (Pulmonary Disease)  Extended Emergency Contact Information Primary Emergency Contact: [place], [place] [address] Johnnette Litter of [place] Phone: [phone] Relation: Daughter Secondary Emergency Contact: Barringer,Paul R Address: [address]          [place], [place] [address] Johnnette Litter of [place] Phone: [phone] Relation: Spouse     Allergies: Zithromax [azithromycin]; Iodine; Ivp dye [iodinated diagnostic agents]; and Shellfish allergy  Chief Complaint  Patient presents with  . New Admit To SNF    following hospitalization 01/10/17 to 01/25/17 Bronchiectasis without acute exacerbation.     HPI: Patient is 83 y.o. female with COPD/bronchiectasis, Parkinson's disease, Paget disease bone disease, osteoporosis, history of colon cancer, who was brought to the ER by the family due to concerns of abnormal breath sounds and decreased mentation. History was per family due to patient's mental status. Family states patient has had a productive cough for the past 2 weeks productive of green sputum. She has felt febrile but did not have a temperature. The morning she was brought to the ED the patient was noted to be more drowsy with increased abdominal breath sounds and the patient was just brought to the ED. Of late patient has had periods of sundowning. Patient has been eating small bites and meals a time so there could be some concern for aspiration. When patient was admitted last April for a hip fracture she was evaluated by speech and swallow was recommended and she was placed on a dysphagia 1 diet. In ED patient was noted to be febrile with a temperature of 101 Fahrenheit, lactate 2.2  tachycardia, tachypnea, and signs of pneumonia on x-ray. She was placed on BiPAP for risk for support and started on vancomycin and cefepime. Patient was admitted to [facility] from 6/27-7/12 for acute respiratory failure with hypoxia in the setting of a COPD exacerbation ablation and extensive bronchiectasis and HCAP, with Pseudomonas growing out in sputum. Patient's initial antibiotics were changed to [facility] and tobramycin and patient completed a seven-day course. Patient was admitted to skilled nursing facility with generalized weakness for OT PT. While at skilled nursing facility patient will be followed for Parkinson's disease treated with carbidopa/levodopa, hypothyroidism treated with Synthroid and hypertension treated with hydralazine.  Past Medical History:  Diagnosis Date  . Bronchiectasis without acute exacerbation ([place]) 03/24/2013   No ct on file as of 04/29/2015    . Cancer ([place]) 04/16/2014     Overview:  colon    . Cancer of nipple and areola of female breast ([place]) 04/16/2014  . Colon cancer ([place])   . COPD (chronic obstructive pulmonary disease) ([place])   . Dementia   . Femur fracture ([place]) 11/04/2015  . H/O nutritional disorder 01/15/2013  . HCAP (healthcare-associated pneumonia) 07/16/2015  . Hernia of anterior abdominal wall 03/02/2013   Last Assessment & Plan:  Soft, reducible. Not causing her problems.  No need for further tx at this time   . Hip fracture, left ([place]) 11/04/2015  . Mammary Pagets disease ([place]) 04/16/2014  . Obstructive bronchiectasis ([place]) 04/29/2015   No pft's or ct's on file as of 04/29/2015    . Osteoporosis   .  Paget's bone disease   . Pagets disease, breast ([place])   . Parkinson's disease ([place])   . Pernicious anemia   . Renal disorder    Renal insufficiency  . Ventral hernia   . Vitamin D deficiency     Past Surgical History:  Procedure Laterality Date  . BREAST SURGERY Left   . COLON SURGERY     bowel resection  . HIP ARTHROPLASTY Left  11/04/2015   Procedure: ARTHROPLASTY BIPOLAR HIP (HEMIARTHROPLASTY);  Surgeon: Tania Ade, MD;  Location: [facility];  Service: Orthopedics;  Laterality: Left;  . HIP PINNING,CANNULATED Left 11/04/2015   Procedure: CANNULATED HIP PINNING;  Surgeon: Tania Ade, MD;  Location: [facility];  Service: Orthopedics;  Laterality: Left;    Allergies as of 01/26/2017      Reactions   Zithromax [azithromycin] Diarrhea   Iodine Other (See Comments)   Unknown, might be rash/hives   Ivp Dye [iodinated Diagnostic Agents] Other (See Comments)   Unknown rash/hives maybe   Shellfish Allergy Other (See Comments)   Unknown rash/hives, scallops       Medication List       Accurate as of 01/26/17  9:46 AM. Always use your most recent med list.          acetaminophen 650 MG suppository Commonly known as:  TYLENOL Place 1 suppository (650 mg total) rectally every 4 (four) hours as needed for fever.   albuterol (2.5 MG/3ML) 0.083% nebulizer solution Commonly known as:  PROVENTIL Take 3 mLs (2.5 mg total) by nebulization every 6 (six) hours as needed for wheezing or shortness of breath.   albuterol (2.5 MG/3ML) 0.083% nebulizer solution Commonly known as:  PROVENTIL Take 3 mLs (2.5 mg total) by nebulization 3 (three) times daily.   AZILECT 1 MG Tabs tablet Generic drug:  rasagiline Take 1 mg by mouth daily at 2 PM.   BENEFIBER PO Take 1 scoop by mouth daily with breakfast. Mix iIn 120 ml of fluid   budesonide 0.25 MG/2ML nebulizer solution Commonly known as:  PULMICORT Take 0.25 mg by nebulization daily after breakfast.   Calcium Citrate-Vitamin D 200-250 MG-UNIT Tabs Take 3 tablets by mouth 2 (two) times daily.   cholecalciferol 1000 units tablet Commonly known as:  VITAMIN D Take 3,000 Units by mouth daily with breakfast.   ENSURE Take 237 mLs by mouth 2 (two) times daily between meals. *Vanilla*   EXELON 13.3 MG/24HR Generic drug:  rivastigmine Place 1 patch onto the skin daily.  Rotate sites   fluticasone 50 MCG/ACT nasal spray Commonly known as:  FLONASE Place 1 spray into both nostrils daily after breakfast.   guaiFENesin-dextromethorphan 100-10 MG/5ML syrup Commonly known as:  ROBITUSSIN DM Take 5 mLs by mouth every 4 (four) hours as needed for cough.   hydrALAZINE 10 MG tablet Commonly known as:  APRESOLINE Take 1 tablet (10 mg total) by mouth 2 (two) times daily.   levothyroxine 25 MCG tablet Commonly known as:  SYNTHROID, LEVOTHROID Take 1 tablet (25 mcg total) by mouth daily before breakfast.   loperamide 2 MG capsule Commonly known as:  IMODIUM Take 2 mg by mouth daily as needed for diarrhea or loose stools.   magic mouthwash Soln Take 2 mLs by mouth 3 (three) times daily.   NEUPRO 4 MG/24HR Generic drug:  rotigotine Place 1 patch onto the skin daily. Remove old patch before applying new one.   ranitidine 75 MG tablet Commonly known as:  ZANTAC Take 75 mg by mouth 2 (two)  times daily.   RYTARY 48.75-195 MG Cpcr Generic drug:  Carbidopa-Levodopa ER Take 1 capsule by mouth 4 (four) times daily. Takes 1 capsule by mouth 4 times a day at 0000, 0800, 1400 and 1800   saccharomyces boulardii 250 MG capsule Commonly known as:  FLORASTOR Take 1 capsule (250 mg total) by mouth 2 (two) times daily.   senna-docusate 8.6-50 MG tablet Commonly known as:  Senokot-S Take 1 tablet by mouth at bedtime as needed for mild constipation.   vitamin B-12 500 MCG tablet Commonly known as:  CYANOCOBALAMIN Take 500 mcg by mouth daily with breakfast.       No orders of the defined types were placed in this encounter.   Immunization History  Administered Date(s) Administered  . Influenza,inj,Quad PF,36+ Mos 08/13/2015  . PPD Test 11/08/2015, 11/22/2015, 01/25/2017    Social History  Substance Use Topics  . Smoking status: Never Smoker  . Smokeless tobacco: Never Used  . Alcohol use Yes     Comment: glass of wine maybe once a week    Family  history is   Family History  Problem Relation Age of Onset  . Heart disease Mother   . Alzheimer's disease Father       Review of Systems  DATA OBTAINED: from family member GENERAL:  no fevers, fatigue, appetite changes SKIN: No itching, or rash EYES: No eye pain, redness, discharge EARS: No earache, tinnitus, change in hearing NOSE: No congestion, drainage or bleeding  MOUTH/THROAT: No mouth or tooth pain, No sore throat RESPIRATORY: + cough,- wheezing,- SOB CARDIAC: No chest pain, palpitations, lower extremity edema  GI: No abdominal pain, No N/V/D or constipation, No heartburn or reflux  GU: No dysuria, frequency or urgency, or incontinence  MUSCULOSKELETAL: No unrelieved bone/joint pain NEUROLOGIC: No headache, dizziness or focal weakness PSYCHIATRIC: No c/o anxiety or sadness   Vitals:   01/26/17 0928  BP: (!) 114/58  Pulse: 68  Resp: 20  Temp: 98.7 F (37.1 C)    SpO2 Readings from Last 1 Encounters:  01/26/17 96%   Body mass index is 18.18 kg/m.     Physical Exam  GENERAL APPEARANCE: Alert,  No acute distress.  SKIN: No diaphoresis rash;Upper extremity bruising; soft tissue lesion on forehead HEAD: Normocephalic, atraumatic  EYES: Conjunctiva/lids clear. Pupils round, reactive. EOMs intact.  EARS: External exam WNL, canals clear. Hearing grossly normal.  NOSE: No deformity or discharge.  MOUTH/THROAT: Lips w/o lesions  RESPIRATORY: Breathing is even, unlabored. Lung sounds are Decreased breath sounds on the left   CARDIOVASCULAR: Heart RRR no murmurs, rubs or gallops. No peripheral edema.   GASTROINTESTINAL: Abdomen is soft, non-tender, not distended w/ normal bowel sounds. GENITOURINARY: Bladder non tender, not distended  MUSCULOSKELETAL: No abnormal joints or musculature NEUROLOGIC:  Cranial nerves 2-12 grossly intact. Moves all extremities  PSYCHIATRIC: Mood and affect with dementia, no behavioral issues  Patient Active Problem List   Diagnosis  Date Noted  . Malnutrition of moderate degree 01/11/2017  . COPD (chronic obstructive pulmonary disease) ([place]) 12/03/2015  . Ecchymosis 11/27/2015  . Hypothyroidism 11/14/2015  . Protein-calorie malnutrition, severe 11/05/2015  . Femur fracture ([place]) 11/04/2015  . Hip fracture, left ([place]) 11/04/2015  . Chronic respiratory failure with hypoxia ([place]) 09/13/2015  . Physical deconditioning 07/19/2015  . Acute respiratory failure with hypoxia ([place]) 07/18/2015  . HCAP (healthcare-associated pneumonia) 07/16/2015  . Obstructive bronchiectasis ([place]) 04/29/2015  . H/O malignant neoplasm of skin 12/24/2014  . Cancer ([place]) 04/16/2014  . Mammary Pagets  disease ([place]) 04/16/2014  . Cancer of nipple and areola of female breast ([place]) 04/16/2014  . Bronchiectasis without acute exacerbation ([place]) 03/24/2013  . Pernicious anemia   . Dementia   . Osteoporosis   . Parkinson's disease ([place])   . Hernia of anterior abdominal wall 03/02/2013  . H/O nutritional disorder 01/15/2013  . Personal history of nutritional deficiency 01/15/2013      Labs reviewed: Basic Metabolic Panel:    Component Value Date/Time   NA 139 01/24/2017 0809   NA 136 (A) 11/23/2015   K 3.7 01/24/2017 0809   CL 101 01/24/2017 0809   CO2 31 01/24/2017 0809   GLUCOSE 86 01/24/2017 0809   BUN 15 01/24/2017 0809   BUN 20 11/23/2015   CREATININE 0.64 01/24/2017 0809   CALCIUM 8.6 (L) 01/24/2017 0809   PROT 6.5 01/11/2017 0328   ALBUMIN 2.9 (L) 01/11/2017 0328   AST 11 (L) 01/11/2017 0328   ALT 11 (L) 01/11/2017 0328   ALKPHOS 58 01/11/2017 0328   BILITOT 0.9 01/11/2017 0328   GFRNONAA >60 01/24/2017 0809   GFRAA >60 01/24/2017 0809     Recent Labs  01/20/17 0539 01/22/17 0527 01/24/17 0809  NA 140 139 139  K 3.8 3.7 3.7  CL 103 105 101  CO2 31 30 31   GLUCOSE 91 87 86  BUN 16 11 15   CREATININE 0.60 0.62 0.64  CALCIUM 7.9* 7.9* 8.6*   Liver Function Tests:  Recent Labs  01/11/17 0328  AST 11*  ALT 11*    ALKPHOS 58  BILITOT 0.9  PROT 6.5  ALBUMIN 2.9*   No results for input(s): LIPASE, AMYLASE in the last 8760 hours. No results for input(s): AMMONIA in the last 8760 hours. CBC:  Recent Labs  01/10/17 1226  01/20/17 0539 01/22/17 0527 01/24/17 0809  WBC 11.8*  < > 6.4 6.8 7.0  NEUTROABS 10.0*  --   --   --   --   HGB 11.1*  < > 9.1* 9.0* 9.4*  HCT 34.0*  < > 29.0* 28.5* 30.2*  MCV 90.9  < > 90.9 91.6 91.0  PLT 369  < > 394 438* 505*  < > = values in this interval not displayed. Lipid No results for input(s): CHOL, HDL, LDLCALC, TRIG in the last 8760 hours.  Cardiac Enzymes: No results for input(s): CKTOTAL, CKMB, CKMBINDEX, TROPONINI in the last 8760 hours. BNP: No results for input(s): BNP in the last 8760 hours. No results found for: MICROALBUR No results found for: HGBA1C Lab Results  Component Value Date   TSH 1.325 01/13/2017   No results found for: VITAMINB12 No results found for: FOLATE No results found for: IRON, TIBC, FERRITIN  Imaging and Procedures obtained prior to SNF admission: Dg Chest Port 1 View  Result Date: 01/10/2017 CLINICAL DATA:  Shortness of breath.  Cough and congestion. EXAM: PORTABLE CHEST 1 VIEW COMPARISON:  11/08/2015 .  07/30/2015 . FINDINGS: Heart size normal. Left base infiltrate with left-sided pleural effusion. Component of scarring may be present. Interstitial changes are again noted most likely chronic. No acute bony abnormality. Degenerative changes noted of the thoracic spine and both shoulders. IMPRESSION: 1. Left lower lobe infiltrate with left-sided pleural effusion. Component these changes may be related scarring. 2. Chronic interstitial lung disease . Electronically Signed   By: Marcello Moores  Register   On: 01/10/2017 12:58     Not all labs, radiology exams or other studies done during hospitalization come through on my EPIC note; however  they are reviewed by me.    Assessment and Plan  ACUTE RESPIRATORY FAILURE WITH  HYPOXEMIA/ACUTE COPD EXACERBATION AND BRONCHIECTASIS/HCAP/PSEUDOMONAS IN SPUTUM-chest x-ray on 7/2 showed almost complete consolidation of the left thorax with mediastinal shift to left, chest x-ray 7/5 show persistent left lung opacity not significantly changed; Pseudomonas was grown out in the sputum culture chest x-ray on 7/10 showed extensive consolidation through much of the left lung now with elevation of a portion of the left upper lobe, there remained a shift of heart and mediastinum toward the left; patient completed 7 days of Fortaz and tobramycin SNF - patient is admitted for generalized weakness for OT/PT; no further antibiotics are needed  PARKINSON'S DISEASE SNF - chronic and stable, continue azilect 1 mg by mouth daily , neupro 4 mg one patch daily, and carbidopa-levodopa ER 48.75-195 1 by mouth 4 times a day at midnight, 8 AM, 2:00, and 6 PM  HYPOTHYROIDISM SNF -continue Synthroid 25 mg by mouth daily  HTN SNF -controlled continue hydralazine 10 mg twice a day  DEMENTIA SNF - continue Exelon patch 13.3 mg daily  CHRONIC DIARRHEA SNF - continue Imodium 2 mg by mouth every morning and 2 mg by mouth daily when necessary, continue Benefiber   Time spent > 45 min;> 50% of time with patient was spent reviewing records, labs, tests and studies, counseling and developing plan of care  Webb Silversmith D. Sheppard Coil, MD

## 2017-01-27 ENCOUNTER — Encounter: Payer: Self-pay | Admitting: Internal Medicine

## 2017-01-27 DIAGNOSIS — J441 Chronic obstructive pulmonary disease with (acute) exacerbation: Secondary | ICD-10-CM | POA: Insufficient documentation

## 2017-01-27 DIAGNOSIS — I10 Essential (primary) hypertension: Secondary | ICD-10-CM | POA: Insufficient documentation

## 2017-01-27 DIAGNOSIS — A498 Other bacterial infections of unspecified site: Secondary | ICD-10-CM | POA: Insufficient documentation

## 2017-01-27 DIAGNOSIS — K529 Noninfective gastroenteritis and colitis, unspecified: Secondary | ICD-10-CM | POA: Insufficient documentation

## 2017-02-09 ENCOUNTER — Inpatient Hospital Stay: Payer: Medicare Other | Admitting: Pulmonary Disease

## 2017-02-12 ENCOUNTER — Encounter: Payer: Self-pay | Admitting: Nurse Practitioner

## 2017-02-12 ENCOUNTER — Non-Acute Institutional Stay (SKILLED_NURSING_FACILITY): Payer: Medicare Other | Admitting: Nurse Practitioner

## 2017-02-12 DIAGNOSIS — K439 Ventral hernia without obstruction or gangrene: Secondary | ICD-10-CM

## 2017-02-12 DIAGNOSIS — K529 Noninfective gastroenteritis and colitis, unspecified: Secondary | ICD-10-CM

## 2017-02-12 DIAGNOSIS — J410 Simple chronic bronchitis: Secondary | ICD-10-CM

## 2017-02-12 DIAGNOSIS — F0280 Dementia in other diseases classified elsewhere without behavioral disturbance: Secondary | ICD-10-CM

## 2017-02-12 DIAGNOSIS — J479 Bronchiectasis, uncomplicated: Secondary | ICD-10-CM

## 2017-02-12 DIAGNOSIS — R58 Hemorrhage, not elsewhere classified: Secondary | ICD-10-CM

## 2017-02-12 DIAGNOSIS — M8000XA Age-related osteoporosis with current pathological fracture, unspecified site, initial encounter for fracture: Secondary | ICD-10-CM | POA: Diagnosis not present

## 2017-02-12 DIAGNOSIS — G20 Parkinson's disease: Secondary | ICD-10-CM

## 2017-02-12 DIAGNOSIS — I10 Essential (primary) hypertension: Secondary | ICD-10-CM

## 2017-02-12 DIAGNOSIS — D510 Vitamin B12 deficiency anemia due to intrinsic factor deficiency: Secondary | ICD-10-CM

## 2017-02-12 DIAGNOSIS — K219 Gastro-esophageal reflux disease without esophagitis: Secondary | ICD-10-CM | POA: Insufficient documentation

## 2017-02-12 DIAGNOSIS — E038 Other specified hypothyroidism: Secondary | ICD-10-CM | POA: Diagnosis not present

## 2017-02-12 NOTE — Assessment & Plan Note (Addendum)
Rasagiline 1mg  qd, Rotigotine 4mg /24hr patch, Carbidopa-Levodopa 48.75-295 mg qid. F/u Neurology in one week.

## 2017-02-12 NOTE — Assessment & Plan Note (Signed)
Stable, reducible

## 2017-02-12 NOTE — Assessment & Plan Note (Signed)
Stable, taking Robitussin 72ml q4h prn, Flonase daily, Albuterol Neb q6h prn, tid, Pulmicort bid,

## 2017-02-12 NOTE — Assessment & Plan Note (Signed)
Stable, f/u Pulmonology in one week.

## 2017-02-12 NOTE — Assessment & Plan Note (Signed)
Continue Levothyroxine 83mcg po daily.

## 2017-02-12 NOTE — Assessment & Plan Note (Signed)
resides in Memory care facility, taking Exelon 13.3mg /24hr patch

## 2017-02-12 NOTE — Assessment & Plan Note (Signed)
Controlled, continue Hydralazine 10mg  bid

## 2017-02-12 NOTE — Assessment & Plan Note (Signed)
Related to s/p colectomy, prn Imodium

## 2017-02-12 NOTE — Assessment & Plan Note (Signed)
R+L chins, right lateral lower leg skin tear should heal.

## 2017-02-12 NOTE — Progress Notes (Signed)
Location:  [facility] Room Number: [facility]:  SNF ([id number])  Provider: Arnice Vanepps, Manxie  NP  PCP: Renata Caprice, DO Patient Care Team: Renata Caprice, DO as PCP - General (Family Medicine) Elsie Stain, MD as Consulting Physician (Pulmonary Disease) Jerrik Housholder X, NP as Nurse Practitioner (Internal Medicine)  Extended Emergency Contact Information Primary Emergency Contact: Gregor Hams, [place] [address] Johnnette Litter of [place] Phone: [phone] Relation: Daughter Secondary Emergency Contact: Gallaher,Paul R Address: [address]          [place], [place] [address] Johnnette Litter of [place] Phone: [phone] Relation: Spouse  Code Status: DNR Goals of care:  Advanced Directive information Advanced Directives 02/12/2017  Does Patient Have a Medical Advance Directive? Yes  Type of Advance Directive Out of facility DNR (pink MOST or yellow form)  Does patient want to make changes to medical advance directive? No - Patient declined  Copy of [place] in Chart? -  Would patient like information on creating a medical advance directive? No - Patient declined  Pre-existing out of facility DNR order (yellow form or pink MOST form) Yellow form placed in chart (order not valid for inpatient use)     Allergies  Allergen Reactions  . Zithromax [Azithromycin] Diarrhea  . Iodine Other (See Comments)    Unknown, might be rash/hives  . Ivp Dye [Iodinated Diagnostic Agents] Other (See Comments)    Unknown rash/hives maybe  . Shellfish Allergy Other (See Comments)    Unknown rash/hives, scallops     Chief Complaint  Patient presents with  . Discharge Note    Back to [facility])    HPI:  83 y.o. female for discharge to [facility].      The patient was admitted to SNF for generalized weakness following her hospitalization 01/10/17 to 01/25/17 for bronchiectasis/COPD, acute  respiratory failure with hypoxia, she was fully treated with Tressie Ellis, Tobramycin for Pseudomonas growing out in sputum. She has Hx of COPE/broniectasis, taking Robitussin 97ml q4h prn, Flonase daily, Albuterol Neb q6h prn, tid, Pulmicort bid, Parkinson's disease, mainly voice change and swallowing per her husband, last ST eval: regular diet, but the patient does need fine chopped meats to eat,  takes Rasagiline 1mg  qd, Rotigotine 4mg /24hr patch, Carbidopa-Levodopa 48.75-295 mg qid.  Paget disease, Hx of colon cancer, HTN, controlled on Hydralazine 10mg  bid,  hypothyroidism, takes Levothyroxine 33mcg po qd. Dementia, resides in Memory care facility, taking Exelon 13.3mg /24hr patch   Past Medical History:  Diagnosis Date  . Bronchiectasis without acute exacerbation ([place]) 03/24/2013   No ct on file as of 04/29/2015    . Cancer ([place]) 04/16/2014     Overview:  colon    . Cancer of nipple and areola of female breast ([place]) 04/16/2014  . Colon cancer ([place])   . COPD (chronic obstructive pulmonary disease) ([place])   . Dementia   . Femur fracture ([place]) 11/04/2015  . H/O nutritional disorder 01/15/2013  . HCAP (healthcare-associated pneumonia) 07/16/2015  . Hernia of anterior abdominal wall 03/02/2013   Last Assessment & Plan:  Soft, reducible. Not causing her problems.  No need for further tx at this time   . Hip fracture, left ([place]) 11/04/2015  . Mammary Pagets disease ([place]) 04/16/2014  . Obstructive bronchiectasis ([place]) 04/29/2015   No pft's or ct's on file as of 04/29/2015    . Osteoporosis   .  Paget's bone disease   . Pagets disease, breast ([place])   . Parkinson's disease ([place])   . Pernicious anemia   . Renal disorder    Renal insufficiency  . Ventral hernia   . Vitamin D deficiency     Past Surgical History:  Procedure Laterality Date  . BREAST SURGERY Left   . COLON SURGERY     bowel resection  . HIP ARTHROPLASTY Left 11/04/2015   Procedure: ARTHROPLASTY BIPOLAR HIP (HEMIARTHROPLASTY);  Surgeon:  Tania Ade, MD;  Location: [facility];  Service: Orthopedics;  Laterality: Left;  . HIP PINNING,CANNULATED Left 11/04/2015   Procedure: CANNULATED HIP PINNING;  Surgeon: Tania Ade, MD;  Location: [facility];  Service: Orthopedics;  Laterality: Left;      reports that she has never smoked. She has never used smokeless tobacco. She reports that she drinks alcohol. She reports that she does not use drugs. Social History   Social History  . Marital status: Married    Spouse name: N/A  . Number of children: N/A  . Years of education: N/A   Occupational History  . Retired Licensed conveyancer     [place]   Social History Main Topics  . Smoking status: Never Smoker  . Smokeless tobacco: Never Used  . Alcohol use Yes     Comment: glass of wine maybe once a week  . Drug use: No  . Sexual activity: Not Currently   Other Topics Concern  . Not on file   Social History Narrative   Patient currently lives at a local skilled nursing facility in a dementia care unit. Husband lives with in same assisted-living community.   Admitted to [facility] 01/25/17   Married -Eddie Dibbles   Never smoked   Alcohol -one glass of wine a week   DNR   Functional Status Survey:    Allergies  Allergen Reactions  . Zithromax [Azithromycin] Diarrhea  . Iodine Other (See Comments)    Unknown, might be rash/hives  . Ivp Dye [Iodinated Diagnostic Agents] Other (See Comments)    Unknown rash/hives maybe  . Shellfish Allergy Other (See Comments)    Unknown rash/hives, scallops     Pertinent  Health Maintenance Due  Topic Date Due  . PNA vac Low Risk Adult (1 of 2 - PCV13) 02/24/1991  . INFLUENZA VACCINE  02/14/2017  . DEXA SCAN  Completed    Medications: Outpatient Encounter Prescriptions as of 02/12/2017  Medication Sig  . acetaminophen (TYLENOL) 650 MG suppository Place 1 suppository (650 mg total) rectally every 4 (four) hours as needed for fever.  Marland Kitchen albuterol (PROVENTIL) (2.5 MG/3ML) 0.083%  nebulizer solution Take 3 mLs (2.5 mg total) by nebulization every 6 (six) hours as needed for wheezing or shortness of breath.  Marland Kitchen albuterol (PROVENTIL) (2.5 MG/3ML) 0.083% nebulizer solution Take 3 mLs (2.5 mg total) by nebulization 3 (three) times daily.  . budesonide (PULMICORT) 0.25 MG/2ML nebulizer solution Take 0.25 mg by nebulization daily after breakfast.   . Calcium Citrate-Vitamin D 200-250 MG-UNIT TABS Take 3 tablets by mouth 2 (two) times daily.  . Carbidopa-Levodopa ER (RYTARY) 48.75-195 MG CPCR Take 1 capsule by mouth 4 (four) times daily. Takes 1 capsule by mouth 4 times a day at 0000, 0800, 1400 and 1800  . cholecalciferol (VITAMIN D) 1000 units tablet Take 3,000 Units by mouth daily with breakfast.   . ENSURE (ENSURE) Take 237 mLs by mouth 2 (two) times daily between meals. *Vanilla*  . fluticasone (FLONASE) 50 MCG/ACT nasal  spray Place 1 spray into both nostrils daily after breakfast.   . guaiFENesin-dextromethorphan (ROBITUSSIN DM) 100-10 MG/5ML syrup Take 5 mLs by mouth every 4 (four) hours as needed for cough.  . hydrALAZINE (APRESOLINE) 10 MG tablet Take 1 tablet (10 mg total) by mouth 2 (two) times daily.  Marland Kitchen levothyroxine (SYNTHROID, LEVOTHROID) 25 MCG tablet Take 1 tablet (25 mcg total) by mouth daily before breakfast.  . loperamide (IMODIUM) 2 MG capsule Take 2 mg by mouth daily as needed for diarrhea or loose stools.  . magic mouthwash SOLN Take 2 mLs by mouth 3 (three) times daily.  . ranitidine (ZANTAC) 75 MG tablet Take 75 mg by mouth 2 (two) times daily.  . rasagiline (AZILECT) 1 MG TABS tablet Take 1 mg by mouth daily at 2 PM.   . Rivastigmine (EXELON) 13.3 MG/24HR PT24 Place 1 patch onto the skin daily. Rotate sites  . rotigotine (NEUPRO) 4 MG/24HR Place 1 patch onto the skin daily. Remove old patch before applying new one.  . saccharomyces boulardii (FLORASTOR) 250 MG capsule Take 1 capsule (250 mg total) by mouth 2 (two) times daily.  Marland Kitchen senna-docusate (SENOKOT-S)  8.6-50 MG tablet Take 1 tablet by mouth at bedtime as needed for mild constipation.  . vitamin B-12 (CYANOCOBALAMIN) 500 MCG tablet Take 500 mcg by mouth daily with breakfast.   . Wheat Dextrin (BENEFIBER PO) Take 1 scoop by mouth daily with breakfast. Mix iIn 120 ml of fluid   No facility-administered encounter medications on file as of 02/12/2017.     Review of Systems  Constitutional: Negative for activity change, appetite change, chills, diaphoresis, fatigue and fever.       Frail, thin  HENT: Positive for voice change. Negative for congestion and hearing loss.        Chronic voice change from Parkinson's disease  Eyes: Negative for visual disturbance.  Respiratory: Positive for cough. Negative for choking and shortness of breath.        Chronic cough  Cardiovascular: Negative for chest pain, palpitations and leg swelling.  Gastrointestinal: Positive for diarrhea. Negative for abdominal distention, abdominal pain, constipation, nausea and vomiting.       Chronic diarrhea since colectomy for colon cancer 2004, Imodium needed. Needs fine chopped meat to swallow  Endocrine: Negative for polyuria.  Genitourinary: Negative for dysuria, frequency and urgency.  Musculoskeletal: Positive for gait problem. Negative for arthralgias, back pain and joint swelling.  Skin: Positive for wound.       The lateral right lower leg skin tear, no sign of infection. Multiple ecchymoses R+L shins.   Neurological: Positive for dizziness. Negative for tremors, facial asymmetry, speech difficulty, weakness and headaches.       Dizziness sometimes when change position too quick.   Hematological: Bruises/bleeds easily.  Psychiatric/Behavioral: Positive for confusion. Negative for agitation, behavioral problems, hallucinations and sleep disturbance. The patient is not nervous/anxious.     Vitals:   02/12/17 1448  BP: 115/65  Pulse: 72  Resp: 17  Temp: 98 F (36.7 C)  SpO2: 90%  Weight: 84 lb (38.1 kg)    Height: 4\' 9"  (1.448 m)   Body mass index is 18.18 kg/m. Physical Exam  Constitutional: She appears well-developed and well-nourished.  HENT:  Head: Normocephalic and atraumatic.  Eyes: Pupils are equal, round, and reactive to light. Conjunctivae and EOM are normal.  Neck: Normal range of motion. Neck supple.  Cardiovascular: Normal rate and regular rhythm.   Systolic murmur 2/6  Abdominal: Soft. Bowel sounds  are normal. She exhibits no distension. There is no tenderness.  Ventral incisional hernia, about a golf ball size, reducible.   Musculoskeletal: Normal range of motion. She exhibits no edema.  Severe kyphosis.   Neurological: She is alert. No cranial nerve deficit.  Oriented to self and husband.   Skin: Skin is warm and dry.  A skin tear at the lateral lower right leg, no s/s of infection. Multiple ecchymosis R+L shins.   Psychiatric: She has a normal mood and affect. Her behavior is normal.    Labs reviewed: Basic Metabolic Panel:  Recent Labs  01/20/17 0539 01/22/17 0527 01/24/17 0809  NA 140 139 139  K 3.8 3.7 3.7  CL 103 105 101  CO2 31 30 31   GLUCOSE 91 87 86  BUN 16 11 15   CREATININE 0.60 0.62 0.64  CALCIUM 7.9* 7.9* 8.6*   Liver Function Tests:  Recent Labs  01/11/17 0328  AST 11*  ALT 11*  ALKPHOS 58  BILITOT 0.9  PROT 6.5  ALBUMIN 2.9*   No results for input(s): LIPASE, AMYLASE in the last 8760 hours. No results for input(s): AMMONIA in the last 8760 hours. CBC:  Recent Labs  01/10/17 1226  01/20/17 0539 01/22/17 0527 01/24/17 0809  WBC 11.8*  < > 6.4 6.8 7.0  NEUTROABS 10.0*  --   --   --   --   HGB 11.1*  < > 9.1* 9.0* 9.4*  HCT 34.0*  < > 29.0* 28.5* 30.2*  MCV 90.9  < > 90.9 91.6 91.0  PLT 369  < > 394 438* 505*  < > = values in this interval not displayed. Cardiac Enzymes: No results for input(s): CKTOTAL, CKMB, CKMBINDEX, TROPONINI in the last 8760 hours. BNP: Invalid input(s): POCBNP CBG:  Recent Labs  01/23/17 0724  01/24/17 0730 01/25/17 0729  GLUCAP 76 78 70    Procedures and Imaging Studies During Stay: Dg Chest Port 1 View  Result Date: 01/23/2017 CLINICAL DATA:  Pneumonia EXAM: PORTABLE CHEST 1 VIEW COMPARISON:  January 19, 2017 FINDINGS: There is extensive airspace consolidation throughout the left mid lower lung zones. Compared to most recent study, there is now aeration of a portion of the left upper lobe. Right lung is mildly hyperexpanded but clear. There is shift of heart and mediastinum toward the left, stable. The heart size is within normal limits. Visualized pulmonary vascular is normal. There is aortic atherosclerosis. No evident adenopathy. Bones are osteoporotic. There is marked arthropathy in the right shoulder. IMPRESSION: Extensive consolidation throughout much the left lung, now with aeration of a portion of the left upper lobe. There remain shift of heart and mediastinum toward the left. Right lung hyperexpanded but clear. Stable cardiac silhouette. There is aortic atherosclerosis. There is advanced arthropathy in the right shoulder with chronic rotator cuff tear, characterized by superior migration of the right humeral head. Aortic Atherosclerosis (ICD10-I70.0). Electronically Signed   By: Lowella Grip III M.D.   On: 01/23/2017 07:11   Dg Chest Port 1 View  Result Date: 01/19/2017 CLINICAL DATA:  History of bronchiectasis and COPD. Shortness of breath. Pneumonia. EXAM: PORTABLE CHEST 1 VIEW COMPARISON:  01/18/2017. FINDINGS: Remote right rib trauma. Osteopenia. Patient chin overlies the left apex. Patient rotated left. Mild volume loss in the left hemithorax. Heart size not well evaluated. Atherosclerosis in the transverse aorta. Progressive whiteout of the left hemithorax. No pneumothorax. Probable trace right pleural fluid. Clear right lung. IMPRESSION: Progressive, now complete whiteout of the left hemithorax. This  likely due to a combination of pleural fluid and airspace disease. Given  progression since 01/10/2017, mucous plugging in the left endobronchial tree is also a consideration. Aortic Atherosclerosis (ICD10-I70.0). Electronically Signed   By: Abigail Miyamoto M.D.   On: 01/19/2017 09:40   Dg Chest Port 1 View  Result Date: 01/18/2017 CLINICAL DATA:  Hx of bronchiectasis. Hx of COPD. Pt is very SOB. Weakness. Not taking HTN meds. Not diabetic. Nonsmoker. No hx of CHF or AFIB. EXAM: PORTABLE CHEST 1 VIEW COMPARISON:  01/15/2017 FINDINGS: The patient's chin obscures the upper chest. There is persistent significant opacity throughout the left lung, with sparing of the lung apex. There is evidence for left lung volume loss. The appearance is similar to the prior study. Small right pleural effusion persists. Remote rib fractures are noted. Old right humerus fracture. IMPRESSION: Persistent significant opacity in the left lung, not significantly changed. Electronically Signed   By: Nolon Nations M.D.   On: 01/18/2017 11:08   Dg Chest Port 1 View  Result Date: 01/15/2017 CLINICAL DATA:  83 year old female respiratory failure. Subsequent encounter. EXAM: PORTABLE CHEST 1 VIEW COMPARISON:  01/13/2017 01/04/2016 2018 chest x-ray. FINDINGS: Almost complete consolidation left thorax with sparring of the left lung apex. Mediastinal shift to left. This may reflect atelectasis from mucous plug or central obstructing lesion. Post obstructive infiltrate or pleural effusion not excluded. No pneumothorax. Calcified aorta. IMPRESSION: Almost complete consolidation left thorax with sparring of the left lung apex. Mediastinal shift to left. This may reflect atelectasis from mucous plug or central obstructing lesion. Electronically Signed   By: Genia Del M.D.   On: 01/15/2017 06:49    Assessment/Plan:   Hypertension Controlled, continue Hydralazine 10mg  bid  Bronchiectasis without acute exacerbation (HCC) Stable, taking Robitussin 66ml q4h prn, Flonase daily, Albuterol Neb q6h prn, tid, Pulmicort  bid,  COPD (chronic obstructive pulmonary disease) (HCC) Stable, f/u Pulmonology in one week.   Chronic diarrhea Related to s/p colectomy, prn Imodium  Hypothyroidism Continue Levothyroxine 87mcg po daily.   Dementia resides in Memory care facility, taking Exelon 13.3mg /24hr patch   Parkinson's disease (HCC) Rasagiline 1mg  qd, Rotigotine 4mg /24hr patch, Carbidopa-Levodopa 48.75-295 mg qid. F/u Neurology in one week.   Osteoporosis Continue Vit D and Ca  Pernicious anemia Continue Vit B12, last Hgb 9.4 01/24/17  Hernia of anterior abdominal wall Stable, reducible  Ecchymosis R+L chins, right lateral lower leg skin tear should heal.   GERD (gastroesophageal reflux disease) Stable, continue Ranitidine      Patient is being discharged with the following home health services:    Patient is being discharged with the following durable medical equipment:    Patient has been advised to f/u with their PCP in 1-2 weeks to for a transitions of care visit.  Social services at their facility was responsible for arranging this appointment.  Pt was provided with adequate prescriptions of noncontrolled medications to reach the scheduled appointment .  For controlled substances, a limited supply was provided as appropriate for the individual patient.  If the pt normally receives these medications from a pain clinic or has a contract with another physician, these medications should be received from that clinic or physician only).    Future labs/tests needed:  None, f/u Pulmonology and Neurology. Discharge to Memory Care facility with palliative care

## 2017-02-12 NOTE — Assessment & Plan Note (Signed)
Continue Vit D and Ca

## 2017-02-12 NOTE — Assessment & Plan Note (Signed)
Stable, continue Ranitidine.  

## 2017-02-12 NOTE — Assessment & Plan Note (Signed)
Continue Vit B12, last Hgb 9.4 01/24/17

## 2017-02-21 ENCOUNTER — Ambulatory Visit (INDEPENDENT_AMBULATORY_CARE_PROVIDER_SITE_OTHER)
Admission: RE | Admit: 2017-02-21 | Discharge: 2017-02-21 | Disposition: A | Discharge status: Home / Self Care | Payer: Medicare Other | Source: Ambulatory Visit | Attending: Pulmonary Disease | Admitting: Pulmonary Disease

## 2017-02-21 ENCOUNTER — Ambulatory Visit (INDEPENDENT_AMBULATORY_CARE_PROVIDER_SITE_OTHER): Payer: Medicare Other | Admitting: Pulmonary Disease

## 2017-02-21 ENCOUNTER — Encounter: Payer: Self-pay | Admitting: Pulmonary Disease

## 2017-02-21 VITALS — BP 110/60 | HR 102 | Ht <= 58 in | Wt 77.8 lb

## 2017-02-21 DIAGNOSIS — J189 Pneumonia, unspecified organism: Secondary | ICD-10-CM | POA: Diagnosis not present

## 2017-02-21 DIAGNOSIS — J470 Bronchiectasis with acute lower respiratory infection: Secondary | ICD-10-CM | POA: Diagnosis not present

## 2017-02-21 DIAGNOSIS — J439 Emphysema, unspecified: Secondary | ICD-10-CM

## 2017-02-21 NOTE — Patient Instructions (Signed)
1.  We will check an XRAY this am and I will call you with the results 2.  I am so glad to see you are doing better!! 3.  Keep focusing on good nutrition and physical rehab efforts 4.  No medications or food while lying down.  Continue aspiration precautions.   5.  Call if you have new or worsening symptoms.

## 2017-02-21 NOTE — Progress Notes (Signed)
Note reviewed.  Sonia Baller Ashok Cordia, M.D. [facility] Pager:  [phone] After 3pm or if no response, call [phone] 7:45 PM 02/21/17

## 2017-02-21 NOTE — Progress Notes (Addendum)
[facility]   Chief Complaint  Patient presents with  . Hospitalization Follow-up    Pt was in the hospital 6/27 due to pneumonia. Was in the ICU for 10 days before being able to go to the main part of hospital and was in rehab after discharge. Pt has been out of rehab for about 1 week.  Pt has been doing good since discharge. Denies any cough, SOB, or CP.     Primary Pulmonologist: Dr. Ashok Cordia  Current Outpatient Prescriptions on File Prior to Visit  Medication Sig  . albuterol (PROVENTIL) (2.5 MG/3ML) 0.083% nebulizer solution Take 3 mLs (2.5 mg total) by nebulization 3 (three) times daily.  . budesonide (PULMICORT) 0.25 MG/2ML nebulizer solution Take 0.25 mg by nebulization daily after breakfast.   . Calcium Citrate-Vitamin D 200-250 MG-UNIT TABS Take 3 tablets by mouth 2 (two) times daily.  Marland Kitchen ENSURE (ENSURE) Take 237 mLs by mouth 2 (two) times daily between meals. *Vanilla*  . fluticasone (FLONASE) 50 MCG/ACT nasal spray Place 1 spray into both nostrils daily after breakfast.   . guaiFENesin-dextromethorphan (ROBITUSSIN DM) 100-10 MG/5ML syrup Take 5 mLs by mouth every 4 (four) hours as needed for cough.  . hydrALAZINE (APRESOLINE) 10 MG tablet Take 1 tablet (10 mg total) by mouth 2 (two) times daily.  Marland Kitchen levothyroxine (SYNTHROID, LEVOTHROID) 25 MCG tablet Take 1 tablet (25 mcg total) by mouth daily before breakfast.  . loperamide (IMODIUM) 2 MG capsule Take 2 mg by mouth daily as needed for diarrhea or loose stools.  . magic mouthwash SOLN Take 2 mLs by mouth 3 (three) times daily.  . ranitidine (ZANTAC) 75 MG tablet Take 75 mg by mouth 2 (two) times daily.  . rasagiline (AZILECT) 1 MG TABS tablet Take 1 mg by mouth daily at 2 PM.   . Rivastigmine (EXELON) 13.3 MG/24HR PT24 Place 1 patch onto the skin daily. Rotate sites  . rotigotine (NEUPRO) 4 MG/24HR Place 1 patch onto the skin daily. Remove old patch before applying new one.  . saccharomyces boulardii (FLORASTOR) 250 MG  capsule Take 1 capsule (250 mg total) by mouth 2 (two) times daily.  . vitamin B-12 (CYANOCOBALAMIN) 500 MCG tablet Take 500 mcg by mouth daily with breakfast.   . Wheat Dextrin (BENEFIBER PO) Take 1 scoop by mouth daily with breakfast. Mix iIn 120 ml of fluid  . Carbidopa-Levodopa ER (RYTARY) 48.75-195 MG CPCR Take 1 capsule by mouth 4 (four) times daily. Takes 1 capsule by mouth 4 times a day at 0000, 0800, 1400 and 1800  . cholecalciferol (VITAMIN D) 1000 units tablet Take 3,000 Units by mouth daily with breakfast.    No current facility-administered medications on file prior to visit.      Studies: CXR 8/8 >> images personally reviewed, significant improvement in prior left PNA with near clearing, residual left basilar airspace disease  Past Medical Hx:  has a past medical history of Bronchiectasis without acute exacerbation ([place]) (03/24/2013); Cancer ([place]) (04/16/2014); Cancer of nipple and areola of female breast ([place]) (04/16/2014); Colon cancer ([place]); COPD (chronic obstructive pulmonary disease) ([place]); Dementia; Femur fracture ([place]) (11/04/2015); H/O nutritional disorder (01/15/2013); HCAP (healthcare-associated pneumonia) (07/16/2015); Hernia of anterior abdominal wall (03/02/2013); Hip fracture, left ([place]) (11/04/2015); Mammary Pagets disease ([place]) (04/16/2014); Obstructive bronchiectasis ([place]) (04/29/2015); Osteoporosis; Paget's bone disease; Pagets disease, breast ([place]); Parkinson's disease ([place]); Pernicious anemia; Renal disorder; Ventral hernia; and Vitamin D deficiency.   Past Surgical hx, Allergies, Family hx, Social hx all reviewed.  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     Hospital course notable for near complete whiteout of the left hemithorax.  She was treated with pulmonary hygiene (pt leans to left / sleeps on left and had been getting nocturnal medications), IV fortaz and tobramycin for pseudomonas.  The patient initially required O2.  She was discharged to rehab and then back to her memory care unit.  Her husband and daughter accompany her today.  They report she is much improved.  No sputum production, shortness of breath etc.  Follow aspiration precautions.  O2 weaned off.     Physical Exam  General - frail elderlyl F in no acute distress, sitting in wheelchair, leaning to L ENT - No sinus tenderness, no oral exudate, no LAN Cardiac - s1s2 regular, no murmur Chest - even/non-labored, clear on R, left side with few scattered rhonchi posterior lower. No wheeze/rales Back - No focal tenderness Abd - Soft, non-tender Ext - warm/dry Neuro - Normal strength Skin - No rashes, BLE 1+ edema, skin tear on RLE with dressing intact Psych - normal mood, and behavior   Assessment/Plan  Discussion:  83 y/o F with Parkinson's Disease and advanced dementia with recent hospitalization for aspiration PNA (sputum positive for pseudomonas).  She has completed rehab and looks remarkably well given her appearance in the hospital.    Recent Pseudomonas PNA  Aspiration PNA  Bronchiectasis Parkinson's Disease with Advanced Dementia  Plan: Assess repeat CXR to ensure clearance of PNA Albuterol decreased to BID at husbands request  Reinforced aspiration precautions, no medications or food while lying down Encouraged nutritional supplements, rehab efforts  Follow up with Dr. Ashok Cordia   Patient Instructions  1.  We will check an XRAY this am and I will call you with the results 2.  I am so glad to see you are doing better!! 3.  Keep  focusing on good nutrition and physical rehab efforts 4.  No medications or food while lying down.  Continue aspiration precautions.   5.  Call if you have new or worsening symptoms.     Noe Gens, NP-C [facility]  [phone] 02/22/2017, 10:24 AM

## 2017-03-15 ENCOUNTER — Encounter: Payer: Self-pay | Admitting: Internal Medicine

## 2017-03-15 NOTE — Telephone Encounter (Signed)
I can't really give an official recommendation on her needs having not seen her for 1.5 years  What I can do is state based on her last ov she was using budesonide and appeared to have benfited from it then based on improved symptoms, reduced exacerbations.  Hope this helps!

## 2017-03-15 NOTE — Telephone Encounter (Signed)
Dr. Melvyn Novas, please advise if it is ok for [place] to write a letter of necessity for her Pulmicort. Thanks!

## 2017-05-24 ENCOUNTER — Ambulatory Visit: Admitting: Internal Medicine

## 2017-05-24 ENCOUNTER — Telehealth: Payer: Self-pay | Admitting: Internal Medicine

## 2017-05-24 ENCOUNTER — Encounter: Payer: Self-pay | Admitting: Internal Medicine

## 2017-05-24 VITALS — BP 122/80 | HR 72 | Ht <= 58 in | Wt 74.8 lb

## 2017-05-24 DIAGNOSIS — J479 Bronchiectasis, uncomplicated: Secondary | ICD-10-CM

## 2017-05-24 NOTE — Telephone Encounter (Signed)
Spoke with Shannon Dunn  She states needing written order stating how she is to use her albuterol neb sol based on today's ov  Order written, signed and faxed to the number provided

## 2017-05-24 NOTE — Patient Instructions (Addendum)
Continue budesonide 0.25 mg each am with albuterol  ( medicare part B)  And repeat the albuterol at bedtime and up to every 4 hours during the day as needed   Pulmonary follow up is as needed

## 2017-05-24 NOTE — Progress Notes (Signed)
Subjective:    Patient ID: Shannon Dunn, female    DOB: 10/17/1923,    MRN: [id number]   Brief patient profile:  89yowf never smoker with dementia and documented bronchiectasis followed previously followed by Dr Joya Gaskins. Recent hospitalization for HAP/lingular pneumonia / respiratory failure 06/2015.  Significant Studies./ Events       08/13/2015 NP ov Patient returns to the office today for two-week follow-up. Initial hospital follow-up visit 07/30/2015 chest x-ray indicated persistent lingula pneumonia. The patient was treated with an additional 7 days of Levaquin 500 mg with follow-up today for resolution in this very frail female. Family provide history due to patient's dementia .She is much more alert. Saturations are 99% on oxygen 2 L. Chest x-ray today indicates resolution of pneumonia. There are no clinical signs and symptoms of ongoing infection. Family states the patient no longer has cough, or greenish yellow sputum. She lives with her husband at an assisted living facility with close oversight and monitoring by their son and daughter. Oxygen saturations  when oxygen was decreased to 1 L nasal cannula at rest and remained 99%. I have discussed with family the need to be proactive and seek medical attention at the earliest sign that the patient may be developing a  respiratory infection in order to avoid future hospitalizations if possible. Patient continues to lose weight. An additional 2 pound weight loss since last visit 2 weeks ago. Encourage nutritional supplementation between meals if possible with boost. rec  You can stop the mucinex today. Your chest x ray shows the pneumonia has resolved. Call the office if there is a change in her chest congestion or if you notice increase in cough, fever, colored sputum. We would rather be proactive than reactive. We will check your oxygen today on 1 L at rest. Your saturation was 99% on 1 liter at rest. You can use 1 L at rest and  increase to 2 L  With activity or exertion. Please monitor saturations with activity. Goal is 88-90%. Flu vaccine today. Supplements with meals as you have lost an additional 2 pounds. Follow up office with Dr. Melvyn Novas in 1 month. Please contact office for sooner follow up if symptoms do not improve or worsen or seek emergency care       09/13/2015  f/u ov/Shannon Dunn re: obst bronchiectasis 02 2lpm 24/7  Chief Complaint  Patient presents with  . Follow-up    Pt c/o dizziness related to her breathing.   fm concerned 02 is hold her back from better mobilization and doesn't really need it. Not limited by breathing per fm but rather by weakness and unsteady on feet.  No recent fever or purulent sputum/ swallowing ok  rec Does not need oxygen at this point- we will call to cancel it  Need to use the flutter valve more     Admit date: 01/10/2017 Discharge date: 01/25/2017   Discharge Diagnoses:  Active Problems:   Bronchiectasis without acute exacerbation (HCC)   Dementia   Parkinson's disease ([place])   HCAP (healthcare-associated pneumonia)   Acute respiratory failure with hypoxia ([place])   Hypothyroidism   Malnutrition of moderate degree    Discharge Condition: stable   Diet recommendation: as tolerated    Hospital Course:   Active Problems: Acute respiratory failure with hypoxia in the setting of acute COPD exacerbation and bronchiectasis, mucus plugging and HCAP / Pseudomonas in sputum - CXR on 7/2 showed almost complete consolidation left thorax with sparring of the left lung apex. Mediastinal  shift to left, may reflect atelectasis from mucous plug or central obstructing lesion - CXR 7/5 showed persistent left lung opacity not significantly changed - Pseudomonas on sputum cx - Continue flutter valve - CXR 7/10 showed extensive consolidation throughout much the left lung, now with aeration of a portion of the left upper lobe. There remain shift of heart and mediastinum  toward the left.  - Has appt with pulmonary 7/27 at 10:45 am - Completed 7 days of fortaz and tobramycin   Parkinson's disease ([place]) - Continue carbidopa/levodopa  Hypothyroidism  - Continue synthroid   Hypokalemia - Supplemented  Essential hypertension - Continue hydralazine   Malnutrition of moderate degree - In the context of chronic illness, dementia - Diet as tolerated  Dementia - Stable   Forehead lesion - Outpt follow, was supposed to be removed but pt ended up in hospital so family to resch appt with derm      05/24/2017  f/u ov/Shannon Dunn re:  obst bronchiectasis / no 02 / not using flutter valve as rec  Chief Complaint  Patient presents with  . Hospitalization Follow-up    Breathing has improved. She is coughing with some white sputum.   living at [facility]  Still able to cross the room if holding on to something /someone  Bud is just in am with albuterol and the latter at hs by itself and does not ask for additional prn sabas  tol reg diet s restrictionsa but does note some dysphagia s cough flare p eat  Apparently sleeps fine  No obvious day to day or daytime variability or assoc purulent sputum or mucus plugs or hemoptysis or cp or chest tightness, subjective wheeze or overt sinus or hb symptoms. No unusual exp hx or h/o childhood pna/ asthma or knowledge of premature birth.  Sleeping ok flat without nocturnal  or early am exacerbation  of respiratory  c/o's or need for noct saba. Also denies any obvious fluctuation of symptoms with weather or environmental changes or other aggravating or alleviating factors except as outlined above   Current Allergies, Complete Past Medical History, Past Surgical History, Family History, and Social History were reviewed in [facility] record.  ROS  The following are not active complaints unless bolded Hoarseness, sore throat, dysphagia, dental problems, itching, sneezing,  nasal  congestion or discharge of excess mucus or purulent secretions, ear ache,   fever, chills, sweats, unintended wt loss or wt gain, classically pleuritic or exertional cp,  orthopnea pnd or leg swelling, presyncope, palpitations, abdominal pain, anorexia, nausea, vomiting, diarrhea  or change in bowel habits or change in bladder habits, change in stools or change in urine, dysuria, hematuria,  rash, arthralgias, visual complaints, headache, numbness, weakness or ataxia or problems with walking or coordination,  change in mood/affect or memory.        Current Meds  Medication Sig  . acetaminophen (TYLENOL) 160 MG/5ML liquid Take 15 mg/kg every 4 (four) hours as needed by mouth for fever.  Marland Kitchen albuterol (PROVENTIL) (2.5 MG/3ML) 0.083% nebulizer solution Take 3 mLs (2.5 mg total) by nebulization 3 (three) times daily. (Patient taking differently: Take 2.5 mg 2 (two) times daily by nebulization. )  . budesonide (PULMICORT) 0.25 MG/2ML nebulizer solution Take 0.25 mg by nebulization daily after breakfast.   . Calcium Citrate-Vitamin D 200-250 MG-UNIT TABS Take 3 tablets by mouth 2 (two) times daily.  Marland Kitchen ENSURE (ENSURE) Take 237 mLs by mouth 2 (two) times daily between meals. *Vanilla*  .  fluticasone (FLONASE) 50 MCG/ACT nasal spray Place 1 spray into both nostrils daily after breakfast.   . guaiFENesin-dextromethorphan (ROBITUSSIN DM) 100-10 MG/5ML syrup Take 5 mLs by mouth every 4 (four) hours as needed for cough.  . hydrALAZINE (APRESOLINE) 10 MG tablet Take 1 tablet (10 mg total) by mouth 2 (two) times daily.  Marland Kitchen loperamide (IMODIUM) 2 MG capsule Take 2 mg by mouth daily as needed for diarrhea or loose stools.  . magic mouthwash SOLN Take 2 mLs by mouth 3 (three) times daily.  . rasagiline (AZILECT) 1 MG TABS tablet Take 1 mg by mouth daily at 2 PM.   . Rivastigmine (EXELON) 13.3 MG/24HR PT24 Place 1 patch onto the skin daily. Rotate sites  . rotigotine (NEUPRO) 4 MG/24HR Place 1 patch onto the skin daily.  Remove old patch before applying new one.  . Wheat Dextrin (BENEFIBER PO) Take 1 scoop by mouth daily with breakfast. Mix iIn 120 ml of fluid         Objective:     Physical Exam:  Elderly wf nad in W/c - some rattle with coughing but all smiles today   05/24/2017       74   09/13/15 82 lb 12.8 oz (37.558 kg)  08/13/15 76 lb (34.473 kg)  07/30/15 80 lb 3.2 oz (36.378 kg)    Vital signs reviewed  - Note on arrival 02 sats  95% on RA     HEENT: nl dentition, turbinates bilaterally, and oropharynx. Nl external ear canals without cough reflex   NECK :  without JVD/Nodes/TM/ nl carotid upstrokes bilaterally   LUNGS: no acc muscle use,  Kyphotic chest with distant coarse insp and exp  rhonchi sym bilaterally    CV:  RRR  II-III/VI  SEM   no s3  or increase in P2, and no edema   ABD:  soft and nontender with nl inspiratory excursion in the supine position. No bruits or organomegaly appreciated, bowel sounds nl  MS:   ext warm without deformities, calf tenderness, cyanosis or clubbing No obvious joint restrictions   SKIN: warm and dry without lesions    NEURO:  alert, confused with details of care       I personally reviewed images and agree with radiology impression as follows:  CXR:   02/21/17 Significant improvement in the left sided pneumonia. There is persistent basilar opacity that may reflect residual infection, atelectasis or a combination. No new abnormalities.        Assessment & Plan:

## 2017-05-25 ENCOUNTER — Encounter: Payer: Self-pay | Admitting: Internal Medicine

## 2017-05-25 NOTE — Assessment & Plan Note (Addendum)
   I had an extended discussion with the patient/fm  reviewing all relevant studies completed to date including last admit  and  lasting 15 to 20 minutes of a 25 minute visit  Re following issues:  Back to baseline on a relatively conservative regimen but due to cognitive decline at risk of future asp events/ HCAP with limited cough mechanics > reviewed limitations of pulmonary meds/ interventions in this setting with husband and son and note approp under hospice care at this point so pulmonary f/u is prn    Each maintenance medication was reviewed in detail including most importantly the difference between maintenance and prns and under what circumstances the prns are to be triggered using an action plan format that is not reflected in the computer generated alphabetically organized AVS.    Please see AVS for specific instructions unique to this visit that I personally wrote and verbalized to the the pt in detail and then reviewed with pt  by my nurse highlighting any  changes in therapy recommended at today's visit to their plan of care.

## 2017-05-28 ENCOUNTER — Telehealth: Payer: Self-pay | Admitting: Internal Medicine

## 2017-05-28 MED ORDER — ALBUTEROL SULFATE (2.5 MG/3ML) 0.083% IN NEBU: 2.5000 mg | INHALATION_SOLUTION | RESPIRATORY_TRACT | 5 refills | Status: DC | PRN

## 2017-05-28 NOTE — Telephone Encounter (Signed)
Spoke with Loa Socks at [facility] living facility, states that order for states q4 prn- needs dx with clearer directions (to use for sob, wheezing).  Fax to [phone]   rx with clear directions printed and faxed as requested.  Nothing further needed.

## 2017-06-28 ENCOUNTER — Other Ambulatory Visit: Payer: Self-pay | Admitting: Internal Medicine

## 2017-06-28 MED ORDER — ALBUTEROL SULFATE (2.5 MG/3ML) 0.083% IN NEBU: 2.5000 mg | INHALATION_SOLUTION | RESPIRATORY_TRACT | 0 refills | Status: DC | PRN

## 2017-08-20 ENCOUNTER — Telehealth: Payer: Self-pay | Admitting: Internal Medicine

## 2017-08-20 ENCOUNTER — Other Ambulatory Visit: Payer: Self-pay | Admitting: Internal Medicine

## 2017-08-20 MED ORDER — ALBUTEROL SULFATE (2.5 MG/3ML) 0.083% IN NEBU: 2.5000 mg | INHALATION_SOLUTION | RESPIRATORY_TRACT | 0 refills | Status: DC | PRN

## 2017-08-20 MED ORDER — ALBUTEROL SULFATE (2.5 MG/3ML) 0.083% IN NEBU: 2.5000 mg | INHALATION_SOLUTION | RESPIRATORY_TRACT | 2 refills | Status: AC | PRN

## 2017-08-20 NOTE — Telephone Encounter (Signed)
Spoke with pt's husband. He is requesting a 90 day supply of albuterol neb solution to be sent in. Rx has been sent in. Nothing further was needed.

## 2017-09-15 ENCOUNTER — Emergency Department (HOSPITAL_COMMUNITY)
Admission: EM | Admit: 2017-09-15 | Discharge: 2017-09-15 | Disposition: A | Discharge status: Home / Self Care | Payer: Medicare Other | Attending: Emergency Medicine | Admitting: Emergency Medicine

## 2017-09-15 ENCOUNTER — Encounter (HOSPITAL_COMMUNITY): Payer: Self-pay

## 2017-09-15 ENCOUNTER — Emergency Department (HOSPITAL_COMMUNITY): Payer: Medicare Other

## 2017-09-15 ENCOUNTER — Other Ambulatory Visit: Payer: Self-pay

## 2017-09-15 DIAGNOSIS — Z85038 Personal history of other malignant neoplasm of large intestine: Secondary | ICD-10-CM | POA: Insufficient documentation

## 2017-09-15 DIAGNOSIS — G20 Parkinson's disease: Secondary | ICD-10-CM | POA: Diagnosis not present

## 2017-09-15 DIAGNOSIS — Z853 Personal history of malignant neoplasm of breast: Secondary | ICD-10-CM | POA: Diagnosis not present

## 2017-09-15 DIAGNOSIS — Z79899 Other long term (current) drug therapy: Secondary | ICD-10-CM | POA: Diagnosis not present

## 2017-09-15 DIAGNOSIS — F0390 Unspecified dementia without behavioral disturbance: Secondary | ICD-10-CM | POA: Insufficient documentation

## 2017-09-15 DIAGNOSIS — J449 Chronic obstructive pulmonary disease, unspecified: Secondary | ICD-10-CM | POA: Diagnosis not present

## 2017-09-15 DIAGNOSIS — E039 Hypothyroidism, unspecified: Secondary | ICD-10-CM | POA: Diagnosis not present

## 2017-09-15 DIAGNOSIS — R55 Syncope and collapse: Secondary | ICD-10-CM | POA: Diagnosis not present

## 2017-09-15 DIAGNOSIS — Z96642 Presence of left artificial hip joint: Secondary | ICD-10-CM | POA: Diagnosis not present

## 2017-09-15 DIAGNOSIS — J988 Other specified respiratory disorders: Secondary | ICD-10-CM

## 2017-09-15 DIAGNOSIS — R4182 Altered mental status, unspecified: Secondary | ICD-10-CM | POA: Diagnosis present

## 2017-09-15 DIAGNOSIS — I10 Essential (primary) hypertension: Secondary | ICD-10-CM | POA: Insufficient documentation

## 2017-09-15 DIAGNOSIS — J069 Acute upper respiratory infection, unspecified: Secondary | ICD-10-CM | POA: Diagnosis not present

## 2017-09-15 LAB — URINALYSIS, ROUTINE W REFLEX MICROSCOPIC
Bilirubin Urine: NEGATIVE
Glucose, UA: NEGATIVE mg/dL
Hgb urine dipstick: NEGATIVE
KETONES UR: NEGATIVE mg/dL
LEUKOCYTES UA: NEGATIVE
NITRITE: NEGATIVE
PH: 6 (ref 5.0–8.0)
Protein, ur: NEGATIVE mg/dL
Specific Gravity, Urine: 1.015 (ref 1.005–1.030)

## 2017-09-15 LAB — CBC
HCT: 34.3 % — ABNORMAL LOW (ref 36.0–46.0)
Hemoglobin: 10.7 g/dL — ABNORMAL LOW (ref 12.0–15.0)
MCH: 28.9 pg (ref 26.0–34.0)
MCHC: 31.2 g/dL (ref 30.0–36.0)
MCV: 92.7 fL (ref 78.0–100.0)
PLATELETS: 387 10*3/uL (ref 150–400)
RBC: 3.7 MIL/uL — ABNORMAL LOW (ref 3.87–5.11)
RDW: 16.5 % — AB (ref 11.5–15.5)
WBC: 7.3 10*3/uL (ref 4.0–10.5)

## 2017-09-15 LAB — COMPREHENSIVE METABOLIC PANEL
ALK PHOS: 57 U/L (ref 38–126)
ALT: 11 U/L — ABNORMAL LOW (ref 14–54)
ANION GAP: 7 (ref 5–15)
AST: 23 U/L (ref 15–41)
Albumin: 3.2 g/dL — ABNORMAL LOW (ref 3.5–5.0)
BUN: 17 mg/dL (ref 6–20)
CALCIUM: 8.7 mg/dL — AB (ref 8.9–10.3)
CHLORIDE: 104 mmol/L (ref 101–111)
CO2: 26 mmol/L (ref 22–32)
Creatinine, Ser: 0.71 mg/dL (ref 0.44–1.00)
GFR calc non Af Amer: >60 mL/min (ref >60)
Glucose, Bld: 97 mg/dL (ref 65–99)
POTASSIUM: 5.3 mmol/L — AB (ref 3.5–5.1)
SODIUM: 137 mmol/L (ref 135–145)
Total Bilirubin: 0.7 mg/dL (ref 0.3–1.2)
Total Protein: 7.5 g/dL (ref 6.5–8.1)

## 2017-09-15 LAB — I-STAT ARTERIAL BLOOD GAS, ED
Acid-Base Excess: 3 mmol/L — ABNORMAL HIGH (ref 0.0–2.0)
Bicarbonate: 29.3 mmol/L — ABNORMAL HIGH (ref 20.0–28.0)
O2 Saturation: 100 %
PCO2 ART: 52.4 mmHg — AB (ref 32.0–48.0)
Patient temperature: 98.6
TCO2: 31 mmol/L (ref 22–32)
pH, Arterial: 7.356 (ref 7.350–7.450)
pO2, Arterial: 381 mmHg — ABNORMAL HIGH (ref 83.0–108.0)

## 2017-09-15 LAB — I-STAT TROPONIN, ED: Troponin i, poc: 0 ng/mL (ref 0.00–0.08)

## 2017-09-15 LAB — I-STAT CG4 LACTIC ACID, ED: LACTIC ACID, VENOUS: 0.83 mmol/L (ref 0.5–1.9)

## 2017-09-15 LAB — AMMONIA: AMMONIA: 13 umol/L (ref 9–35)

## 2017-09-15 MED ORDER — SODIUM CHLORIDE 0.9 % IV BOLUS (SEPSIS)
500.0000 mL | Freq: Once | INTRAVENOUS | Status: AC
Start: 2017-09-15 — End: 2017-09-15
  Administered 2017-09-15: 500 mL via INTRAVENOUS

## 2017-09-15 MED ORDER — LEVOFLOXACIN 500 MG PO TABS: 500.0000 mg | ORAL_TABLET | Freq: Every day | ORAL | 0 refills | Status: AC

## 2017-09-15 MED ORDER — LEVOFLOXACIN 750 MG PO TABS
750.0000 mg | ORAL_TABLET | Freq: Once | ORAL | Status: AC
  Administered 2017-09-15: 750 mg via ORAL
  Filled 2017-09-15: qty 1

## 2017-09-15 MED ORDER — ALBUTEROL SULFATE (2.5 MG/3ML) 0.083% IN NEBU
5.0000 mg | INHALATION_SOLUTION | Freq: Once | RESPIRATORY_TRACT | Status: AC
  Administered 2017-09-15: 5 mg via RESPIRATORY_TRACT
  Filled 2017-09-15: qty 6

## 2017-09-15 NOTE — ED Provider Notes (Signed)
[facility] DEPARTMENT Provider Note   CSN: [id number] Arrival date & time: 09/15/17  1711     History   Chief Complaint Chief Complaint  Patient presents with  . Loss of Consciousness    HPI Shannon Dunn is a 84 y.o. female.  HPI  84 year old female presents emergency department from her facility history of COPD/bronchial stasis, Parkinson's disease, Paget's bone disease, colon cancer who presents with change in mental status possible syncope with last known baseline normal at 1200 today with hypoxia 88% noted by EMS.  Patient was placed on nonrebreather with appropriate oxygen saturation.  Husband is at the bedside stated perceived increased sleepiness/tiredness.  Past Medical History:  Diagnosis Date  . Bronchiectasis without acute exacerbation ([place]) 03/24/2013   No ct on file as of 04/29/2015    . Cancer ([place]) 04/16/2014     Overview:  colon    . Cancer of nipple and areola of female breast ([place]) 04/16/2014  . Colon cancer ([place])   . COPD (chronic obstructive pulmonary disease) ([place])   . Dementia   . Femur fracture ([place]) 11/04/2015  . H/O nutritional disorder 01/15/2013  . HCAP (healthcare-associated pneumonia) 07/16/2015  . Hernia of anterior abdominal wall 03/02/2013   Last Assessment & Plan:  Soft, reducible. Not causing her problems.  No need for further tx at this time   . Hip fracture, left ([place]) 11/04/2015  . Mammary Pagets disease ([place]) 04/16/2014  . Obstructive bronchiectasis ([place]) 04/29/2015   No pft's or ct's on file as of 04/29/2015    . Osteoporosis   . Paget's bone disease   . Pagets disease, breast ([place])   . Parkinson's disease ([place])   . Pernicious anemia   . Renal disorder    Renal insufficiency  . Ventral hernia   . Vitamin D deficiency     Patient Active Problem List   Diagnosis Date Noted  . GERD (gastroesophageal reflux disease) 02/12/2017  . Acute exacerbation of chronic obstructive pulmonary disease (COPD) ([place])  01/27/2017  . Pseudomonas infection 01/27/2017  . Hypertension 01/27/2017  . Chronic diarrhea 01/27/2017  . Malnutrition of moderate degree 01/11/2017  . COPD (chronic obstructive pulmonary disease) ([place]) 12/03/2015  . Ecchymosis 11/27/2015  . Hypothyroidism 11/14/2015  . Protein-calorie malnutrition, severe 11/05/2015  . Femur fracture ([place]) 11/04/2015  . Hip fracture, left ([place]) 11/04/2015  . Chronic respiratory failure with hypoxia ([place]) 09/13/2015  . Physical deconditioning 07/19/2015  . Acute respiratory failure with hypoxia ([place]) 07/18/2015  . HCAP (healthcare-associated pneumonia) 07/16/2015  . Obstructive bronchiectasis ([place]) 04/29/2015  . H/O malignant neoplasm of skin 12/24/2014  . Cancer ([place]) 04/16/2014  . Mammary Pagets disease ([place]) 04/16/2014  . Cancer of nipple and areola of female breast ([place]) 04/16/2014  . Bronchiectasis without acute exacerbation ([place]) 03/24/2013  . Pernicious anemia   . Dementia   . Osteoporosis   . Parkinson's disease ([place])   . Hernia of anterior abdominal wall 03/02/2013  . H/O nutritional disorder 01/15/2013  . Personal history of nutritional deficiency 01/15/2013    Past Surgical History:  Procedure Laterality Date  . BREAST SURGERY Left   . COLON SURGERY     bowel resection  . HIP ARTHROPLASTY Left 11/04/2015   Procedure: ARTHROPLASTY BIPOLAR HIP (HEMIARTHROPLASTY);  Surgeon: Tania Ade, MD;  Location: [facility];  Service: Orthopedics;  Laterality: Left;  . HIP PINNING,CANNULATED Left 11/04/2015   Procedure: CANNULATED HIP PINNING;  Surgeon: Tania Ade, MD;  Location: [facility];  Service: Orthopedics;  Laterality: Left;    OB History    No data available       Home Medications    Prior to Admission medications   Medication Sig Start Date End Date Taking? Authorizing Provider  albuterol (PROVENTIL) (2.5 MG/3ML) 0.083% nebulizer solution Take 3 mLs (2.5 mg total) by nebulization every 4 (four) hours as needed for wheezing or  shortness of breath. Patient taking differently: Take 2.5 mg by nebulization 2 (two) times daily.  08/20/17  Yes Tanda Rockers, MD  albuterol (PROVENTIL) (2.5 MG/3ML) 0.083% nebulizer solution Take 2.5 mg by nebulization every 4 (four) hours as needed for wheezing or shortness of breath.   Yes [provider]  budesonide (PULMICORT) 0.25 MG/2ML nebulizer solution Take 0.25 mg by nebulization daily after breakfast.    Yes [provider]  calcium citrate-vitamin D 500-500 MG-UNIT chewable tablet Chew 1 tablet by mouth 2 (two) times daily.    Yes [provider]  Carbidopa-Levodopa ER 48.75-195 MG CPCR Take 1 capsule by mouth 3 (three) times daily. 09/04/17  Yes [provider]  cholecalciferol (VITAMIN D) 1000 units tablet Take 3,000 Units by mouth daily.   Yes [provider]  ENSURE (ENSURE) Take 237 mLs by mouth 2 (two) times daily between meals. *Vanilla*   Yes [provider]  fluticasone (FLONASE) 50 MCG/ACT nasal spray Place 1 spray into both nostrils daily after breakfast.    Yes [provider]  GuaiFENesin (MUCUS RELIEF ADULT PO) Take 10 mLs by mouth 2 (two) times daily.   Yes [provider]  loperamide (IMODIUM) 2 MG capsule Take 2 mg by mouth daily.    Yes [provider]  ranitidine (ZANTAC) 75 MG tablet Take 75 mg by mouth 2 (two) times daily.   Yes [provider]  rasagiline (AZILECT) 1 MG TABS tablet Take 1 mg by mouth daily at 2 PM.    Yes [provider]  Rivastigmine (EXELON) 13.3 MG/24HR PT24 Place 1 patch onto the skin daily. Rotate sites   Yes [provider]  rotigotine (NEUPRO) 4 MG/24HR Place 1 patch onto the skin daily. Remove old patch before applying new one.   Yes [provider]  sulfamethoxazole-trimethoprim (BACTRIM,SEPTRA) 200-40 MG/5ML suspension Take 10 mLs by mouth daily.   Yes [provider]  Wheat Dextrin (BENEFIBER PO) Take 1 scoop by  mouth daily with breakfast. Mix iIn 120 ml of fluid   Yes [provider]  acetaminophen (TYLENOL) 160 MG/5ML liquid Take 320 mg by mouth every 4 (four) hours as needed for fever or pain.     [provider]  guaiFENesin-dextromethorphan (ROBITUSSIN DM) 100-10 MG/5ML syrup Take 5 mLs by mouth every 4 (four) hours as needed for cough. 01/25/17   Robbie Lis, MD  hydrALAZINE (APRESOLINE) 10 MG tablet Take 1 tablet (10 mg total) by mouth 2 (two) times daily. Patient not taking: Reported on 09/15/2017 01/25/17   Robbie Lis, MD  levofloxacin (LEVAQUIN) 500 MG tablet Take 1 tablet (500 mg total) by mouth daily. 09/15/17   Willette Alma, DO  magic mouthwash SOLN Take 2 mLs by mouth 3 (three) times daily. Patient taking differently: Take 2 mLs by mouth 3 (three) times daily as needed for mouth pain.  01/25/17   Robbie Lis, MD    Family History Family History  Problem Relation Age of Onset  . Heart disease Mother   . Alzheimer's disease Father     Social History Social History  Tobacco Use  . Smoking status: Never Smoker  . Smokeless tobacco: Never Used  Substance Use Topics  . Alcohol use: Yes    Comment: glass of wine maybe once a week  . Drug use: No     Allergies   Zithromax [azithromycin]; Iodine; Ivp dye [iodinated diagnostic agents]; and Shellfish allergy   Review of Systems Review of Systems  Review of Systems  Constitutional: Negative for fever and chills.  HENT: Negative for ear pain, sore throat and trouble swallowing.   Eyes: Negative for pain and visual disturbance.  Respiratory: Negative for cough and shortness of breath.   Cardiovascular: Negative for chest pain and leg swelling.  Gastrointestinal: Negative for nausea, vomiting, abdominal pain and diarrhea.  Genitourinary: Negative for dysuria, urgency and frequency.  Musculoskeletal: Negative for back pain and joint swelling.  Skin: Negative for rash and wound.  Neurological: Negative for  dizziness, syncope, speech difficulty, weakness and numbness.   Physical Exam Updated Vital Signs BP 121/77   Pulse 79   Temp 97.8 F (36.6 C) (Tympanic)   Resp 13   Wt 33.6 kg (74 lb)   SpO2 94%   BMI 17.84 kg/m   Physical Exam  Physical Exam Vitals:   09/15/17 1736 09/15/17 1900  BP:  121/77  Pulse:  79  Resp:  13  Temp:    SpO2: 100% 94%   Constitutional: Patient is in no acute distress Head: Normocephalic and atraumatic.  Eyes: Extraocular motion intact, no scleral icterus Neck: Supple without meningismus, mass, or overt JVD Respiratory: Effort normal and breath sounds normal. No respiratory distress. CV: Heart regular rate and rhythm, no obvious murmurs.  Pulses +2 and symmetric Abdomen: Soft, non-tender, non-distended MSK: Extremities are atraumatic without deformity, ROM intact Skin: Warm, dry, intact Neuro: GCS 14; neg focal deficit noted Psychiatric: Mood and affect are normal per husband currently  ED Treatments / Results  Labs (all labs ordered are listed, but only abnormal results are displayed) Labs Reviewed  CBC - Abnormal; Notable for the following components:      Result Value   RBC 3.70 (*)    Hemoglobin 10.7 (*)    HCT 34.3 (*)    RDW 16.5 (*)    All other components within normal limits  COMPREHENSIVE METABOLIC PANEL - Abnormal; Notable for the following components:   Potassium 5.3 (*)    Calcium 8.7 (*)    Albumin 3.2 (*)    ALT 11 (*)    All other components within normal limits  I-STAT ARTERIAL BLOOD GAS, ED - Abnormal; Notable for the following components:   pCO2 arterial 52.4 (*)    pO2, Arterial 381.0 (*)    Bicarbonate 29.3 (*)    Acid-Base Excess 3.0 (*)    All other components within normal limits  URINALYSIS, ROUTINE W REFLEX MICROSCOPIC  AMMONIA  I-STAT TROPONIN, ED  I-STAT CG4 LACTIC ACID, ED    EKG  EKG Interpretation  Date/Time:  Saturday September 15 2017 17:19:20 EST Ventricular Rate:  92 PR Interval:    QRS  Duration: 80 QT Interval:  347 QTC Calculation: 430 R Axis:   136 Text Interpretation:  Sinus rhythm Right axis deviation Nonspecific T wave abnormality Confirmed by Lajean Saver [id number]) on 09/15/2017 7:04:21 PM       Radiology Dg Chest Port 1 View  Result Date: 09/15/2017 CLINICAL DATA:  84 year old female with history of unresponsiveness. EXAM: PORTABLE CHEST 1 VIEW COMPARISON:  Chest x-ray 02/21/2017. FINDINGS: Left lower lobe consolidation  with small left pleural effusion. Diffuse bronchial wall thickening with patchy areas of interstitial prominence, similar to prior studies. No evidence of pulmonary edema. Heart size is normal. The patient is rotated to the left on today's exam, resulting in distortion of the mediastinal contours and reduced diagnostic sensitivity and specificity for mediastinal pathology. Old healed fracture of the posterior aspect of the left fifth rib. IMPRESSION: 1. Persistent left lower lobe pneumonia with small left pleural effusion. Given that this is similar to prior studies, the possibility of a centrally obstructing neoplasm should be considered. Accordingly, follow-up nonemergent chest CT with contrast should be considered in the near future to better evaluate these findings if clinically appropriate in light of the patient's advanced age and other clinical status. 2. Aortic atherosclerosis. Electronically Signed   By: Vinnie Langton M.D.   On: 09/15/2017 17:34    Procedures Procedures (including critical care time)  Medications Ordered in ED Medications  levofloxacin (LEVAQUIN) tablet 750 mg (not administered)  sodium chloride 0.9 % bolus 500 mL (500 mLs Intravenous New Bag/Given 09/15/17 1803)  albuterol (PROVENTIL) (2.5 MG/3ML) 0.083% nebulizer solution 5 mg (5 mg Nebulization Given 09/15/17 1803)     Initial Impression / Assessment and Plan / ED Course  I have reviewed the triage vital signs and the nursing notes.  Pertinent labs & imaging results that  were available during my care of the patient were reviewed by me and considered in my medical decision making (see chart for details).     84 year old female presents emergency department from her facility history of COPD/bronchial stasis, Parkinson's disease, Paget's bone disease, colon cancer who presents with change in mental status possible syncope with last known baseline normal at 1200 today with hypoxia 88% noted by EMS.  Patient was placed on nonrebreather with appropriate oxygen saturation.  Husband is at the bedside stated perceived increased sleepiness/tiredness.  Review of patient's labs shows no evidence of leukocytosis, hemoglobin 10.7, lactic acid 0.83, first troponin undetectable, no findings on EKG concerning for ACS, pneumonia 13, negative discrete electrolyte imbalance aside from hyperkalemia 5.3 again no EKG changes, urine profile not concerning for infection, chest x-ray possible respiratory infection.  Patient was given 1 dose of 750 mg Levaquin in the emergency department.  Initial disposition was to be admitted to the hospitalist group for 24-hour observation.  Upon reevaluation the patient she is well-appearing and baseline functional status/cognitively good oxygen saturation 97%.  My recommendation would still be admission but family members feel strongly for discharge home.  Spoke with shared decision making amongst the daughter, husband and hospitalist and have agreed upon close follow-up to her pulmonologist within the next 24-48 hours along with p.o. antibiotics outpatient and good return precautions.  Patient is being discharged home with 10-day course of Levaquin.     Final Clinical Impressions(s) / ED Diagnoses   Final diagnoses:  Syncope, unspecified syncope type  Respiratory infection    ED Discharge Orders        Ordered    levofloxacin (LEVAQUIN) 500 MG tablet  Daily     09/15/17 2029       Willette Alma, DO 09/15/17 2033    Lajean Saver,  MD 09/15/17 2256

## 2017-09-15 NOTE — ED Triage Notes (Signed)
Pt presents to the ed with ems from [facility] for being unresponsive.  The patient was LSN at 1200 talking and eating lunch. Presents responsive to pain and following commands somnolent.

## 2017-09-15 NOTE — Discharge Instructions (Signed)
Return to the emergency department if loss of consciousness, fever, worsening of condition or as needed.

## 2017-10-05 ENCOUNTER — Telehealth: Payer: Self-pay | Admitting: Internal Medicine

## 2017-10-05 MED ORDER — BUDESONIDE 0.25 MG/2ML IN SUSP: 0.2500 mg | Freq: Every day | RESPIRATORY_TRACT | 1 refills | Status: AC

## 2017-10-05 NOTE — Telephone Encounter (Signed)
Spoke with pt's son Eddie Dibbles, requesting 90 day refill on Budesonide.  This has been sent to preferred pharmacy.  Nothing further needed.

## 2017-10-05 NOTE — Telephone Encounter (Signed)
Pt is calling back [phone]

## 2017-10-05 NOTE — Telephone Encounter (Signed)
lmtcb for [facility]. We have never prescribed symbicort for this patient.

## 2017-11-14 DEATH — deceased

## 2018-03-29 IMAGING — DX DG CHEST 1V PORT
1 series · 1 of 1 positions shown · non-contrast
Comparison: 01/15/2017

CLINICAL DATA: Hx of bronchiectasis. Hx of COPD. Pt is very SOB.
Weakness. Not taking HTN meds. Not diabetic. Nonsmoker. No hx of CHF
or AFIB.

EXAM:
PORTABLE CHEST 1 VIEW

[chest ap]
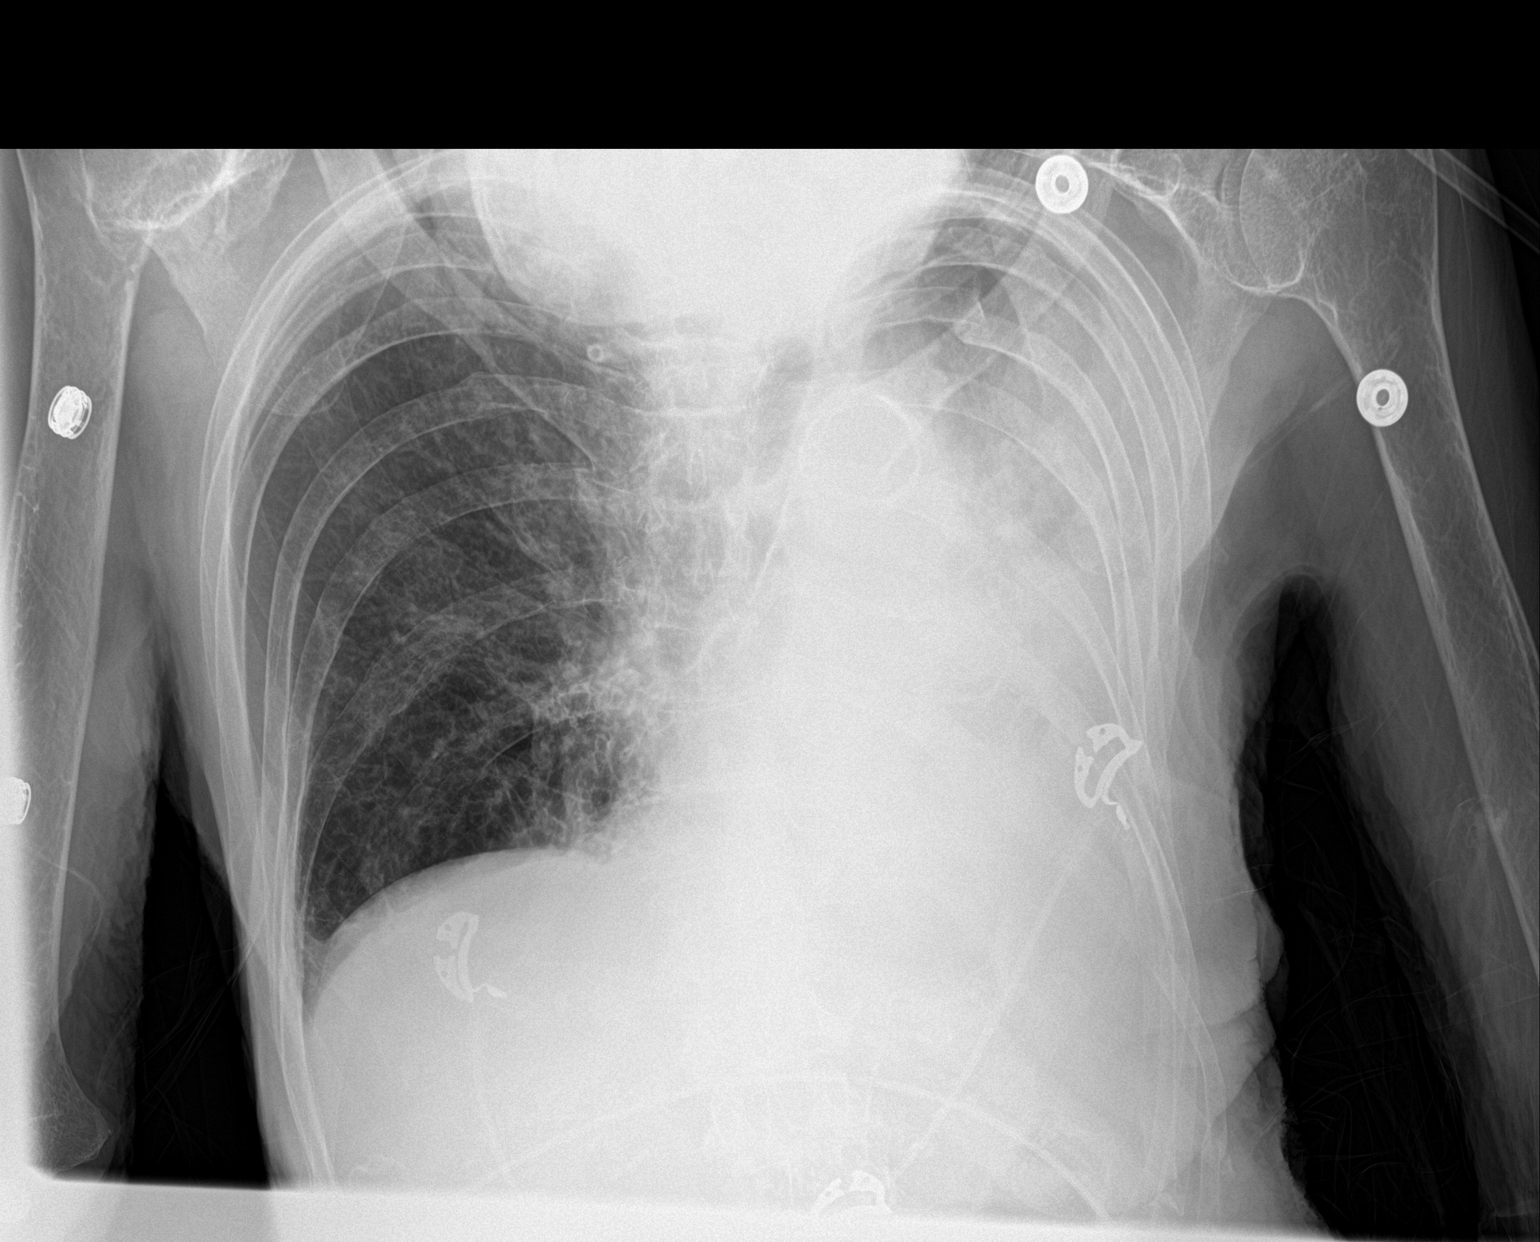

[1 of 1 positions shown; findings below may reference images not displayed]

FINDINGS: The patient's chin obscures the upper chest. There is persistent
significant opacity throughout the left lung, with sparing of the
lung apex. There is evidence for left lung volume loss. The
appearance is similar to the prior study. Small right pleural
effusion persists. Remote rib fractures are noted. Old right humerus
fracture.
IMPRESSION: Persistent significant opacity in the left lung, not significantly
changed.
